# Patient Record
Sex: Female | Born: 1980 | Race: White | Hispanic: No | Marital: Married | Smoking: Former smoker
Health system: Southern US, Community
[De-identification: ages and names within clinical notes are randomized; demographics above are authoritative.]

## PROBLEM LIST (undated history)

## (undated) DIAGNOSIS — M1990 Unspecified osteoarthritis, unspecified site: Secondary | ICD-10-CM

## (undated) DIAGNOSIS — Z8759 Personal history of other complications of pregnancy, childbirth and the puerperium: Secondary | ICD-10-CM

## (undated) DIAGNOSIS — Z9889 Other specified postprocedural states: Secondary | ICD-10-CM

## (undated) DIAGNOSIS — Z789 Other specified health status: Secondary | ICD-10-CM

## (undated) DIAGNOSIS — M069 Rheumatoid arthritis, unspecified: Secondary | ICD-10-CM

## (undated) DIAGNOSIS — Z9079 Acquired absence of other genital organ(s): Secondary | ICD-10-CM

## (undated) DIAGNOSIS — K08409 Partial loss of teeth, unspecified cause, unspecified class: Secondary | ICD-10-CM

## (undated) HISTORY — PX: WISDOM TOOTH EXTRACTION: SHX21

## (undated) HISTORY — DX: Unspecified osteoarthritis, unspecified site: M19.90

---

## 2000-02-07 ENCOUNTER — Other Ambulatory Visit: Admission: RE | Admit: 2000-02-07 | Discharge: 2000-02-07 | Payer: Self-pay | Admitting: Obstetrics and Gynecology

## 2000-05-25 ENCOUNTER — Emergency Department (HOSPITAL_COMMUNITY): Admission: EM | Admit: 2000-05-25 | Discharge: 2000-05-25 | Payer: Self-pay | Admitting: Emergency Medicine

## 2001-01-08 ENCOUNTER — Other Ambulatory Visit: Admission: RE | Admit: 2001-01-08 | Discharge: 2001-01-08 | Payer: Self-pay | Admitting: Obstetrics & Gynecology

## 2001-08-06 ENCOUNTER — Encounter (INDEPENDENT_AMBULATORY_CARE_PROVIDER_SITE_OTHER): Payer: Self-pay

## 2001-08-06 ENCOUNTER — Inpatient Hospital Stay (HOSPITAL_COMMUNITY): Admission: AD | Admit: 2001-08-06 | Discharge: 2001-08-08 | Payer: Self-pay | Admitting: Obstetrics and Gynecology

## 2002-01-02 ENCOUNTER — Other Ambulatory Visit: Admission: RE | Admit: 2002-01-02 | Discharge: 2002-01-02 | Payer: Self-pay | Admitting: Obstetrics and Gynecology

## 2003-07-29 ENCOUNTER — Other Ambulatory Visit: Admission: RE | Admit: 2003-07-29 | Discharge: 2003-07-29 | Payer: Self-pay | Admitting: Obstetrics & Gynecology

## 2004-08-01 ENCOUNTER — Other Ambulatory Visit: Admission: RE | Admit: 2004-08-01 | Discharge: 2004-08-01 | Payer: Self-pay | Admitting: Obstetrics & Gynecology

## 2005-08-11 ENCOUNTER — Other Ambulatory Visit: Admission: RE | Admit: 2005-08-11 | Discharge: 2005-08-11 | Payer: Self-pay | Admitting: Obstetrics & Gynecology

## 2006-07-03 ENCOUNTER — Ambulatory Visit: Payer: Self-pay | Admitting: Internal Medicine

## 2009-07-22 ENCOUNTER — Inpatient Hospital Stay (HOSPITAL_COMMUNITY): Admission: RE | Admit: 2009-07-22 | Discharge: 2009-07-24 | Payer: Self-pay | Admitting: Obstetrics & Gynecology

## 2011-01-26 LAB — CBC
HCT: 29 % — ABNORMAL LOW (ref 36.0–46.0)
Hemoglobin: 10 g/dL — ABNORMAL LOW (ref 12.0–15.0)
MCHC: 34.4 g/dL (ref 30.0–36.0)
RBC: 3.1 MIL/uL — ABNORMAL LOW (ref 3.87–5.11)

## 2011-01-26 LAB — CCBB MATERNAL DONOR DRAW

## 2011-01-27 LAB — CBC
Hemoglobin: 12.1 g/dL (ref 12.0–15.0)
MCHC: 34 g/dL (ref 30.0–36.0)
MCV: 92.9 fL (ref 78.0–100.0)
RBC: 3.85 MIL/uL — ABNORMAL LOW (ref 3.87–5.11)

## 2011-03-10 NOTE — Assessment & Plan Note (Signed)
[facility]                          [place] OFFICE NOTE   NAME:Gorley, Berenice Primas                      MRN:          [id number]  DATE:07/03/2006                            DOB:          Feb 10, 1981    CHIEF COMPLAINT:  Fatigue.   HISTORY OF PRESENT ILLNESS:  Mrs. Phillippi is a 30 year old female who came  to the office because she has been feeling dizzy, fatigued, shaky, and she  feels that this is probably from low blood sugar.  From time to time, her  chest feels tight, but she denies any chest pain or shortness of breath.  Most of her symptoms were more noticeable when she is fasting, and they  somehow resolve after she eats.  She has also noticed some nausea for two  weeks but without vomiting, abdominal  pain, diarrhea.  She  is not taking  any NSAIDS over the counter.  Urine pregnancy test today was negative.  On  further questioning, she believes that she has been feeling this way as far  as she can remember, but only in the last two weeks when the symptoms were a  little worse, her husband asked her to come here to get evaluated.  She is  G1, P1, no other medical conditions.   PAST MEDICAL HISTORY:  1. The patient denies any history of colon cancer, breast cancer or heart      attacks.  2. Grandmother has diabetes.   SOCIAL HISTORY:  The patient does not smoke, drink.  She is married, has a 14-  year-old daughter.   REVIEW OF SYSTEMS:  Per the history of the present illness, in addition to  that she does not cough.  She does have headaches from time to time but  nothing intense.  Her stress level is unchanged from the baseline.  Her  periods are normal.   MEDICATIONS:  Birth control pills.   ALLERGIES:  NO KNOWN DRUG ALLERGIES.   PHYSICAL EXAMINATION:  GENERAL:  The patient is alert, oriented, in no  apparent distress.  She is 5 feet 4 inches and 1/4 tall.  VITAL SIGNS:  She weighs 132 pounds, blood pressure 108/70.  NECK:   No thyromegaly.  LUNGS:  Clear to auscultation.  CARDIOVASCULAR:  Regular rate and rhythm without a murmur.  ABDOMEN:  Not distended, soft, good bowel sounds, no organomegaly.  EXTREMITIES:  No edema.  NEUROLOGICAL:  Speech, gait and motor are intact.   LABORATORY AND X-RAYS:  Pregnancy test is negative, blood pressure 91.   ASSESSMENT/PLAN:  Mrs. Strother presents today with very vague symptoms of  fatigue, dizziness and also shakiness.  Her sugar today is normal.  Hypoglycemia is uncommon.  It certainly could explain some of her symptoms.  At this point; however, I recommend her to check a TSH, CBC and a  comprehensive panel.  If all of that is abnormal, we should wait a couple of  weeks and see how she feels.  If she continues to feeling poorly, she is to  let me know.  My next step could be to give her a  glucometer and try to  recommend hypoglycemia when she has the symptoms.  She seems to understand  my plan.                                   Willow Ora, MD   JP/MedQ  DD:  07/03/2006  DT:  07/04/2006  Job #:  [id number]

## 2011-03-10 NOTE — Discharge Summary (Signed)
[facility]  Patient:    Dawn [name], Dawn [name] Visit Number: [id number] MRN: [id number]          Service Type: OBS Location: 910A [id number] Attending Physician:  Melony Overly Admit Date:  08/06/2001 Discharge Date: 08/08/2001                             Discharge Summary  DISCHARGE DIAGNOSES:          1. Term pregnancy.                               2. Delivered 7 pound 14 ounce female infant                                  Apgars 9 and 9.                               3. Blood type O positive.                               4. Intrapartum fever.  PROCEDURE:                    1. Normal spontaneous vaginal delivery.                               2. Episiotomy with partial third degree                                  extension and repair.                               3. Intrapartum ampicillin.  HISTORY OF PRESENT ILLNESS:   This primigravida 30 year old female was admitted at term in early labor.  At the time of admission her cervix was 4 cm dilated with a vertex at -1 station.  HOSPITAL COURSE:              Amniotomy was carried out with production of clear fluid and the patient went into labor.  She did have an intrapartum temperature elevation to 100.5 and received ampicillin during labor.  She subsequently had a normal spontaneous vaginal delivery of a viable female infant weighing 7 pounds 14 ounces over a midline episiotomy that partially extended to a third degree.  Apgars were 9 and 9.  Episiotomy was repaired and subsequent postpartum course was totally benign.  On the morning of October 17, she was ambulating well, tolerating a regular diet well, was afebrile. Her Her discomfort was controlled with ibuprofen.  Her breastfeeding was going well and she was discharged to home. Discharge hemoglobin was 10.4 with a white count of 11,700, and platelet count 189,000.  She was given all appropriate instructions at the time of discharge and  understood all instructions well.  DISCHARGE MEDICATIONS:        1. Ferrous sulfate 300 mg daily.  2. Continue prenatal vitamins one a day as                                  long as she is breastfeeding.                               3. Ibuprofen for discomfort.  FOLLOW-UP:                    She will return to the office in four weeks for follow-up.  CONDITION ON DISCHARGE:       Improved. Attending Physician:  Melony Overly DD:  08/08/01 TD:  08/09/01 Job: [id number]

## 2013-05-02 ENCOUNTER — Inpatient Hospital Stay (HOSPITAL_COMMUNITY)
Admission: AD | Admit: 2013-05-02 | Discharge: 2013-05-02 | Disposition: A | Discharge status: Home / Self Care | Payer: BC Managed Care – PPO | Source: Ambulatory Visit | Attending: Obstetrics and Gynecology | Admitting: Obstetrics and Gynecology

## 2013-05-02 ENCOUNTER — Encounter (HOSPITAL_COMMUNITY): Payer: Self-pay | Admitting: Family

## 2013-05-02 ENCOUNTER — Inpatient Hospital Stay (HOSPITAL_COMMUNITY): Payer: BC Managed Care – PPO

## 2013-05-02 DIAGNOSIS — O4690 Antepartum hemorrhage, unspecified, unspecified trimester: Secondary | ICD-10-CM

## 2013-05-02 DIAGNOSIS — R109 Unspecified abdominal pain: Secondary | ICD-10-CM | POA: Insufficient documentation

## 2013-05-02 DIAGNOSIS — O209 Hemorrhage in early pregnancy, unspecified: Secondary | ICD-10-CM

## 2013-05-02 HISTORY — DX: Other specified health status: Z78.9

## 2013-05-02 LAB — CBC WITH DIFFERENTIAL/PLATELET
Eosinophils Absolute: 0.1 10*3/uL (ref 0.0–0.7)
Eosinophils Relative: 2 % (ref 0–5)
Hemoglobin: 12.7 g/dL (ref 12.0–15.0)
Lymphs Abs: 2.7 10*3/uL (ref 0.7–4.0)
MCH: 31.5 pg (ref 26.0–34.0)
MCV: 89.6 fL (ref 78.0–100.0)
Monocytes Absolute: 0.5 10*3/uL (ref 0.1–1.0)
Monocytes Relative: 7 % (ref 3–12)
RBC: 4.03 MIL/uL (ref 3.87–5.11)

## 2013-05-02 NOTE — MAU Note (Signed)
Patient presents to MAU to r/o ectopic per office visit today; reports hcg levels taken in office on Monday, Wednesday and again today. Reports brown spotting x 2 days.  Reports intermittent sharp pains on R lower abdomen.

## 2013-05-02 NOTE — MAU Provider Note (Signed)
History     CSN: [id number]  Arrival date and time: 05/02/13 2004   First Provider Initiated Contact with Patient 05/02/13 2044      Chief Complaint  Patient presents with  . Abdominal Pain   HPI [name] is a 32 y.o. G3P2002 at [redacted]w[redacted]d who was sent to MAU today for possible ectopic pregnancy. The patient was seen in the office on Monday and quant hCG was ~2300 per patient. The patient reports and [place] that day that showed nothing in the uterus. The patient then returned to the office Wednesday and had quant hCG of 1800 and today it was 2600. She has had brown spotting x 2 days and off and on lower abdominal cramping. She rates her pain at 4/10 at the worst and has no pain now. She denies N/V, fever, dizziness or UTI symptoms. She has had more frequent BMs over the last few days.   OB History   Grav Para Term Preterm Abortions TAB SAB Ect Mult Living   3 2 2       2       Past Medical History  Diagnosis Date  . Medical history non-contributory     Past Surgical History  Procedure Laterality Date  . Wisdom tooth extraction      History reviewed. No pertinent family history.  History  Substance Use Topics  . Smoking status: Never Smoker   . Smokeless tobacco: Never Used  . Alcohol Use: No    Allergies: No Known Allergies  No prescriptions prior to admission    Review of Systems  Constitutional: Negative for fever and malaise/fatigue.  Gastrointestinal: Positive for abdominal pain and diarrhea. Negative for nausea, vomiting and constipation.  Genitourinary: Negative for dysuria, urgency and frequency.       + vaginal bleeding  Neurological: Negative for dizziness, loss of consciousness and weakness.   Physical Exam   Blood pressure 104/52, pulse 77, temperature 98.4 F (36.9 C), temperature source Oral, resp. rate 16, height 5\' 5"  (1.651 m), weight 141 lb 9.6 oz (64.229 kg), last menstrual period 03/28/2013.  Physical Exam  Constitutional: She is oriented  to person, place, and time. She appears well-developed and well-nourished. No distress.  HENT:  Head: Normocephalic and atraumatic.  Cardiovascular: Normal rate, regular rhythm and normal heart sounds.   Respiratory: Effort normal and breath sounds normal. No respiratory distress.  GI: Soft. Bowel sounds are normal. She exhibits no distension and no mass. There is no tenderness. There is no rebound and no guarding.  Neurological: She is alert and oriented to person, place, and time.  Skin: Skin is warm and dry. No erythema.  Psychiatric: She has a normal mood and affect.   Results for orders placed during the hospital encounter of 05/02/13 (from the past 24 hour(s))  POCT PREGNANCY, URINE     Status: Abnormal   Collection Time    05/02/13  8:29 PM      Result Value Range   Preg Test, Ur POSITIVE (*) NEGATIVE  HCG, QUANTITATIVE, PREGNANCY     Status: Abnormal   Collection Time    05/02/13  9:54 PM      Result Value Range   hCG, Beta Chain, Quant, S 1979 (*) <5 mIU/mL  CBC WITH DIFFERENTIAL     Status: None   Collection Time    05/02/13  9:54 PM      Result Value Range   WBC 7.1  4.0 - 10.5 K/uL   RBC 4.03  3.87 - 5.11 MIL/uL   Hemoglobin 12.7  12.0 - 15.0 g/dL   HCT 16.1  09.6 - 04.5 %   MCV 89.6  78.0 - 100.0 fL   MCH 31.5  26.0 - 34.0 pg   MCHC 35.2  30.0 - 36.0 g/dL   RDW 40.9  81.1 - 91.4 %   Platelets 196  150 - 400 K/uL   Neutrophils Relative % 54  43 - 77 %   Neutro Abs 3.8  1.7 - 7.7 K/uL   Lymphocytes Relative 38  12 - 46 %   Lymphs Abs 2.7  0.7 - 4.0 K/uL   Monocytes Relative 7  3 - 12 %   Monocytes Absolute 0.5  0.1 - 1.0 K/uL   Eosinophils Relative 2  0 - 5 %   Eosinophils Absolute 0.1  0.0 - 0.7 K/uL   Basophils Relative 0  0 - 1 %   Basophils Absolute 0.0  0.0 - 0.1 K/uL  TYPE AND SCREEN     Status: None   Collection Time    05/02/13 10:25 PM      Result Value Range   ABO/RH(D) O POS     Antibody Screen NEG     Sample Expiration 05/05/2013      MAU  Course  Procedures None  MDM +UPT Discussed patient with Dr. Dareen Piano. Order all labs needed for possible MTX and get [place].  Discussed [place] results with Dr. Dareen Piano. Bleeding precautions and follow-up in the office on Wednesday Discussed [place] results at length with patient and spouse. They are understanding of the possible outcomes and will follow-up as recommended. Patient is informed of warning signs for miscarriage.  Assessment and Plan  A: Vaginal bleeding in pregnancy, first trimester  P: Discharge home Bleeding precautions discussed Patient encouraged to call Nestor Ramp on Monday to make an appointment for Wednesday for follow-up Patient may return to MAU as needed or if her condition were to change or worsen  Freddi Starr, PA-C  05/02/2013, 11:15 PM

## 2013-05-05 ENCOUNTER — Other Ambulatory Visit (HOSPITAL_COMMUNITY)
Admission: RE | Admit: 2013-05-05 | Discharge: 2013-05-05 | Disposition: A | Discharge status: Home / Self Care | Payer: BC Managed Care – PPO | Source: Ambulatory Visit | Attending: Obstetrics & Gynecology | Admitting: Obstetrics & Gynecology

## 2013-05-05 ENCOUNTER — Other Ambulatory Visit: Payer: Self-pay

## 2013-05-05 DIAGNOSIS — Z8759 Personal history of other complications of pregnancy, childbirth and the puerperium: Secondary | ICD-10-CM

## 2013-05-05 DIAGNOSIS — O00109 Unspecified tubal pregnancy without intrauterine pregnancy: Secondary | ICD-10-CM | POA: Insufficient documentation

## 2013-05-05 DIAGNOSIS — Z9079 Acquired absence of other genital organ(s): Secondary | ICD-10-CM

## 2013-05-05 DIAGNOSIS — Z9889 Other specified postprocedural states: Secondary | ICD-10-CM

## 2013-05-05 HISTORY — DX: Personal history of other complications of pregnancy, childbirth and the puerperium: Z87.59

## 2013-05-05 HISTORY — DX: Acquired absence of other genital organ(s): Z90.79

## 2013-05-05 HISTORY — DX: Other specified postprocedural states: Z98.890

## 2013-05-06 ENCOUNTER — Encounter (HOSPITAL_COMMUNITY): Payer: Self-pay | Admitting: *Deleted

## 2013-05-06 ENCOUNTER — Inpatient Hospital Stay (HOSPITAL_COMMUNITY)
Admission: AD | Admit: 2013-05-06 | Discharge: 2013-05-06 | Disposition: A | Discharge status: Home / Self Care | Payer: BC Managed Care – PPO | Source: Ambulatory Visit | Attending: Obstetrics & Gynecology | Admitting: Obstetrics & Gynecology

## 2013-05-06 ENCOUNTER — Inpatient Hospital Stay (HOSPITAL_COMMUNITY): Payer: BC Managed Care – PPO

## 2013-05-06 ENCOUNTER — Inpatient Hospital Stay (HOSPITAL_COMMUNITY): Payer: BC Managed Care – PPO | Admitting: Anesthesiology

## 2013-05-06 ENCOUNTER — Encounter (HOSPITAL_COMMUNITY): Payer: Self-pay | Admitting: Pharmacist

## 2013-05-06 ENCOUNTER — Encounter (HOSPITAL_COMMUNITY): Payer: Self-pay | Admitting: Anesthesiology

## 2013-05-06 ENCOUNTER — Encounter (HOSPITAL_COMMUNITY)
Admission: AD | Disposition: A | Discharge status: Home / Self Care | Payer: Self-pay | Source: Ambulatory Visit | Attending: Obstetrics & Gynecology

## 2013-05-06 DIAGNOSIS — K661 Hemoperitoneum: Secondary | ICD-10-CM | POA: Insufficient documentation

## 2013-05-06 DIAGNOSIS — O00109 Unspecified tubal pregnancy without intrauterine pregnancy: Secondary | ICD-10-CM | POA: Insufficient documentation

## 2013-05-06 HISTORY — PX: LAPAROSCOPY: SHX197

## 2013-05-06 LAB — CBC
HCT: 30.5 % — ABNORMAL LOW (ref 36.0–46.0)
MCV: 90.8 fL (ref 78.0–100.0)
Platelets: 163 10*3/uL (ref 150–400)
RBC: 3.36 MIL/uL — ABNORMAL LOW (ref 3.87–5.11)
WBC: 7.8 10*3/uL (ref 4.0–10.5)

## 2013-05-06 LAB — TYPE AND SCREEN

## 2013-05-06 SURGERY — LAPAROSCOPY OPERATIVE
Anesthesia: General | Site: Abdomen | Wound class: Clean Contaminated

## 2013-05-06 MED ORDER — ROCURONIUM BROMIDE 50 MG/5ML IV SOLN
INTRAVENOUS | Status: AC
  Filled 2013-05-06: qty 1

## 2013-05-06 MED ORDER — KETOROLAC TROMETHAMINE 30 MG/ML IJ SOLN
INTRAMUSCULAR | Status: AC
  Filled 2013-05-06: qty 1

## 2013-05-06 MED ORDER — PROMETHAZINE HCL 25 MG/ML IJ SOLN: 6.2500 mg | INTRAMUSCULAR | Status: DC | PRN

## 2013-05-06 MED ORDER — GLYCOPYRROLATE 0.2 MG/ML IJ SOLN
INTRAMUSCULAR | Status: DC | PRN
  Administered 2013-05-06: 0.6 mg via INTRAVENOUS

## 2013-05-06 MED ORDER — MIDAZOLAM HCL 5 MG/5ML IJ SOLN
INTRAMUSCULAR | Status: DC | PRN
  Administered 2013-05-06: 2 mg via INTRAVENOUS

## 2013-05-06 MED ORDER — PROPOFOL 10 MG/ML IV EMUL
INTRAVENOUS | Status: AC
  Filled 2013-05-06: qty 20

## 2013-05-06 MED ORDER — DEXTROSE IN LACTATED RINGERS 5 % IV SOLN: Freq: Once | INTRAVENOUS | Status: DC

## 2013-05-06 MED ORDER — NEOSTIGMINE METHYLSULFATE 1 MG/ML IJ SOLN
INTRAMUSCULAR | Status: AC
  Filled 2013-05-06: qty 1

## 2013-05-06 MED ORDER — KETOROLAC TROMETHAMINE 30 MG/ML IJ SOLN
INTRAMUSCULAR | Status: DC | PRN
  Administered 2013-05-06: 30 mg via INTRAVENOUS

## 2013-05-06 MED ORDER — LIDOCAINE HCL (CARDIAC) 20 MG/ML IV SOLN
INTRAVENOUS | Status: AC
  Filled 2013-05-06: qty 5

## 2013-05-06 MED ORDER — PROMETHAZINE HCL 25 MG/ML IJ SOLN
INTRAMUSCULAR | Status: AC
  Filled 2013-05-06: qty 1

## 2013-05-06 MED ORDER — LIDOCAINE HCL (CARDIAC) 20 MG/ML IV SOLN
INTRAVENOUS | Status: DC | PRN
  Administered 2013-05-06: 40 mg via INTRAVENOUS

## 2013-05-06 MED ORDER — LACTATED RINGERS IR SOLN
Status: DC | PRN
  Administered 2013-05-06: 3000 mL

## 2013-05-06 MED ORDER — MIDAZOLAM HCL 2 MG/2ML IJ SOLN
INTRAMUSCULAR | Status: AC
  Filled 2013-05-06: qty 2

## 2013-05-06 MED ORDER — NEOSTIGMINE METHYLSULFATE 1 MG/ML IJ SOLN
INTRAMUSCULAR | Status: DC | PRN
  Administered 2013-05-06: 3 mg via INTRAVENOUS

## 2013-05-06 MED ORDER — ONDANSETRON HCL 4 MG/2ML IJ SOLN
INTRAMUSCULAR | Status: AC
  Filled 2013-05-06: qty 2

## 2013-05-06 MED ORDER — GLYCOPYRROLATE 0.2 MG/ML IJ SOLN
INTRAMUSCULAR | Status: AC
  Filled 2013-05-06: qty 3

## 2013-05-06 MED ORDER — FENTANYL CITRATE 0.05 MG/ML IJ SOLN
INTRAMUSCULAR | Status: AC
  Filled 2013-05-06: qty 5

## 2013-05-06 MED ORDER — OXYCODONE-ACETAMINOPHEN 5-325 MG PO TABS: 1.0000 | ORAL_TABLET | ORAL | Status: DC | PRN

## 2013-05-06 MED ORDER — PHENYLEPHRINE HCL 10 MG/ML IJ SOLN
INTRAMUSCULAR | Status: DC | PRN
  Administered 2013-05-06: .04 mg via INTRAVENOUS
  Administered 2013-05-06 (×2): .08 mg via INTRAVENOUS

## 2013-05-06 MED ORDER — FENTANYL CITRATE 0.05 MG/ML IJ SOLN
25.0000 ug | INTRAMUSCULAR | Status: DC | PRN
  Administered 2013-05-06: 50 ug via INTRAVENOUS

## 2013-05-06 MED ORDER — PHENYLEPHRINE 40 MCG/ML (10ML) SYRINGE FOR IV PUSH (FOR BLOOD PRESSURE SUPPORT)
PREFILLED_SYRINGE | INTRAVENOUS | Status: AC
  Filled 2013-05-06: qty 5

## 2013-05-06 MED ORDER — LACTATED RINGERS IV SOLN
INTRAVENOUS | Status: DC | PRN
  Administered 2013-05-06 (×2): via INTRAVENOUS

## 2013-05-06 MED ORDER — ONDANSETRON HCL 4 MG/2ML IJ SOLN
INTRAMUSCULAR | Status: DC | PRN
  Administered 2013-05-06: 4 mg via INTRAVENOUS

## 2013-05-06 MED ORDER — PROPOFOL 10 MG/ML IV BOLUS
INTRAVENOUS | Status: DC | PRN
  Administered 2013-05-06: 160 mg via INTRAVENOUS

## 2013-05-06 MED ORDER — DEXAMETHASONE SODIUM PHOSPHATE 10 MG/ML IJ SOLN
INTRAMUSCULAR | Status: DC | PRN
  Administered 2013-05-06: 10 mg via INTRAVENOUS

## 2013-05-06 MED ORDER — CITRIC ACID-SODIUM CITRATE 334-500 MG/5ML PO SOLN
30.0000 mL | Freq: Once | ORAL | Status: AC
  Administered 2013-05-06: 30 mL via ORAL
  Filled 2013-05-06: qty 15

## 2013-05-06 MED ORDER — FENTANYL CITRATE 0.05 MG/ML IJ SOLN
INTRAMUSCULAR | Status: AC
  Administered 2013-05-06: 50 ug via INTRAVENOUS
  Filled 2013-05-06: qty 2

## 2013-05-06 MED ORDER — ROCURONIUM BROMIDE 100 MG/10ML IV SOLN
INTRAVENOUS | Status: DC | PRN
  Administered 2013-05-06: 40 mg via INTRAVENOUS
  Administered 2013-05-06: 10 mg via INTRAVENOUS

## 2013-05-06 MED ORDER — FENTANYL CITRATE 0.05 MG/ML IJ SOLN
INTRAMUSCULAR | Status: DC | PRN
  Administered 2013-05-06: 100 ug via INTRAVENOUS
  Administered 2013-05-06: 50 ug via INTRAVENOUS
  Administered 2013-05-06: 100 ug via INTRAVENOUS

## 2013-05-06 SURGICAL SUPPLY — 25 items
ADH SKN CLS APL DERMABOND .7 (GAUZE/BANDAGES/DRESSINGS) ×2
BAG SPEC RTRVL LRG 6X4 10 (ENDOMECHANICALS) ×1
CABLE HIGH FREQUENCY MONO STRZ (ELECTRODE) IMPLANT
CATH ROBINSON RED A/P 16FR (CATHETERS) ×2 IMPLANT
CLOTH BEACON ORANGE TIMEOUT ST (SAFETY) ×2 IMPLANT
DERMABOND ADVANCED (GAUZE/BANDAGES/DRESSINGS) ×2
DERMABOND ADVANCED .7 DNX12 (GAUZE/BANDAGES/DRESSINGS) IMPLANT
FORCEPS CUTTING 33CM 5MM (CUTTING FORCEPS) ×2 IMPLANT
FORCEPS CUTTING 45CM 5MM (CUTTING FORCEPS) IMPLANT
GLOVE ECLIPSE 6.0 STRL STRAW (GLOVE) ×2 IMPLANT
GLOVE ECLIPSE 6.5 STRL STRAW (GLOVE) ×2 IMPLANT
GOWN PREVENTION PLUS LG XLONG (DISPOSABLE) ×4 IMPLANT
NS IRRIG 1000ML POUR BTL (IV SOLUTION) ×1 IMPLANT
PACK LAPAROSCOPY BASIN (CUSTOM PROCEDURE TRAY) ×2 IMPLANT
POUCH SPECIMEN RETRIEVAL 10MM (ENDOMECHANICALS) ×1 IMPLANT
PROTECTOR NERVE ULNAR (MISCELLANEOUS) ×2 IMPLANT
RINGERS IRRIG 1000ML POUR BTL (IV SOLUTION) IMPLANT
SET IRRIG TUBING LAPAROSCOPIC (IRRIGATION / IRRIGATOR) ×1 IMPLANT
SOLUTION ELECTROLUBE (MISCELLANEOUS) IMPLANT
SUT VICRYL 0 ENDOLOOP (SUTURE) IMPLANT
SUT VICRYL 0 UR6 27IN ABS (SUTURE) ×2 IMPLANT
SUT VICRYL 4-0 PS2 18IN ABS (SUTURE) ×2 IMPLANT
TOWEL OR 17X24 6PK STRL BLUE (TOWEL DISPOSABLE) ×4 IMPLANT
TROCAR BALLN 12MMX100 BLUNT (TROCAR) ×1 IMPLANT
WATER STERILE IRR 1000ML POUR (IV SOLUTION) ×1 IMPLANT

## 2013-05-06 NOTE — Anesthesia Postprocedure Evaluation (Signed)
  Anesthesia Post Note  Patient: Dawn [name]  Procedure(s) Performed: Procedure(s) (LRB): LAPAROSCOPY OPERATIVE, Left Salpingectomy, Removal Ectopic Pregnancy (N/A)  Anesthesia type: GA  Patient location: PACU  Post pain: Pain level controlled  Post assessment: Post-op Vital signs reviewed  Last Vitals:  Filed Vitals:   05/06/13 0700  BP: 102/54  Pulse: 83  Temp: 37.1 C  Resp: 14    Post vital signs: Reviewed  Level of consciousness: sedated  Complications: No apparent anesthesia complications

## 2013-05-06 NOTE — Anesthesia Preprocedure Evaluation (Addendum)
Anesthesia Evaluation  Patient identified by MRN, date of birth, ID band Patient awake    Reviewed: Allergy & Precautions, H&P , Patient's Chart, lab work & pertinent test results, reviewed documented beta blocker date and time   Airway Mallampati: II TM Distance: >3 FB Neck ROM: full    Dental no notable dental hx.    Pulmonary  breath sounds clear to auscultation  Pulmonary exam normal       Cardiovascular Rhythm:regular Rate:Normal     Neuro/Psych    GI/Hepatic   Endo/Other    Renal/GU      Musculoskeletal   Abdominal   Peds  Hematology   Anesthesia Other Findings   Reproductive/Obstetrics                           Anesthesia Physical Anesthesia Plan  ASA: II and emergent  Anesthesia Plan: General   Post-op Pain Management:    Induction: Intravenous and Cricoid pressure planned  Airway Management Planned: Oral ETT  Additional Equipment:   Intra-op Plan:   Post-operative Plan:   Informed Consent: I have reviewed the patients History and Physical, chart, labs and discussed the procedure including the risks, benefits and alternatives for the proposed anesthesia with the patient or authorized representative who has indicated his/her understanding and acceptance.   Dental Advisory Given and Dental advisory given  Plan Discussed with: CRNA and Surgeon  Anesthesia Plan Comments: (  Discussed  general anesthesia, including possible nausea, instrumentation of airway, sore throat,pulmonary aspiration, etc. I asked if the were any outstanding questions, or  concerns before we proceeded. )       Anesthesia Quick Evaluation

## 2013-05-06 NOTE — H&P (Signed)
See Note from Joseph Berkshire as complete H&P.  She presents with pain and syncopal episode.  Ultrasound confirms hemoperitoneum and hemoglobin has dropped 2 grams since Friday.  Office D&E to rule out missed ab showed only small amount of tissue (path pending).  Impression:  Ruptured ectopic pregnancy  Plan:  Left salpingectomy.

## 2013-05-06 NOTE — MAU Provider Note (Signed)
History     CSN: [id number]  Arrival date and time: 05/06/13 8295   First Provider Initiated Contact with Patient 05/06/13 0226      No chief complaint on file.  HPI [name] is a 32 y.o. G3P2002 at [redacted]w[redacted]d who presents to MAU today with complaint of lower abdominal pain and vaginal bleeding. The patient has been followed in the office and MAU over the past week or so for inappropriate rise in quant hCGs and possible ectopic pregnancy. She had a D&C in the office today and states that she felt fine afterwards. She states that pain started around 9pm today. She had some spotting prior to arrival and felt more bleeding start when she was put in a room today. The patient has felt faint and weak and did faint in the lobby upon arrival. She rates her pain now at 5/10 and 10/10 with palpation of the LLQ.   OB History   Grav Para Term Preterm Abortions TAB SAB Ect Mult Living   3 2 2       2       Past Medical History  Diagnosis Date  . Medical history non-contributory     Past Surgical History  Procedure Laterality Date  . Wisdom tooth extraction      No family history on file.  History  Substance Use Topics  . Smoking status: Never Smoker   . Smokeless tobacco: Never Used  . Alcohol Use: No    Allergies: No Known Allergies  Prescriptions prior to admission  Medication Sig Dispense Refill  . Multiple Vitamin (MULTIVITAMIN WITH MINERALS) TABS Take 1 tablet by mouth daily.      . polyethylene glycol (MIRALAX / GLYCOLAX) packet Take 17 g by mouth daily as needed (constipation).        Review of Systems  Constitutional: Positive for malaise/fatigue. Negative for fever.  Gastrointestinal: Positive for abdominal pain.  Genitourinary:       + vaginal bleeding  Musculoskeletal: Positive for back pain.  Neurological: Positive for weakness.   Physical Exam   Blood pressure 107/50, pulse 73, resp. rate 18, last menstrual period 03/28/2013, SpO2 98.00%.  Physical Exam   Constitutional: She appears well-developed and well-nourished.  HENT:  Head: Normocephalic and atraumatic.  Cardiovascular: Normal rate, regular rhythm and normal heart sounds.   Respiratory: Effort normal and breath sounds normal. No respiratory distress.  GI: Soft. She exhibits no distension and no mass. There is tenderness (exsquisite tenderness to palpation of the LLQ ). There is no rebound and no guarding.  Neurological: She is alert.  Skin: Skin is warm and dry. No erythema. There is pallor.  Psychiatric: She has a normal mood and affect.   Results for orders placed during the hospital encounter of 05/06/13 (from the past 24 hour(s))  CBC     Status: Abnormal   Collection Time    05/06/13  2:40 AM      Result Value Range   WBC 7.8  4.0 - 10.5 K/uL   RBC 3.36 (*) 3.87 - 5.11 MIL/uL   Hemoglobin 10.7 (*) 12.0 - 15.0 g/dL   HCT 62.1 (*) 30.8 - 65.7 %   MCV 90.8  78.0 - 100.0 fL   MCH 31.8  26.0 - 34.0 pg   MCHC 35.1  30.0 - 36.0 g/dL   RDW 84.6  96.2 - 95.2 %   Platelets 163  150 - 400 K/uL    MAU Course  Procedures None  MDM Discussed  patient with Dr. Arlyce Dice. Order CBC and pelvic US and he will come to see patient. Concern is for ruptured ectopic pregnancy.  [place] report not yet available. Dr. Arlyce Dice present in MAU room during bedside US and able to view images Assessment and Plan  A: Ruptured ectopic pregnancy  P: Dr. Arlyce Dice to take patient to OR  Freddi Starr, PA-C  05/06/2013, 2:26 AM

## 2013-05-06 NOTE — Op Note (Signed)
Patient Name: Dawn [name] MRN: [id number]  Date of Surgery: 05/06/2013    PREOPERATIVE DIAGNOSIS: Ruptured left Ectopic  POSTOPERATIVE DIAGNOSIS: Ruptured left Ectopic   PROCEDURE: Laparoscopy left salpingectomy  SURGEON: Caralyn Guile. Arlyce Dice M.D.  ANESTHESIA: General endotracheal  ESTIMATED BLOOD LOSS: <50 ml (200-500 ml in peritoneal cavity)  FINDINGS: Hemoperitoneum.  Normal ovaries.  Normal right fallopian tube.  Left tube appeared to have ruptured near fimbria.  POC not clearly identified in tube or in pelvis.   COMPLICATIONS: None  INDICATIONS: Left ectopic pregnancy with hemoperitoneum.  PROCEDURE IN DETAIL:  The abdomen, vagina, and perineum were prepped and draped in sterile fashion.  The bladder was catheterized.  A Hulka tenaculum was placed into the endometrial canal and fixed to the anterior lip of the cervix.  The surgeon re- gowned and gloved.  An incision was made at the umbilicus and the subumbilical fascia was identified, opened, and a loose vicryl 1 purse string suture was placed in the fascia.  The peritoneum was opened and the Hasson canula was introduced.  The laparoscope was was placed and a pneumoperitoneum was created.  Two accessory 5 mm ports were placed in the right lower quadrant under direct visualization.  The left fallopian tube was grasped and elevated and the proximal tube was transected and the mesosalpinx cauterized and cut with the Gyrus.  An endobag placed through the umbilical port and using a 5 mm scope in the accessory port for visualization.   The pneumoperitoneum was released and the purse string suture was tied to close the fascia.  The umbilical incision was closed with subcuticular 4-0 vicryl.  The 5 mm incisions were closed with derma bond.  The Hulka tenaculum was removed.  All sponge and instrument counts were correct.  The patient tolerated the procedure well and left the operating room in good condition.  Aissata Wilmore D. Arlyce Dice, M.D.

## 2013-05-06 NOTE — Transfer of Care (Signed)
Immediate Anesthesia Transfer of Care Note  Patient: Dawn [name]  Procedure(s) Performed: Procedure(s): LAPAROSCOPY OPERATIVE, Left Salpingectomy, Removal Ectopic Pregnancy (N/A)  Patient Location: PACU  Anesthesia Type:General  Level of Consciousness: awake, alert , oriented and patient cooperative  Airway & Oxygen Therapy: Patient Spontanous Breathing  Post-op Assessment: Report [name] to PACU RN and Post -op Vital signs reviewed and stable  Post vital signs: Reviewed and stable  Complications: No apparent anesthesia complications

## 2013-05-07 ENCOUNTER — Encounter (HOSPITAL_COMMUNITY): Payer: Self-pay | Admitting: Obstetrics & Gynecology

## 2014-03-07 ENCOUNTER — Encounter (HOSPITAL_COMMUNITY): Payer: Self-pay | Admitting: *Deleted

## 2014-06-18 LAB — OB RESULTS CONSOLE GC/CHLAMYDIA
Chlamydia: NEGATIVE
Gonorrhea: NEGATIVE

## 2014-07-14 LAB — OB RESULTS CONSOLE RUBELLA ANTIBODY, IGM: Rubella: IMMUNE

## 2014-07-14 LAB — OB RESULTS CONSOLE HIV ANTIBODY (ROUTINE TESTING): HIV: NONREACTIVE

## 2014-07-14 LAB — OB RESULTS CONSOLE HEPATITIS B SURFACE ANTIGEN: Hepatitis B Surface Ag: NEGATIVE

## 2014-08-24 ENCOUNTER — Encounter (HOSPITAL_COMMUNITY): Payer: Self-pay | Admitting: *Deleted

## 2014-10-14 LAB — OB RESULTS CONSOLE RPR: RPR: NONREACTIVE

## 2014-10-23 NOTE — L&D Delivery Note (Signed)
Delivery Note At 3:21 PM a viable and healthy female was delivered via Vaginal, Spontaneous Delivery (Presentation: Middle Occiput Anterior).  APGAR: 10, 10; weight pending .   Placenta status: Intact, Spontaneous.  Cord: 3 vessels with the following complications: None.   Anesthesia: Epidural  Episiotomy: None Lacerations: Periurethral Suture Repair: 4-0 Est. Blood Loss (mL):  250 cc  Mom to postpartum.  Baby to Couplet care / Skin to Skin.  Rori Goar H. 01/13/2015, 3:49 PM

## 2014-12-23 LAB — OB RESULTS CONSOLE GBS: GBS: NEGATIVE

## 2015-01-13 ENCOUNTER — Inpatient Hospital Stay (HOSPITAL_COMMUNITY): Payer: BLUE CROSS/BLUE SHIELD | Admitting: Anesthesiology

## 2015-01-13 ENCOUNTER — Encounter (HOSPITAL_COMMUNITY): Payer: Self-pay | Admitting: Emergency Medicine

## 2015-01-13 ENCOUNTER — Inpatient Hospital Stay (HOSPITAL_COMMUNITY)
Admission: AD | Admit: 2015-01-13 | Discharge: 2015-01-14 | DRG: 775 | Disposition: A | Discharge status: Home / Self Care | Payer: BLUE CROSS/BLUE SHIELD | Source: Ambulatory Visit | Attending: Obstetrics and Gynecology | Admitting: Obstetrics and Gynecology

## 2015-01-13 DIAGNOSIS — Z3A39 39 weeks gestation of pregnancy: Secondary | ICD-10-CM | POA: Diagnosis present

## 2015-01-13 DIAGNOSIS — Z9889 Other specified postprocedural states: Secondary | ICD-10-CM | POA: Diagnosis present

## 2015-01-13 DIAGNOSIS — Z3483 Encounter for supervision of other normal pregnancy, third trimester: Secondary | ICD-10-CM | POA: Diagnosis present

## 2015-01-13 DIAGNOSIS — O09523 Supervision of elderly multigravida, third trimester: Secondary | ICD-10-CM | POA: Diagnosis not present

## 2015-01-13 DIAGNOSIS — Z3490 Encounter for supervision of normal pregnancy, unspecified, unspecified trimester: Secondary | ICD-10-CM

## 2015-01-13 HISTORY — DX: Other specified postprocedural states: Z98.890

## 2015-01-13 HISTORY — DX: Acquired absence of other genital organ(s): Z90.79

## 2015-01-13 HISTORY — DX: Personal history of other complications of pregnancy, childbirth and the puerperium: Z87.59

## 2015-01-13 HISTORY — DX: Partial loss of teeth, unspecified cause, unspecified class: K08.409

## 2015-01-13 LAB — CBC
HCT: 36.5 % (ref 36.0–46.0)
HEMOGLOBIN: 12.6 g/dL (ref 12.0–15.0)
MCH: 32.1 pg (ref 26.0–34.0)
MCHC: 34.5 g/dL (ref 30.0–36.0)
MCV: 92.9 fL (ref 78.0–100.0)
Platelets: 215 10*3/uL (ref 150–400)
RBC: 3.93 MIL/uL (ref 3.87–5.11)
RDW: 12.7 % (ref 11.5–15.5)
WBC: 12.1 10*3/uL — ABNORMAL HIGH (ref 4.0–10.5)

## 2015-01-13 LAB — TYPE AND SCREEN
ABO/RH(D): O POS
ANTIBODY SCREEN: NEGATIVE

## 2015-01-13 MED ORDER — OXYTOCIN 40 UNITS IN LACTATED RINGERS INFUSION - SIMPLE MED
62.5000 mL/h | INTRAVENOUS | Status: DC
  Administered 2015-01-13: 62.5 mL/h via INTRAVENOUS

## 2015-01-13 MED ORDER — LACTATED RINGERS IV SOLN: 500.0000 mL | INTRAVENOUS | Status: DC | PRN

## 2015-01-13 MED ORDER — LIDOCAINE HCL (PF) 1 % IJ SOLN
30.0000 mL | INTRAMUSCULAR | Status: DC | PRN
  Administered 2015-01-13: 30 mL via SUBCUTANEOUS
  Filled 2015-01-13: qty 30

## 2015-01-13 MED ORDER — CITRIC ACID-SODIUM CITRATE 334-500 MG/5ML PO SOLN: 30.0000 mL | ORAL | Status: DC | PRN

## 2015-01-13 MED ORDER — TETANUS-DIPHTH-ACELL PERTUSSIS 5-2.5-18.5 LF-MCG/0.5 IM SUSP
0.5000 mL | Freq: Once | INTRAMUSCULAR | Status: AC
  Administered 2015-01-14: 0.5 mL via INTRAMUSCULAR
  Filled 2015-01-13: qty 0.5

## 2015-01-13 MED ORDER — FENTANYL 2.5 MCG/ML BUPIVACAINE 1/10 % EPIDURAL INFUSION (WH - ANES)
14.0000 mL/h | INTRAMUSCULAR | Status: DC | PRN
  Administered 2015-01-13: 14 mL/h via EPIDURAL
  Filled 2015-01-13: qty 125

## 2015-01-13 MED ORDER — LACTATED RINGERS IV SOLN
500.0000 mL | Freq: Once | INTRAVENOUS | Status: AC
  Administered 2015-01-13: 500 mL via INTRAVENOUS

## 2015-01-13 MED ORDER — ONDANSETRON HCL 4 MG PO TABS: 4.0000 mg | ORAL_TABLET | ORAL | Status: DC | PRN

## 2015-01-13 MED ORDER — OXYCODONE-ACETAMINOPHEN 5-325 MG PO TABS
2.0000 | ORAL_TABLET | ORAL | Status: DC | PRN
Start: 2015-01-13 — End: 2015-01-13

## 2015-01-13 MED ORDER — OXYCODONE-ACETAMINOPHEN 5-325 MG PO TABS: 1.0000 | ORAL_TABLET | ORAL | Status: DC | PRN

## 2015-01-13 MED ORDER — ACETAMINOPHEN 325 MG PO TABS: 650.0000 mg | ORAL_TABLET | ORAL | Status: DC | PRN

## 2015-01-13 MED ORDER — SENNOSIDES-DOCUSATE SODIUM 8.6-50 MG PO TABS
2.0000 | ORAL_TABLET | ORAL | Status: DC
  Administered 2015-01-13: 2 via ORAL
  Filled 2015-01-13: qty 2

## 2015-01-13 MED ORDER — ZOLPIDEM TARTRATE 5 MG PO TABS: 5.0000 mg | ORAL_TABLET | Freq: Every evening | ORAL | Status: DC | PRN

## 2015-01-13 MED ORDER — DIBUCAINE 1 % RE OINT: 1.0000 "application " | TOPICAL_OINTMENT | RECTAL | Status: DC | PRN

## 2015-01-13 MED ORDER — DIPHENHYDRAMINE HCL 50 MG/ML IJ SOLN: 12.5000 mg | INTRAMUSCULAR | Status: DC | PRN

## 2015-01-13 MED ORDER — LACTATED RINGERS IV SOLN
INTRAVENOUS | Status: DC
  Administered 2015-01-13 (×2): 125 mL/h via INTRAVENOUS

## 2015-01-13 MED ORDER — DIPHENHYDRAMINE HCL 25 MG PO CAPS: 25.0000 mg | ORAL_CAPSULE | Freq: Four times a day (QID) | ORAL | Status: DC | PRN

## 2015-01-13 MED ORDER — PRENATAL MULTIVITAMIN CH
1.0000 | ORAL_TABLET | Freq: Every day | ORAL | Status: DC
  Filled 2015-01-13: qty 1

## 2015-01-13 MED ORDER — PHENYLEPHRINE 40 MCG/ML (10ML) SYRINGE FOR IV PUSH (FOR BLOOD PRESSURE SUPPORT)
80.0000 ug | PREFILLED_SYRINGE | INTRAVENOUS | Status: DC | PRN
  Filled 2015-01-13: qty 20

## 2015-01-13 MED ORDER — EPHEDRINE 5 MG/ML INJ: 10.0000 mg | INTRAVENOUS | Status: DC | PRN

## 2015-01-13 MED ORDER — FLEET ENEMA 7-19 GM/118ML RE ENEM: 1.0000 | ENEMA | RECTAL | Status: DC | PRN

## 2015-01-13 MED ORDER — TERBUTALINE SULFATE 1 MG/ML IJ SOLN: 0.2500 mg | Freq: Once | INTRAMUSCULAR | Status: DC | PRN

## 2015-01-13 MED ORDER — BENZOCAINE-MENTHOL 20-0.5 % EX AERO
1.0000 "application " | INHALATION_SPRAY | CUTANEOUS | Status: DC | PRN
  Administered 2015-01-13: 1 via TOPICAL
  Filled 2015-01-13: qty 56

## 2015-01-13 MED ORDER — METHYLERGONOVINE MALEATE 0.2 MG/ML IJ SOLN: 0.2000 mg | INTRAMUSCULAR | Status: DC | PRN

## 2015-01-13 MED ORDER — FENTANYL 2.5 MCG/ML BUPIVACAINE 1/10 % EPIDURAL INFUSION (WH - ANES)
INTRAMUSCULAR | Status: DC | PRN
  Administered 2015-01-13: 14 mL/h via EPIDURAL

## 2015-01-13 MED ORDER — ONDANSETRON HCL 4 MG/2ML IJ SOLN: 4.0000 mg | INTRAMUSCULAR | Status: DC | PRN

## 2015-01-13 MED ORDER — IBUPROFEN 600 MG PO TABS
600.0000 mg | ORAL_TABLET | Freq: Four times a day (QID) | ORAL | Status: DC
  Administered 2015-01-13 – 2015-01-14 (×4): 600 mg via ORAL
  Filled 2015-01-13 (×4): qty 1

## 2015-01-13 MED ORDER — SIMETHICONE 80 MG PO CHEW: 80.0000 mg | CHEWABLE_TABLET | ORAL | Status: DC | PRN

## 2015-01-13 MED ORDER — LANOLIN HYDROUS EX OINT: TOPICAL_OINTMENT | CUTANEOUS | Status: DC | PRN

## 2015-01-13 MED ORDER — OXYTOCIN BOLUS FROM INFUSION
500.0000 mL | INTRAVENOUS | Status: DC
  Administered 2015-01-13: 500 mL via INTRAVENOUS

## 2015-01-13 MED ORDER — BUTORPHANOL TARTRATE 1 MG/ML IJ SOLN
1.0000 mg | INTRAMUSCULAR | Status: DC | PRN
Start: 2015-01-13 — End: 2015-01-13

## 2015-01-13 MED ORDER — OXYCODONE-ACETAMINOPHEN 5-325 MG PO TABS: 2.0000 | ORAL_TABLET | ORAL | Status: DC | PRN

## 2015-01-13 MED ORDER — METHYLERGONOVINE MALEATE 0.2 MG PO TABS: 0.2000 mg | ORAL_TABLET | ORAL | Status: DC | PRN

## 2015-01-13 MED ORDER — ONDANSETRON HCL 4 MG/2ML IJ SOLN: 4.0000 mg | Freq: Four times a day (QID) | INTRAMUSCULAR | Status: DC | PRN

## 2015-01-13 MED ORDER — LIDOCAINE HCL (PF) 1 % IJ SOLN
INTRAMUSCULAR | Status: DC | PRN
  Administered 2015-01-13 (×2): 5 mL

## 2015-01-13 MED ORDER — LACTATED RINGERS IV SOLN
INTRAVENOUS | Status: DC
Start: 2015-01-13 — End: 2015-01-13

## 2015-01-13 MED ORDER — OXYTOCIN 40 UNITS IN LACTATED RINGERS INFUSION - SIMPLE MED
1.0000 m[IU]/min | INTRAVENOUS | Status: DC
  Administered 2015-01-13: 2 m[IU]/min via INTRAVENOUS
  Filled 2015-01-13: qty 1000

## 2015-01-13 MED ORDER — PHENYLEPHRINE 40 MCG/ML (10ML) SYRINGE FOR IV PUSH (FOR BLOOD PRESSURE SUPPORT): 80.0000 ug | PREFILLED_SYRINGE | INTRAVENOUS | Status: DC | PRN

## 2015-01-13 MED ORDER — WITCH HAZEL-GLYCERIN EX PADS: 1.0000 "application " | MEDICATED_PAD | CUTANEOUS | Status: DC | PRN

## 2015-01-13 NOTE — H&P (Signed)
Dawn [name] is a 34 y.o. female presenting for IOL  34 yo [id number] @ 39+1 presents for IOL for advanced cervical dilation. Pt recently moved about and hour away. In the office yesterday, she was 3-4 cm dilated. She has a h/o of progressing quickly in labor and was concerned about her distance from the hospital therefore she is being admitted for IOL. Her pregnancy has been uncomplicated to this point. History OB History    Gravida Para Term Preterm AB TAB SAB Ectopic Multiple Living   4 2 2       2      Past Medical History  Diagnosis Date  . Medical history non-contributory    Past Surgical History  Procedure Laterality Date  . Wisdom tooth extraction    . Laparoscopy N/A 05/06/2013    Procedure: LAPAROSCOPY OPERATIVE, Left Salpingectomy, Removal Ectopic Pregnancy;  Surgeon: 05/08/2013, MD;  Location: [facility];  Service: Gynecology;  Laterality: N/A;   Family History: family history is not on file. Social History:  reports that she has never smoked. She has never used smokeless tobacco. She reports that she does not drink alcohol or use illicit drugs.   Prenatal Transfer Tool  Maternal Diabetes: No, elevated 1 hr gtt, normal 3 hr Genetic Screening: Declined Maternal Ultrasounds/Referrals: Normal Fetal Ultrasounds or other Referrals:  None Maternal Substance Abuse:  No Significant Maternal Medications:  None Significant Maternal Lab Results:  None Other Comments:  None  ROS: as above    Blood pressure 106/62, pulse 92, temperature 98 F (36.7 C), temperature source Oral, height 5\' 6"  (1.676 m), weight 72.122 kg (159 lb), unknown if currently breastfeeding. Exam Physical Exam  Prenatal labs: ABO, Rh:  O+ Antibody:  Neg Rubella:  Immune RPR:   NR HBsAg:   Neg HIV:   NR GBS:   Neg  Assessment/Plan: 1) Admit 2) Pitocin augmentation, AROM when able 3) Epidural on request  Refoel Palladino H. 01/13/2015, 8:39 AM

## 2015-01-13 NOTE — Anesthesia Procedure Notes (Signed)
Epidural Patient location during procedure: OB Start time: 01/13/2015 11:05 AM  Staffing Anesthesiologist: Karie Schwalbe Performed by: anesthesiologist   Preanesthetic Checklist Completed: patient identified, site marked, surgical consent, pre-op evaluation, timeout performed, IV checked, risks and benefits discussed and monitors and equipment checked  Epidural Patient position: sitting Prep: site prepped and draped and DuraPrep Patient monitoring: continuous pulse ox and blood pressure Approach: midline Location: L3-L4 Injection technique: LOR saline  Needle:  Needle type: Tuohy  Needle gauge: 17 G Needle length: 9 cm and 9 Needle insertion depth: 6 cm Catheter type: closed end flexible Catheter size: 19 Gauge Catheter at skin depth: 10 cm Test dose: negative  Assessment Events: blood not aspirated, injection not painful, no injection resistance, negative IV test and no paresthesia  Additional Notes Patient identified. Risks/Benefits/Options discussed with patient including but not limited to bleeding, infection, nerve damage, paralysis, failed block, incomplete pain control, headache, blood pressure changes, nausea, vomiting, reactions to medication both or allergic, itching and postpartum back pain. Confirmed with bedside nurse the patient's most recent platelet count. Confirmed with patient that they are not currently taking any anticoagulation, have any bleeding history or any family history of bleeding disorders. Patient expressed understanding and wished to proceed. All questions were answered. Sterile technique was used throughout the entire procedure. Please see nursing notes for vital signs. Test dose was given through epidural catheter and negative prior to continuing to dose epidural or start infusion. Warning signs of high block given to the patient including shortness of breath, tingling/numbness in hands, complete motor block, or any concerning symptoms with instructions  to call for help. Patient was given instructions on fall risk and not to get out of bed. All questions and concerns addressed with instructions to call with any issues or inadequate analgesia.

## 2015-01-13 NOTE — Anesthesia Preprocedure Evaluation (Signed)
Anesthesia Evaluation  Patient identified by MRN, date of birth, ID band Patient awake    Reviewed: Allergy & Precautions, NPO status , Patient's Chart, lab work & pertinent test results  History of Anesthesia Complications Negative for: history of anesthetic complications  Airway Mallampati: II  TM Distance: >3 FB Neck ROM: Full    Dental no notable dental hx. (+) Dental Advisory Given   Pulmonary neg pulmonary ROS,  breath sounds clear to auscultation  Pulmonary exam normal       Cardiovascular negative cardio ROS  Rhythm:Regular Rate:Normal     Neuro/Psych negative neurological ROS  negative psych ROS   GI/Hepatic negative GI ROS, Neg liver ROS,   Endo/Other  negative endocrine ROS  Renal/GU negative Renal ROS  negative genitourinary   Musculoskeletal negative musculoskeletal ROS (+)   Abdominal   Peds negative pediatric ROS (+)  Hematology negative hematology ROS (+)   Anesthesia Other Findings   Reproductive/Obstetrics (+) Pregnancy                             Anesthesia Physical Anesthesia Plan  ASA: II  Anesthesia Plan: Epidural   Post-op Pain Management:    Induction:   Airway Management Planned:   Additional Equipment:   Intra-op Plan:   Post-operative Plan:   Informed Consent: I have reviewed the patients History and Physical, chart, labs and discussed the procedure including the risks, benefits and alternatives for the proposed anesthesia with the patient or authorized representative who has indicated his/her understanding and acceptance.   Dental advisory given  Plan Discussed with:   Anesthesia Plan Comments:         Anesthesia Quick Evaluation  

## 2015-01-14 LAB — CBC
HEMATOCRIT: 33.1 % — AB (ref 36.0–46.0)
Hemoglobin: 11.5 g/dL — ABNORMAL LOW (ref 12.0–15.0)
MCH: 32.4 pg (ref 26.0–34.0)
MCHC: 34.7 g/dL (ref 30.0–36.0)
MCV: 93.2 fL (ref 78.0–100.0)
PLATELETS: 191 10*3/uL (ref 150–400)
RBC: 3.55 MIL/uL — AB (ref 3.87–5.11)
RDW: 12.5 % (ref 11.5–15.5)
WBC: 12.6 10*3/uL — AB (ref 4.0–10.5)

## 2015-01-14 LAB — RPR: RPR Ser Ql: NONREACTIVE

## 2015-01-14 NOTE — Anesthesia Postprocedure Evaluation (Signed)
Anesthesia Post Note  Patient: Dawn [name]  Procedure(s) Performed: * No procedures listed *  Anesthesia type: Epidural  Patient location: Mother/Baby  Post pain: Pain level controlled  Post assessment: Post-op Vital signs reviewed  Last Vitals:  Filed Vitals:   01/13/15 2235  BP: 106/50  Pulse: 82  Temp: 36.8 C  Resp: 18    Post vital signs: Reviewed  Level of consciousness:alert  Complications: No apparent anesthesia complications

## 2015-01-14 NOTE — Progress Notes (Signed)
Patient is eating, ambulating, voiding.  Pain control is good.  Filed Vitals:   01/13/15 1645 01/13/15 1713 01/13/15 1830 01/13/15 2235  BP: 123/62 105/59 112/64 106/50  Pulse: 77 76 97 82  Temp:  98 F (36.7 C)  98.3 F (36.8 C)  TempSrc:  Oral  Oral  Resp: 16 18 18 18   Height:      Weight:      SpO2:        Fundus firm Perineum without swelling.  Lab Results  Component Value Date   WBC 12.6* 01/14/2015   HGB 11.5* 01/14/2015   HCT 33.1* 01/14/2015   MCV 93.2 01/14/2015   PLT 191 01/14/2015    --/--/O POS (03/23 0920)/RI  A/P Post partum day 1.  Routine care.  Expect d/c routine.    Jozlin Bently A

## 2015-01-14 NOTE — Discharge Summary (Signed)
Obstetric Discharge Summary Reason for Admission: onset of labor Prenatal Procedures: none Intrapartum Procedures: spontaneous vaginal delivery Postpartum Procedures: none Complications-Operative and Postpartum: none HEMOGLOBIN  Date Value Ref Range Status  01/14/2015 11.5* 12.0 - 15.0 g/dL Final   HCT  Date Value Ref Range Status  01/14/2015 33.1* 36.0 - 46.0 % Final    Discharge Diagnoses: Term Pregnancy-delivered  Discharge Information: Date: 01/14/2015 Activity: pelvic rest Diet: routine Medications: Ibuprofen Condition: stable Instructions: refer to practice specific booklet Discharge to: home Follow-up Information    Follow up with Almon Hercules., MD In 4 weeks.   Specialty:  Obstetrics and Gynecology   Contact information:   [address] [place] [address] [phone]       Newborn Data: Live born female  Birth Weight: 8 lb 3 oz (3715 g) APGAR: 10, 10  Home with mother.  Edwyna Dangerfield A 01/14/2015, 7:31 AM

## 2015-01-14 NOTE — Lactation Note (Signed)
This note was copied from the chart of Dawn [name]. Lactation Consultation Note  Patient Name: Dawn [name] [id number] Date: 01/14/2015 Reason for consult: Initial assessment  Mom reports this baby is nursing well, denies questions or concerns. Lactation brochure left for review, advised of OP services and support group. Encouraged to call if needs assist.  Maternal Data Has patient been taught Hand Expression?: No (Mom reports she knows how to hand express) Does the patient have breastfeeding experience prior to this delivery?: Yes  Feeding Feeding Type: Breast Fed Length of feed: 30 min  LATCH Score/Interventions                      Lactation Tools Discussed/Used     Consult Status Consult Status: Complete    Alfred Levins 01/14/2015, 3:26 PM

## 2015-09-23 ENCOUNTER — Other Ambulatory Visit (HOSPITAL_COMMUNITY): Payer: Self-pay | Admitting: Rheumatology

## 2015-09-23 ENCOUNTER — Ambulatory Visit (HOSPITAL_COMMUNITY)
Admission: RE | Admit: 2015-09-23 | Discharge: 2015-09-23 | Disposition: A | Discharge status: Home / Self Care | Payer: BLUE CROSS/BLUE SHIELD | Source: Ambulatory Visit | Attending: Rheumatology | Admitting: Rheumatology

## 2015-09-23 DIAGNOSIS — Z9225 Personal history of immunosupression therapy: Secondary | ICD-10-CM

## 2015-09-23 DIAGNOSIS — M069 Rheumatoid arthritis, unspecified: Secondary | ICD-10-CM | POA: Diagnosis present

## 2016-05-10 ENCOUNTER — Other Ambulatory Visit (HOSPITAL_COMMUNITY): Payer: Self-pay | Admitting: *Deleted

## 2016-05-11 ENCOUNTER — Encounter (HOSPITAL_COMMUNITY)
Admission: RE | Admit: 2016-05-11 | Discharge: 2016-05-11 | Disposition: A | Discharge status: Home / Self Care | Payer: BLUE CROSS/BLUE SHIELD | Source: Ambulatory Visit | Attending: Rheumatology | Admitting: Rheumatology

## 2016-05-11 DIAGNOSIS — M0579 Rheumatoid arthritis with rheumatoid factor of multiple sites without organ or systems involvement: Secondary | ICD-10-CM | POA: Insufficient documentation

## 2016-05-11 LAB — CBC WITH DIFFERENTIAL/PLATELET
BASOS ABS: 0 10*3/uL (ref 0.0–0.1)
BASOS PCT: 0 %
Eosinophils Absolute: 0 10*3/uL (ref 0.0–0.7)
Eosinophils Relative: 0 %
HCT: 34.9 % — ABNORMAL LOW (ref 36.0–46.0)
HEMOGLOBIN: 11.7 g/dL — AB (ref 12.0–15.0)
Lymphocytes Relative: 21 %
Lymphs Abs: 1.6 10*3/uL (ref 0.7–4.0)
MCH: 29 pg (ref 26.0–34.0)
MCHC: 33.5 g/dL (ref 30.0–36.0)
MCV: 86.4 fL (ref 78.0–100.0)
MONO ABS: 0.4 10*3/uL (ref 0.1–1.0)
Monocytes Relative: 5 %
NEUTROS PCT: 74 %
Neutro Abs: 5.6 10*3/uL (ref 1.7–7.7)
Platelets: 271 10*3/uL (ref 150–400)
RBC: 4.04 MIL/uL (ref 3.87–5.11)
RDW: 13.4 % (ref 11.5–15.5)
WBC: 7.6 10*3/uL (ref 4.0–10.5)

## 2016-05-11 LAB — COMPREHENSIVE METABOLIC PANEL
ALBUMIN: 3.6 g/dL (ref 3.5–5.0)
ALK PHOS: 78 U/L (ref 38–126)
ALT: 9 U/L — AB (ref 14–54)
AST: 14 U/L — AB (ref 15–41)
Anion gap: 7 (ref 5–15)
BILIRUBIN TOTAL: 0.4 mg/dL (ref 0.3–1.2)
BUN: 11 mg/dL (ref 6–20)
CALCIUM: 9 mg/dL (ref 8.9–10.3)
CO2: 25 mmol/L (ref 22–32)
Chloride: 107 mmol/L (ref 101–111)
Creatinine, Ser: 0.52 mg/dL (ref 0.44–1.00)
GFR calc Af Amer: >60 mL/min (ref >60)
GLUCOSE: 101 mg/dL — AB (ref 65–99)
Potassium: 3.8 mmol/L (ref 3.5–5.1)
Sodium: 139 mmol/L (ref 135–145)
TOTAL PROTEIN: 7.3 g/dL (ref 6.5–8.1)

## 2016-05-11 MED ORDER — DIPHENHYDRAMINE HCL 25 MG PO TABS
25.0000 mg | ORAL_TABLET | ORAL | Status: DC
  Administered 2016-05-11: 25 mg via ORAL
  Filled 2016-05-11: qty 1

## 2016-05-11 MED ORDER — SODIUM CHLORIDE 0.9 % IV SOLN
INTRAVENOUS | Status: DC
  Administered 2016-05-11: 11:00:00 via INTRAVENOUS

## 2016-05-11 MED ORDER — SODIUM CHLORIDE 0.9 % IV SOLN
500.0000 mg | INTRAVENOUS | Status: DC
  Administered 2016-05-11: 500 mg via INTRAVENOUS
  Filled 2016-05-11: qty 20

## 2016-05-11 MED ORDER — ACETAMINOPHEN 325 MG PO TABS
ORAL_TABLET | ORAL | Status: AC
  Administered 2016-05-11: 650 mg via ORAL
  Filled 2016-05-11: qty 2

## 2016-05-11 MED ORDER — ACETAMINOPHEN 325 MG PO TABS
650.0000 mg | ORAL_TABLET | ORAL | Status: DC
  Administered 2016-05-11: 650 mg via ORAL

## 2016-05-11 MED ORDER — DIPHENHYDRAMINE HCL 25 MG PO CAPS
ORAL_CAPSULE | ORAL | Status: AC
  Administered 2016-05-11: 25 mg via ORAL
  Filled 2016-05-11: qty 1

## 2016-05-11 NOTE — Discharge Instructions (Signed)
Abatacept solution for injection (subcutaneous or intravenous use)  What is this medicine?  ABATACEPT (a ba TA sept) is used to treat moderate to severe active rheumatoid arthritis in adults. This medicine is also used to treat juvenile idiopathic arthritis.  This medicine may be used for other purposes; ask your health care provider or pharmacist if you have questions.  What should I tell my health care provider before I take this medicine?  They need to know if you have any of these conditions:  -are taking other medicines to treat rheumatoid arthritis  -COPD  -diabetes  -infection or history of infections  -recently received or scheduled to receive a vaccine  -scheduled to have surgery  -tuberculosis, a positive skin test for tuberculosis or have recently been in close contact with someone who has tuberculosis  -viral hepatitis  -an unusual or allergic reaction to abatacept, other medicines, foods, dyes, or preservatives  -pregnant or trying to get pregnant  -breast-feeding  How should I use this medicine?  This medicine is for infusion into a vein or for injection under the skin. Infusions are given by a health care professional in a hospital or clinic setting. If you are to give your own medicine at home, you will be taught how to prepare and give this medicine under the skin. Use exactly as directed. Take your medicine at regular intervals. Do not take your medicine more often than directed.  It is important that you put your used needles and syringes in a special sharps container. Do not put them in a trash can. If you do not have a sharps container, call your pharmacist or healthcare provider to get one.  Talk to your pediatrician regarding the use of this medicine in children. While infusions in a clinic may be prescribed for children as young as 6 years for selected conditions, precautions do apply.  Overdosage: If you think you have taken too much of this medicine contact a poison control center or  emergency room at once.  NOTE: This medicine is only for you. Do not share this medicine with others.  What if I miss a dose?  This medicine is used once a week if given by injection under the skin. If you miss a dose, take it as soon as you can. If it is almost time for your next dose, take only that dose. Do not take double or extra doses.  If you are to be given an infusion, it is important not to miss your dose. Doses are usually every 4 weeks. Call your doctor or health care professional if you are unable to keep an appointment.  What may interact with this medicine?  Do not take this medicine with any of the following medications:  -adalimumab  -anakinra  -certolizumab  -etanercept  -golimumab  -infliximab  -live virus vaccines  -rituximab  -tocilizumab  This medicine may also interact with the following medications:  -vaccines  This list may not describe all possible interactions. Give your health care provider a list of all the medicines, herbs, non-prescription drugs, or dietary supplements you use. Also tell them if you smoke, drink alcohol, or use illegal drugs. Some items may interact with your medicine.  What should I watch for while using this medicine?  Visit your doctor for regular check ups while you are taking this medicine. Tell your doctor or healthcare professional if your symptoms do not start to get better or if they get worse.  Call your doctor or health   care professional if you get a cold or other infection while receiving this medicine. Do not treat yourself. This medicine may decrease your body's ability to fight infection. Try to avoid being around people who are sick.  What side effects may I notice from receiving this medicine?  Side effects that you should report to your doctor or health care professional as soon as possible:  -allergic reactions like skin rash, itching or hives, swelling of the face, lips, or tongue  -breathing problems  -chest pain  -signs of infection - fever or  chills, cough, unusual tiredness, pain or trouble passing urine, or warm, red or painful skin  Side effects that usually do not require medical attention (Report these to your doctor or health care professional if they continue or are bothersome.):  -dizziness  -headache  -nausea, vomiting  -sore throat  -stomach upset  This list may not describe all possible side effects. Call your doctor for medical advice about side effects. You may report side effects to FDA at 1-800-FDA-1088.  Where should I keep my medicine?  Infusions will be given in a hospital or clinic and will not be stored at home.  Storage for syringes given under the skin and stored at home:  Keep out of the reach of children. Store in a refrigerator between 2 and 8 degrees C (36 and 46 degrees F). Keep this medicine in the original container. Protect from light. Do not freeze. Throw away any unused medicine after the expiration date.  NOTE: This sheet is a summary. It may not cover all possible information. If you have questions about this medicine, talk to your doctor, pharmacist, or health care provider.     © 2016, [employer]. (2010-08-26 11:11:03)

## 2016-05-24 ENCOUNTER — Other Ambulatory Visit (HOSPITAL_COMMUNITY): Payer: Self-pay | Admitting: *Deleted

## 2016-05-25 ENCOUNTER — Encounter (HOSPITAL_COMMUNITY)
Admission: RE | Admit: 2016-05-25 | Discharge: 2016-05-25 | Disposition: A | Discharge status: Home / Self Care | Payer: BLUE CROSS/BLUE SHIELD | Source: Ambulatory Visit | Attending: Rheumatology | Admitting: Rheumatology

## 2016-05-25 DIAGNOSIS — M0579 Rheumatoid arthritis with rheumatoid factor of multiple sites without organ or systems involvement: Secondary | ICD-10-CM | POA: Insufficient documentation

## 2016-05-25 MED ORDER — SODIUM CHLORIDE 0.9 % IV SOLN
500.0000 mg | INTRAVENOUS | Status: AC
  Administered 2016-05-25: 500 mg via INTRAVENOUS
  Filled 2016-05-25 (×2): qty 20

## 2016-05-25 MED ORDER — SODIUM CHLORIDE 0.9 % IV SOLN
INTRAVENOUS | Status: AC
  Administered 2016-05-25: 10:00:00 via INTRAVENOUS

## 2016-05-25 MED ORDER — DIPHENHYDRAMINE HCL 25 MG PO CAPS: 25.0000 mg | ORAL_CAPSULE | ORAL | Status: DC

## 2016-05-25 MED ORDER — ACETAMINOPHEN 325 MG PO TABS: 650.0000 mg | ORAL_TABLET | ORAL | Status: DC

## 2016-06-08 ENCOUNTER — Encounter (HOSPITAL_COMMUNITY)
Admission: RE | Admit: 2016-06-08 | Discharge: 2016-06-08 | Disposition: A | Discharge status: Home / Self Care | Payer: BLUE CROSS/BLUE SHIELD | Source: Ambulatory Visit | Attending: Rheumatology | Admitting: Rheumatology

## 2016-06-08 DIAGNOSIS — M0579 Rheumatoid arthritis with rheumatoid factor of multiple sites without organ or systems involvement: Secondary | ICD-10-CM | POA: Diagnosis not present

## 2016-06-08 MED ORDER — ACETAMINOPHEN 325 MG PO TABS: 650.0000 mg | ORAL_TABLET | ORAL | Status: DC

## 2016-06-08 MED ORDER — SODIUM CHLORIDE 0.9 % IV SOLN
500.0000 mg | INTRAVENOUS | Status: AC
  Administered 2016-06-08: 500 mg via INTRAVENOUS
  Filled 2016-06-08: qty 20

## 2016-06-08 MED ORDER — DIPHENHYDRAMINE HCL 25 MG PO CAPS: 25.0000 mg | ORAL_CAPSULE | ORAL | Status: DC

## 2016-06-08 MED ORDER — SODIUM CHLORIDE 0.9 % IV SOLN
INTRAVENOUS | Status: AC
  Administered 2016-06-08: 10:00:00 via INTRAVENOUS

## 2016-06-26 DIAGNOSIS — M069 Rheumatoid arthritis, unspecified: Secondary | ICD-10-CM | POA: Insufficient documentation

## 2016-06-26 DIAGNOSIS — M0579 Rheumatoid arthritis with rheumatoid factor of multiple sites without organ or systems involvement: Secondary | ICD-10-CM

## 2016-07-05 NOTE — Addendum Note (Signed)
Encounter addended by: Fernande Bras, RN on: 07/05/2016  8:58 PM<BR>    Actions taken: Charge Capture section accepted

## 2016-07-06 ENCOUNTER — Encounter (HOSPITAL_COMMUNITY)
Admission: RE | Admit: 2016-07-06 | Discharge: 2016-07-06 | Disposition: A | Discharge status: Home / Self Care | Payer: BLUE CROSS/BLUE SHIELD | Source: Ambulatory Visit | Attending: Rheumatology | Admitting: Rheumatology

## 2016-07-06 DIAGNOSIS — M0579 Rheumatoid arthritis with rheumatoid factor of multiple sites without organ or systems involvement: Secondary | ICD-10-CM | POA: Diagnosis not present

## 2016-07-06 LAB — CBC WITH DIFFERENTIAL/PLATELET
Basophils Absolute: 0 10*3/uL (ref 0.0–0.1)
Basophils Relative: 1 %
EOS ABS: 0 10*3/uL (ref 0.0–0.7)
Eosinophils Relative: 1 %
HEMATOCRIT: 39.5 % (ref 36.0–46.0)
HEMOGLOBIN: 12.9 g/dL (ref 12.0–15.0)
LYMPHS ABS: 1.5 10*3/uL (ref 0.7–4.0)
LYMPHS PCT: 33 %
MCH: 29.5 pg (ref 26.0–34.0)
MCHC: 32.7 g/dL (ref 30.0–36.0)
MCV: 90.2 fL (ref 78.0–100.0)
MONOS PCT: 5 %
Monocytes Absolute: 0.2 10*3/uL (ref 0.1–1.0)
NEUTROS PCT: 60 %
Neutro Abs: 2.7 10*3/uL (ref 1.7–7.7)
Platelets: 247 10*3/uL (ref 150–400)
RBC: 4.38 MIL/uL (ref 3.87–5.11)
RDW: 15 % (ref 11.5–15.5)
WBC: 4.4 10*3/uL (ref 4.0–10.5)

## 2016-07-06 LAB — COMPREHENSIVE METABOLIC PANEL
ALT: 13 U/L — ABNORMAL LOW (ref 14–54)
ANION GAP: 4 — AB (ref 5–15)
AST: 20 U/L (ref 15–41)
Albumin: 3.9 g/dL (ref 3.5–5.0)
Alkaline Phosphatase: 76 U/L (ref 38–126)
BUN: 8 mg/dL (ref 6–20)
CHLORIDE: 109 mmol/L (ref 101–111)
CO2: 25 mmol/L (ref 22–32)
Calcium: 8.8 mg/dL — ABNORMAL LOW (ref 8.9–10.3)
Creatinine, Ser: 0.65 mg/dL (ref 0.44–1.00)
GFR calc Af Amer: >60 mL/min (ref >60)
GFR calc non Af Amer: >60 mL/min (ref >60)
GLUCOSE: 134 mg/dL — AB (ref 65–99)
POTASSIUM: 4.3 mmol/L (ref 3.5–5.1)
SODIUM: 138 mmol/L (ref 135–145)
TOTAL PROTEIN: 6.4 g/dL — AB (ref 6.5–8.1)
Total Bilirubin: 0.7 mg/dL (ref 0.3–1.2)

## 2016-07-06 MED ORDER — ACETAMINOPHEN 325 MG PO TABS: 650.0000 mg | ORAL_TABLET | ORAL | Status: DC

## 2016-07-06 MED ORDER — SODIUM CHLORIDE 0.9 % IV SOLN
INTRAVENOUS | Status: AC
  Administered 2016-07-06: 11:00:00 via INTRAVENOUS

## 2016-07-06 MED ORDER — DIPHENHYDRAMINE HCL 25 MG PO CAPS: 25.0000 mg | ORAL_CAPSULE | ORAL | Status: DC

## 2016-07-06 MED ORDER — SODIUM CHLORIDE 0.9 % IV SOLN
500.0000 mg | INTRAVENOUS | Status: AC
  Administered 2016-07-06: 500 mg via INTRAVENOUS
  Filled 2016-07-06: qty 20

## 2016-08-02 ENCOUNTER — Other Ambulatory Visit (HOSPITAL_COMMUNITY): Payer: Self-pay | Admitting: *Deleted

## 2016-08-03 ENCOUNTER — Encounter (HOSPITAL_COMMUNITY)
Admission: RE | Admit: 2016-08-03 | Discharge: 2016-08-03 | Disposition: A | Discharge status: Home / Self Care | Payer: BLUE CROSS/BLUE SHIELD | Source: Ambulatory Visit | Attending: Rheumatology | Admitting: Rheumatology

## 2016-08-03 DIAGNOSIS — M0579 Rheumatoid arthritis with rheumatoid factor of multiple sites without organ or systems involvement: Secondary | ICD-10-CM | POA: Diagnosis present

## 2016-08-03 MED ORDER — ABATACEPT 250 MG IV SOLR
500.0000 mg | INTRAVENOUS | Status: AC
  Administered 2016-08-03: 500 mg via INTRAVENOUS
  Filled 2016-08-03: qty 20

## 2016-08-03 MED ORDER — SODIUM CHLORIDE 0.9 % IV SOLN
INTRAVENOUS | Status: AC
  Administered 2016-08-03: 12:00:00 via INTRAVENOUS

## 2016-08-03 MED ORDER — ACETAMINOPHEN 325 MG PO TABS: 650.0000 mg | ORAL_TABLET | ORAL | Status: DC

## 2016-08-03 MED ORDER — DIPHENHYDRAMINE HCL 25 MG PO CAPS: 25.0000 mg | ORAL_CAPSULE | ORAL | Status: DC

## 2016-08-29 ENCOUNTER — Telehealth (INDEPENDENT_AMBULATORY_CARE_PROVIDER_SITE_OTHER): Payer: Self-pay | Admitting: Rheumatology

## 2016-08-29 NOTE — Telephone Encounter (Signed)
Patient calling to find out if it is time for her flu shot

## 2016-08-29 NOTE — Telephone Encounter (Signed)
Yes, now is the time for her Flu vaccine. I have called her to advise.

## 2016-08-31 ENCOUNTER — Encounter (HOSPITAL_COMMUNITY): Payer: BLUE CROSS/BLUE SHIELD

## 2016-09-05 ENCOUNTER — Other Ambulatory Visit (HOSPITAL_COMMUNITY): Payer: Self-pay | Admitting: *Deleted

## 2016-09-06 ENCOUNTER — Encounter (HOSPITAL_COMMUNITY)
Admission: RE | Admit: 2016-09-06 | Discharge: 2016-09-06 | Disposition: A | Discharge status: Home / Self Care | Payer: BLUE CROSS/BLUE SHIELD | Source: Ambulatory Visit | Attending: Rheumatology | Admitting: Rheumatology

## 2016-09-06 DIAGNOSIS — M0579 Rheumatoid arthritis with rheumatoid factor of multiple sites without organ or systems involvement: Secondary | ICD-10-CM | POA: Insufficient documentation

## 2016-09-06 LAB — CBC WITH DIFFERENTIAL/PLATELET
BASOS ABS: 0 10*3/uL (ref 0.0–0.1)
BASOS PCT: 0 %
Eosinophils Absolute: 0.1 10*3/uL (ref 0.0–0.7)
Eosinophils Relative: 1 %
HEMATOCRIT: 38.4 % (ref 36.0–46.0)
HEMOGLOBIN: 13 g/dL (ref 12.0–15.0)
Lymphocytes Relative: 32 %
Lymphs Abs: 1.5 10*3/uL (ref 0.7–4.0)
MCH: 30.4 pg (ref 26.0–34.0)
MCHC: 33.9 g/dL (ref 30.0–36.0)
MCV: 89.7 fL (ref 78.0–100.0)
MONO ABS: 0.3 10*3/uL (ref 0.1–1.0)
Monocytes Relative: 6 %
NEUTROS ABS: 2.8 10*3/uL (ref 1.7–7.7)
NEUTROS PCT: 61 %
Platelets: 239 10*3/uL (ref 150–400)
RBC: 4.28 MIL/uL (ref 3.87–5.11)
RDW: 13.3 % (ref 11.5–15.5)
WBC: 4.7 10*3/uL (ref 4.0–10.5)

## 2016-09-06 LAB — COMPREHENSIVE METABOLIC PANEL
ALBUMIN: 4.1 g/dL (ref 3.5–5.0)
ALT: 14 U/L (ref 14–54)
AST: 17 U/L (ref 15–41)
Alkaline Phosphatase: 64 U/L (ref 38–126)
Anion gap: 8 (ref 5–15)
BILIRUBIN TOTAL: 0.9 mg/dL (ref 0.3–1.2)
BUN: 13 mg/dL (ref 6–20)
CO2: 26 mmol/L (ref 22–32)
Calcium: 9.2 mg/dL (ref 8.9–10.3)
Chloride: 107 mmol/L (ref 101–111)
Creatinine, Ser: 0.71 mg/dL (ref 0.44–1.00)
GFR calc Af Amer: >60 mL/min (ref >60)
GFR calc non Af Amer: >60 mL/min (ref >60)
GLUCOSE: 96 mg/dL (ref 65–99)
POTASSIUM: 3.9 mmol/L (ref 3.5–5.1)
Sodium: 141 mmol/L (ref 135–145)
TOTAL PROTEIN: 6.7 g/dL (ref 6.5–8.1)

## 2016-09-06 MED ORDER — ACETAMINOPHEN 325 MG PO TABS: 650.0000 mg | ORAL_TABLET | ORAL | Status: DC

## 2016-09-06 MED ORDER — SODIUM CHLORIDE 0.9 % IV SOLN: INTRAVENOUS | Status: DC

## 2016-09-06 MED ORDER — DIPHENHYDRAMINE HCL 25 MG PO TABS
25.0000 mg | ORAL_TABLET | ORAL | Status: DC
  Filled 2016-09-06: qty 1

## 2016-09-06 MED ORDER — SODIUM CHLORIDE 0.9 % IV SOLN
500.0000 mg | INTRAVENOUS | Status: DC
  Administered 2016-09-06: 500 mg via INTRAVENOUS
  Filled 2016-09-06: qty 20

## 2016-09-06 NOTE — Progress Notes (Signed)
Labs normal.

## 2016-10-03 ENCOUNTER — Other Ambulatory Visit (HOSPITAL_COMMUNITY): Payer: Self-pay | Admitting: *Deleted

## 2016-10-04 ENCOUNTER — Encounter (HOSPITAL_COMMUNITY)
Admission: RE | Admit: 2016-10-04 | Discharge: 2016-10-04 | Disposition: A | Discharge status: Home / Self Care | Payer: BLUE CROSS/BLUE SHIELD | Source: Ambulatory Visit | Attending: Rheumatology | Admitting: Rheumatology

## 2016-10-04 DIAGNOSIS — M0579 Rheumatoid arthritis with rheumatoid factor of multiple sites without organ or systems involvement: Secondary | ICD-10-CM | POA: Insufficient documentation

## 2016-10-04 MED ORDER — DIPHENHYDRAMINE HCL 25 MG PO CAPS: 25.0000 mg | ORAL_CAPSULE | ORAL | Status: DC

## 2016-10-04 MED ORDER — SODIUM CHLORIDE 0.9 % IV SOLN: INTRAVENOUS | Status: DC

## 2016-10-04 MED ORDER — SODIUM CHLORIDE 0.9 % IV SOLN
500.0000 mg | INTRAVENOUS | Status: AC
  Administered 2016-10-04: 500 mg via INTRAVENOUS
  Filled 2016-10-04: qty 20

## 2016-10-04 MED ORDER — ACETAMINOPHEN 325 MG PO TABS: 650.0000 mg | ORAL_TABLET | ORAL | Status: DC

## 2016-10-06 LAB — QUANTIFERON IN TUBE
QFT TB AG MINUS NIL VALUE: 0.03 IU/mL
QUANTIFERON MITOGEN VALUE: 6.59 IU/mL
QUANTIFERON NIL VALUE: 0.02 [IU]/mL
QUANTIFERON TB AG VALUE: 0.05 IU/mL
QUANTIFERON TB GOLD: NEGATIVE

## 2016-10-06 LAB — QUANTIFERON TB GOLD ASSAY (BLOOD)

## 2016-10-09 NOTE — Progress Notes (Signed)
Negative result -

## 2016-10-20 IMAGING — CR DG CHEST 2V
2 series · 2 of 2 positions shown · non-contrast
Comparison: None.

CLINICAL DATA: On immunosuppressant therapy for rheumatoid
arthritis

EXAM:
CHEST  2 VIEW

[chest pa]
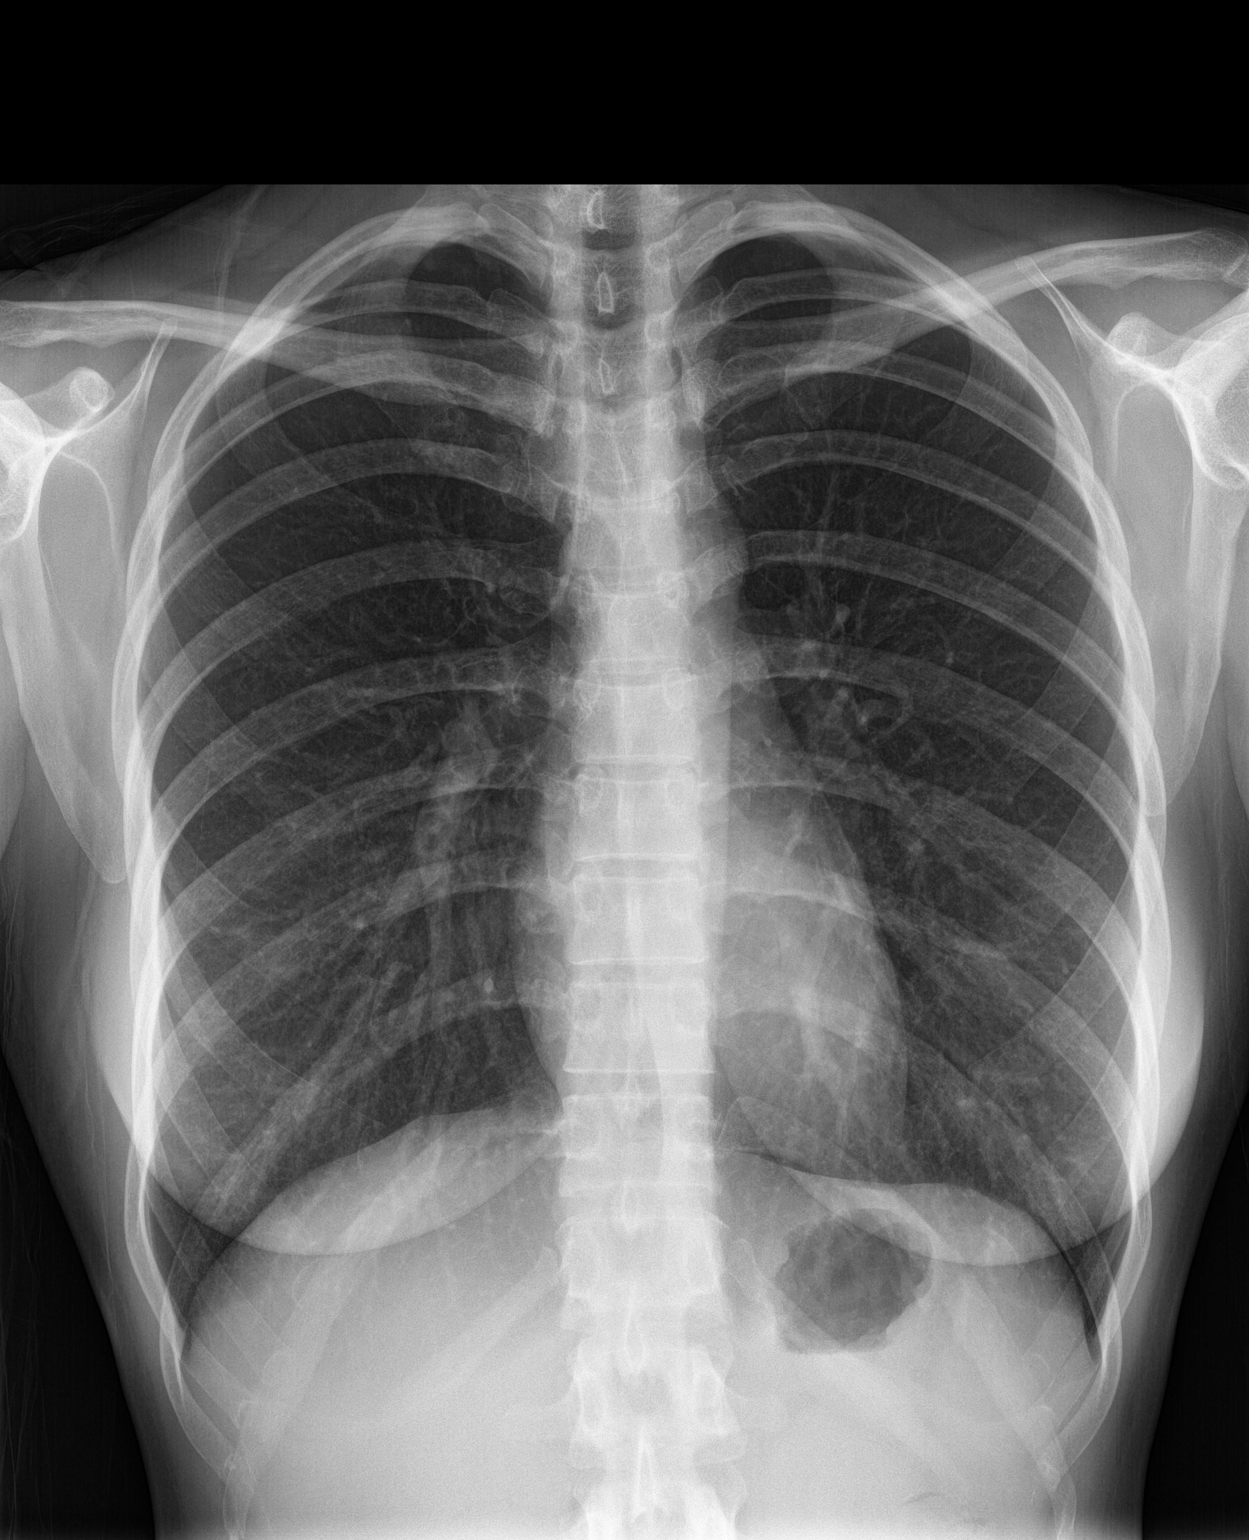

[chest lat]
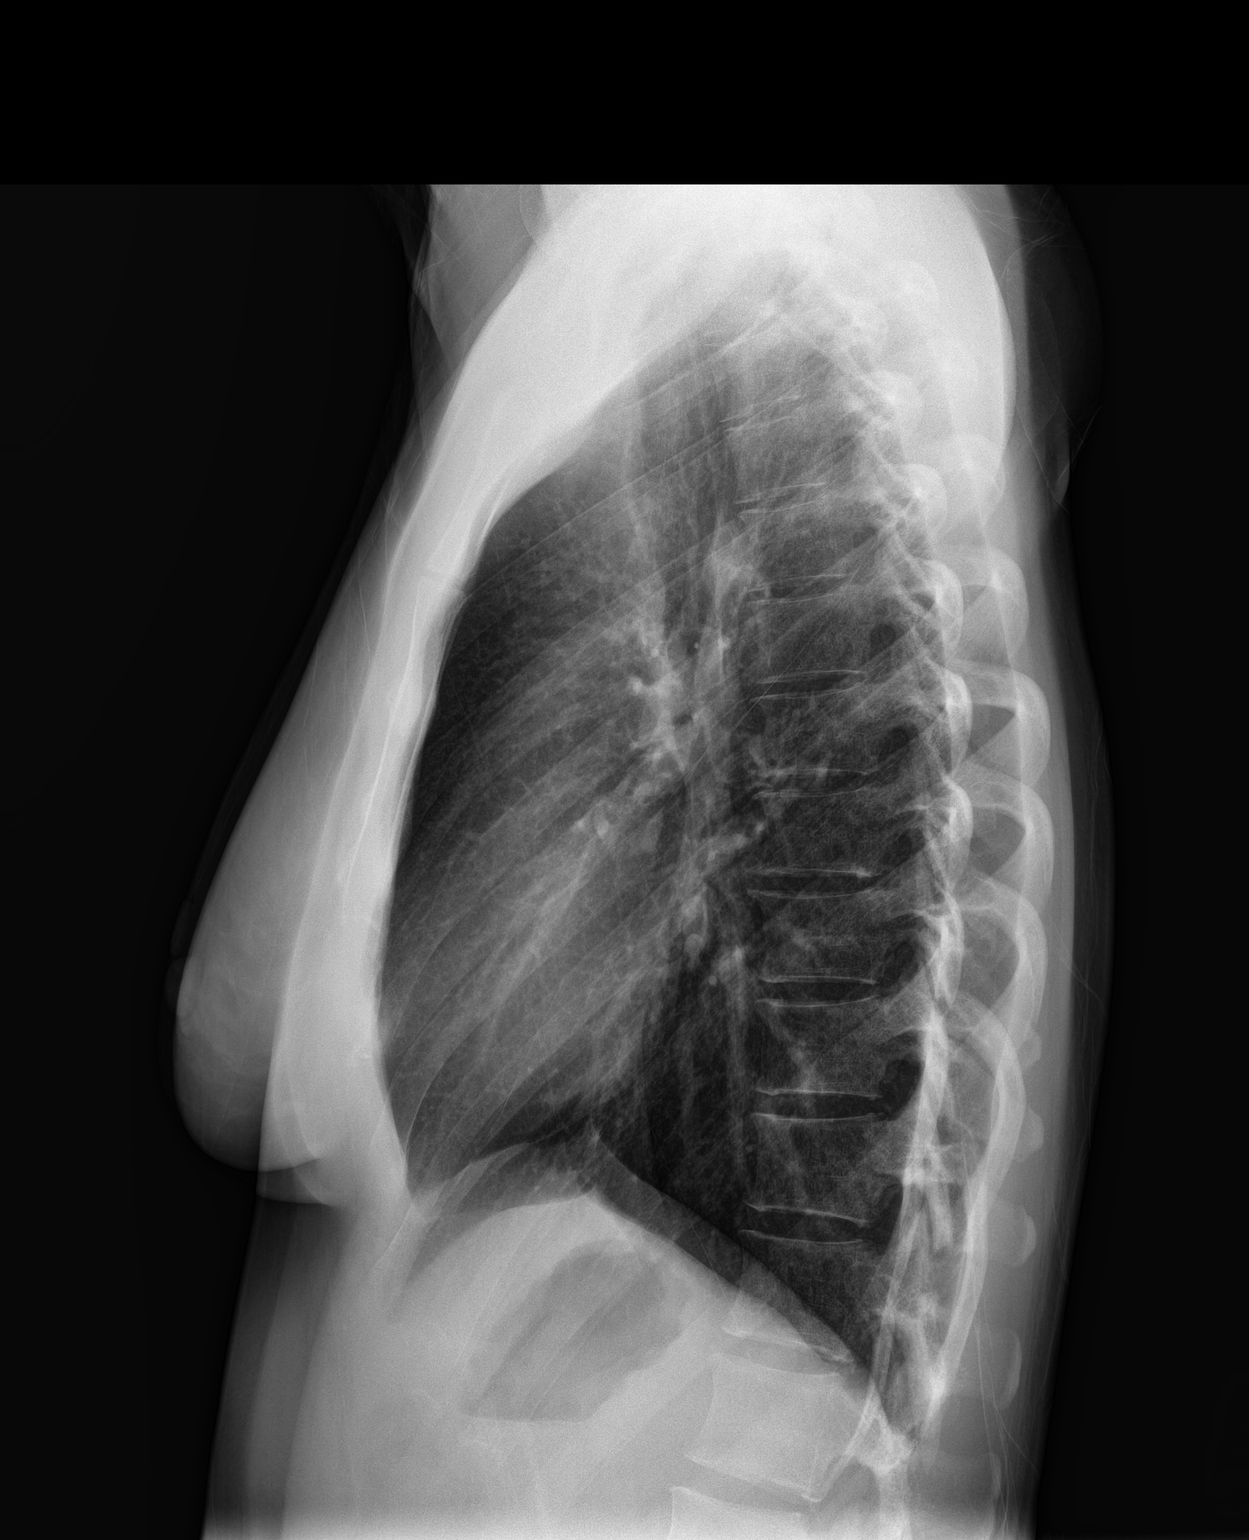

[2 of 2 positions shown; findings below may reference images not displayed]

FINDINGS: No active infiltrate or effusion is seen. Mediastinal and hilar
contours are unremarkable. The heart is within normal limits in
size. No bony abnormality is seen.
IMPRESSION: No active cardiopulmonary disease.

## 2016-10-31 ENCOUNTER — Other Ambulatory Visit: Payer: Self-pay | Admitting: *Deleted

## 2016-10-31 ENCOUNTER — Telehealth: Payer: Self-pay | Admitting: Rheumatology

## 2016-10-31 DIAGNOSIS — M0579 Rheumatoid arthritis with rheumatoid factor of multiple sites without organ or systems involvement: Secondary | ICD-10-CM

## 2016-10-31 NOTE — Telephone Encounter (Signed)
Orders have been placed in the computer for infusion. Patient is due for a follow up and message sent to the front for them to schedule patient.

## 2016-10-31 NOTE — Telephone Encounter (Signed)
Short stay called and needs orders on patient. She has scheduled appt tomorrow.  

## 2016-11-01 ENCOUNTER — Encounter (HOSPITAL_COMMUNITY)
Admission: RE | Admit: 2016-11-01 | Discharge: 2016-11-01 | Disposition: A | Discharge status: Home / Self Care | Payer: BLUE CROSS/BLUE SHIELD | Source: Ambulatory Visit | Attending: Rheumatology | Admitting: Rheumatology

## 2016-11-01 DIAGNOSIS — M0579 Rheumatoid arthritis with rheumatoid factor of multiple sites without organ or systems involvement: Secondary | ICD-10-CM | POA: Insufficient documentation

## 2016-11-01 LAB — COMPREHENSIVE METABOLIC PANEL
ALT: 13 U/L — ABNORMAL LOW (ref 14–54)
AST: 19 U/L (ref 15–41)
Albumin: 3.9 g/dL (ref 3.5–5.0)
Alkaline Phosphatase: 67 U/L (ref 38–126)
Anion gap: 6 (ref 5–15)
BILIRUBIN TOTAL: 0.5 mg/dL (ref 0.3–1.2)
BUN: 11 mg/dL (ref 6–20)
CALCIUM: 8.8 mg/dL — AB (ref 8.9–10.3)
CO2: 25 mmol/L (ref 22–32)
CREATININE: 0.64 mg/dL (ref 0.44–1.00)
Chloride: 108 mmol/L (ref 101–111)
GFR calc Af Amer: >60 mL/min (ref >60)
Glucose, Bld: 116 mg/dL — ABNORMAL HIGH (ref 65–99)
POTASSIUM: 4 mmol/L (ref 3.5–5.1)
Sodium: 139 mmol/L (ref 135–145)
TOTAL PROTEIN: 6.5 g/dL (ref 6.5–8.1)

## 2016-11-01 LAB — CBC
HEMATOCRIT: 37.6 % (ref 36.0–46.0)
Hemoglobin: 12.6 g/dL (ref 12.0–15.0)
MCH: 30.3 pg (ref 26.0–34.0)
MCHC: 33.5 g/dL (ref 30.0–36.0)
MCV: 90.4 fL (ref 78.0–100.0)
Platelets: 236 10*3/uL (ref 150–400)
RBC: 4.16 MIL/uL (ref 3.87–5.11)
RDW: 12.4 % (ref 11.5–15.5)
WBC: 5.2 10*3/uL (ref 4.0–10.5)

## 2016-11-01 MED ORDER — ACETAMINOPHEN 325 MG PO TABS: 650.0000 mg | ORAL_TABLET | ORAL | Status: DC

## 2016-11-01 MED ORDER — DIPHENHYDRAMINE HCL 25 MG PO CAPS: 25.0000 mg | ORAL_CAPSULE | ORAL | Status: DC

## 2016-11-01 MED ORDER — SODIUM CHLORIDE 0.9 % IV SOLN
500.0000 mg | INTRAVENOUS | Status: DC
  Administered 2016-11-01: 500 mg via INTRAVENOUS
  Filled 2016-11-01: qty 20

## 2016-11-01 NOTE — Progress Notes (Signed)
Labs normal.

## 2016-11-02 ENCOUNTER — Telehealth: Payer: Self-pay | Admitting: Rheumatology

## 2016-11-02 NOTE — Telephone Encounter (Signed)
-----   Message from Henriette Combs, LPN sent at 02/21/7615  1:47 PM EST ----- Regarding: Please schedule patient a follow up visit Please schedule patient a follow up visit. Patient was due November 2017. Thanks!

## 2016-11-02 NOTE — Telephone Encounter (Signed)
LMOM for patient to call back to schedule appointment.  

## 2016-11-07 ENCOUNTER — Telehealth: Payer: Self-pay

## 2016-11-07 NOTE — Telephone Encounter (Signed)
Spoke to patient.  I informed her that she has been enrolled in the [employer] [facility] IV Copay Assistance Program from 11/07/16 to 10/22/17.  I explained that this program will help pay toward the cost of the medication beyond what her insurance pays.  To get reimbursed, patient must submit the explanation of benefits from her infusions.  Advised patient to call [facility] Billing department to get the explanation of benefits.  I gave patient the phone number of the IV [place] program at [phone] to find out how to submit the explanation of benefits.  Patient voiced understanding and denies any further questions at this time.    Lilla Shook, Pharm.D., BCPS, CPP Clinical Pharmacist Pager: [phone] Phone: [phone] 11/07/2016 5:18 PM

## 2016-11-07 NOTE — Telephone Encounter (Addendum)
Received a fax of approval from [employer] [facility] IV Copay Assistance Program. Patient is covered from 11/07/16-10/22/17. She is responsible for the firs $5.00 of her co-pay per infusion, up to a maximum benefit of $15,000 per calendar year. This program covers the cost of the medication out-of-pocket expenses only.  Spoke with patient to update her. Forwarded the call to PharmD, Fleet Contras for clarification.  Will send documents to scan center.  Huber Mathers, [place], CPhT

## 2016-11-16 ENCOUNTER — Other Ambulatory Visit: Payer: Self-pay | Admitting: Radiology

## 2016-11-27 NOTE — Progress Notes (Signed)
Office Visit Note  Patient: Dawn [name]             Date of Birth: [birth year]           MRN: [id number]             PCP: [facility] Referring: No ref. provider found Visit Date: 11/28/2016 Occupation: '@GUAROCC' @    Subjective:  Follow-up Follow-up rheumatoid arthritis with a flare off and on lately and various joint pain  History of Present Illness: Dawn [name] is a 36 y.o. female  Last seen 07/06/2016. Patient had an appointment to return to clinic approximately November to have her left third PIP joint injected but she skipped that appointment.  Currently she's been having flares that she rates as 3-4 on a scale of 0-10 due to joint pain scale. Her first MCP joint bilaterally are usually painful, bilateral feet. Her right elbow joint was painful in the past but that has resolved after the cortisone injection. Her left knee was painful in the past but that has resolved after the cortisone injection in September 2017.  Patient was supposed to be on methotrexate according to the notes on September 2017 visit but patient states that she stopped her methotrexate due to side effects of nausea and GI symptoms back in June 2017. Pt states dr.d  Aware that she stopped the mtx. We discussed that possibly the monotherapy of Orencia IV is not enough to address the patient's pain and discomfort. And her RA is not fully controlled. We discussed the importance of doing dual therapy at this stage and patient is agreeable.   Activities of Daily Living:  Patient reports morning stiffness for 30 minutes.   Patient Reports nocturnal pain.  Difficulty dressing/grooming: Reports Difficulty climbing stairs: Reports Difficulty getting out of chair: Reports Difficulty using hands for taps, buttons, cutlery, and/or writing: Reports   Review of Systems  Constitutional: Negative for fatigue.  HENT: Negative for mouth sores and mouth dryness.   Eyes: Negative for dryness.    Respiratory: Negative for shortness of breath.   Gastrointestinal: Negative for constipation and diarrhea.  Musculoskeletal: Negative for myalgias and myalgias.  Skin: Negative for sensitivity to sunlight.  Psychiatric/Behavioral: Negative for decreased concentration and sleep disturbance.    PMFS History:  Patient Active Problem List   Diagnosis Date Noted  . Rheumatoid arthritis with rheumatoid factor of multiple sites without organ or systems involvement ([facility]) 06/26/2016  . Status post induction of labor 01/13/2015  . Term pregnancy 01/13/2015  . Spontaneous vaginal delivery 01/13/2015    Past Medical History:  Diagnosis Date  . H/O laparoscopy 05/05/13  . History of ectopic pregnancy 05/05/13  . Hx of unilateral salpingectomy 05/05/13   Left  . Hx of wisdom tooth extraction   . Medical history non-contributory     No family history on file. Past Surgical History:  Procedure Laterality Date  . LAPAROSCOPY N/A 05/06/2013   Procedure: LAPAROSCOPY OPERATIVE, Left Salpingectomy, Removal Ectopic Pregnancy;  Surgeon: Sharene Butters, MD;  Location: [facility];  Service: Gynecology;  Laterality: N/A;  . WISDOM TOOTH EXTRACTION     Social History   Social History Narrative  . No narrative on file     Objective: Vital Signs: BP (!) 102/58   Pulse 68   Resp 14   Ht '5\' 6"'  (1.676 m)   Wt 134 lb (60.8 kg)   LMP 10/31/2016   BMI 21.63 kg/m    Physical Exam  Constitutional: She  is oriented to person, place, and time. She appears well-developed and well-nourished.  HENT:  Head: Normocephalic and atraumatic.  Eyes: EOM are normal. Pupils are equal, round, and reactive to light.  Cardiovascular: Normal rate, regular rhythm and normal heart sounds.  Exam reveals no gallop and no friction rub.   No murmur heard. Pulmonary/Chest: Effort normal and breath sounds normal. She has no wheezes. She has no rales.  Abdominal: Soft. Bowel sounds are normal. She exhibits no distension. There  is no tenderness. There is no guarding. No hernia.  Musculoskeletal: Normal range of motion. She exhibits no edema, tenderness or deformity.  Lymphadenopathy:    She has no cervical adenopathy.  Neurological: She is alert and oriented to person, place, and time. Coordination normal.  Skin: Skin is warm and dry. Capillary refill takes less than 2 seconds. No rash noted.  Psychiatric: She has a normal mood and affect. Her behavior is normal.  Nursing note and vitals reviewed.    Musculoskeletal Exam:  Full range of motion of all joints Grip strength is equal and strong bilaterally Fibromyalgia tender points are all absent  CDAI Exam: CDAI Homunculus Exam:   Tenderness:  Right hand: 1st MCP Left hand: 1st MCP and 3rd PIP  Swelling:  Left hand: 3rd PIP  Joint Counts:  CDAI Tender Joint count: 3 CDAI Swollen Joint count: 1  +synovitis as noted above.   Investigation: No additional findings. Hospital Outpatient Visit on 11/01/2016  Component Date Value Ref Range Status  . WBC 11/01/2016 5.2  4.0 - 10.5 K/uL Final  . RBC 11/01/2016 4.16  3.87 - 5.11 MIL/uL Final  . Hemoglobin 11/01/2016 12.6  12.0 - 15.0 g/dL Final  . HCT 11/01/2016 37.6  36.0 - 46.0 % Final  . MCV 11/01/2016 90.4  78.0 - 100.0 fL Final  . MCH 11/01/2016 30.3  26.0 - 34.0 pg Final  . MCHC 11/01/2016 33.5  30.0 - 36.0 g/dL Final  . RDW 11/01/2016 12.4  11.5 - 15.5 % Final  . Platelets 11/01/2016 236  150 - 400 K/uL Final  . Sodium 11/01/2016 139  135 - 145 mmol/L Final  . Potassium 11/01/2016 4.0  3.5 - 5.1 mmol/L Final  . Chloride 11/01/2016 108  101 - 111 mmol/L Final  . CO2 11/01/2016 25  22 - 32 mmol/L Final  . Glucose, Bld 11/01/2016 116* 65 - 99 mg/dL Final  . BUN 11/01/2016 11  6 - 20 mg/dL Final  . Creatinine, Ser 11/01/2016 0.64  0.44 - 1.00 mg/dL Final  . Calcium 11/01/2016 8.8* 8.9 - 10.3 mg/dL Final  . Total Protein 11/01/2016 6.5  6.5 - 8.1 g/dL Final  . Albumin 11/01/2016 3.9  3.5 - 5.0  g/dL Final  . AST 11/01/2016 19  15 - 41 U/L Final  . ALT 11/01/2016 13* 14 - 54 U/L Final  . Alkaline Phosphatase 11/01/2016 67  38 - 126 U/L Final  . Total Bilirubin 11/01/2016 0.5  0.3 - 1.2 mg/dL Final  . GFR calc non Af Amer 11/01/2016 >60  >60 mL/min Final  . GFR calc Af Amer 11/01/2016 >60  >60 mL/min Final   Comment: (NOTE) The eGFR has been calculated using the CKD EPI equation. This calculation has not been validated in all clinical situations. eGFR's persistently <60 mL/min signify possible Chronic Kidney Disease.   Georgiann Hahn gap 11/01/2016 6  5 - 15 Final  Hospital Outpatient Visit on 10/04/2016  Component Date Value Ref Range Status  . QUANTIFERON INCUBATION  10/04/2016 Comment   Final   Comment: (NOTE) Specimen incubated at [place], [place], [place]. Performed At: [facility] [place], [place] [id number] Lindon Romp MD EX:[id number]   . QUANTIFERON TB GOLD 10/04/2016 Negative  Negative Final  . QUANTIFERON CRITERIA 10/04/2016 Comment   Final   Comment: (NOTE) To be considered positive a specimen should have a TB Ag minus Nil value greater than or equal to 0.35 IU/mL and in addition the TB Ag minus Nil value must be greater than or equal to 25% of the Nil value. There may be insufficient information in these values to differentiate between some negative and some indeterminate test values.   . QUANTIFERON TB AG VALUE 10/04/2016 0.05  IU/mL Final  . Quantiferon Nil Value 10/04/2016 0.02  IU/mL Final  . QUANTIFERON MITOGEN VALUE 10/04/2016 6.59  IU/mL Final  . QFT TB AG MINUS NIL VALUE 10/04/2016 0.03  IU/mL Final  . Interpretation: 10/04/2016 Comment   Final   Comment: (NOTE) The QuantiFERON TB Gold (in Tube) assay is intended for use as an aid in the diagnosis of TB infection. Negative results suggest that there is no TB infection. In patients with high suspicion of exposure, a negative test should be repeated. A positive test indicates  infection with Mycobacterium tuberculosis. Among individuals without tuberculosis infection, a positive test may be due to exposure to [place], M. szulgai or M. marinum. On the Internet, go to https://figueroa-lambert.info/ for further details. Performed At: [facility] [address] [place], [place] [id number] [place] WG:[id number]   Hospital Outpatient Visit on 09/06/2016  Component Date Value Ref Range Status  . WBC 09/06/2016 4.7  4.0 - 10.5 K/uL Final  . RBC 09/06/2016 4.28  3.87 - 5.11 MIL/uL Final  . Hemoglobin 09/06/2016 13.0  12.0 - 15.0 g/dL Final  . HCT 09/06/2016 38.4  36.0 - 46.0 % Final  . MCV 09/06/2016 89.7  78.0 - 100.0 fL Final  . MCH 09/06/2016 30.4  26.0 - 34.0 pg Final  . MCHC 09/06/2016 33.9  30.0 - 36.0 g/dL Final  . RDW 09/06/2016 13.3  11.5 - 15.5 % Final  . Platelets 09/06/2016 239  150 - 400 K/uL Final  . Neutrophils Relative % 09/06/2016 61  % Final  . Neutro Abs 09/06/2016 2.8  1.7 - 7.7 K/uL Final  . Lymphocytes Relative 09/06/2016 32  % Final  . Lymphs Abs 09/06/2016 1.5  0.7 - 4.0 K/uL Final  . Monocytes Relative 09/06/2016 6  % Final  . Monocytes Absolute 09/06/2016 0.3  0.1 - 1.0 K/uL Final  . Eosinophils Relative 09/06/2016 1  % Final  . Eosinophils Absolute 09/06/2016 0.1  0.0 - 0.7 K/uL Final  . Basophils Relative 09/06/2016 0  % Final  . Basophils Absolute 09/06/2016 0.0  0.0 - 0.1 K/uL Final  . Sodium 09/06/2016 141  135 - 145 mmol/L Final  . Potassium 09/06/2016 3.9  3.5 - 5.1 mmol/L Final  . Chloride 09/06/2016 107  101 - 111 mmol/L Final  . CO2 09/06/2016 26  22 - 32 mmol/L Final  . Glucose, Bld 09/06/2016 96  65 - 99 mg/dL Final  . BUN 09/06/2016 13  6 - 20 mg/dL Final  . Creatinine, Ser 09/06/2016 0.71  0.44 - 1.00 mg/dL Final  . Calcium 09/06/2016 9.2  8.9 - 10.3 mg/dL Final  . Total Protein 09/06/2016 6.7  6.5 - 8.1 g/dL Final  . Albumin 09/06/2016 4.1  3.5 - 5.0 g/dL  Final  . AST 09/06/2016 17  15 - 41 U/L Final  . ALT  09/06/2016 14  14 - 54 U/L Final  . Alkaline Phosphatase 09/06/2016 64  38 - 126 U/L Final  . Total Bilirubin 09/06/2016 0.9  0.3 - 1.2 mg/dL Final  . GFR calc non Af Amer 09/06/2016 >60  >60 mL/min Final  . GFR calc Af Amer 09/06/2016 >60  >60 mL/min Final   Comment: (NOTE) The eGFR has been calculated using the CKD EPI equation. This calculation has not been validated in all clinical situations. eGFR's persistently <60 mL/min signify possible Chronic Kidney Disease.   Georgiann Hahn gap 09/06/2016 8  5 - 15 Final  Hospital Outpatient Visit on 07/06/2016  Component Date Value Ref Range Status  . WBC 07/06/2016 4.4  4.0 - 10.5 K/uL Final  . RBC 07/06/2016 4.38  3.87 - 5.11 MIL/uL Final  . Hemoglobin 07/06/2016 12.9  12.0 - 15.0 g/dL Final  . HCT 07/06/2016 39.5  36.0 - 46.0 % Final  . MCV 07/06/2016 90.2  78.0 - 100.0 fL Final  . MCH 07/06/2016 29.5  26.0 - 34.0 pg Final  . MCHC 07/06/2016 32.7  30.0 - 36.0 g/dL Final  . RDW 07/06/2016 15.0  11.5 - 15.5 % Final  . Platelets 07/06/2016 247  150 - 400 K/uL Final  . Neutrophils Relative % 07/06/2016 60  % Final  . Neutro Abs 07/06/2016 2.7  1.7 - 7.7 K/uL Final  . Lymphocytes Relative 07/06/2016 33  % Final  . Lymphs Abs 07/06/2016 1.5  0.7 - 4.0 K/uL Final  . Monocytes Relative 07/06/2016 5  % Final  . Monocytes Absolute 07/06/2016 0.2  0.1 - 1.0 K/uL Final  . Eosinophils Relative 07/06/2016 1  % Final  . Eosinophils Absolute 07/06/2016 0.0  0.0 - 0.7 K/uL Final  . Basophils Relative 07/06/2016 1  % Final  . Basophils Absolute 07/06/2016 0.0  0.0 - 0.1 K/uL Final  . Sodium 07/06/2016 138  135 - 145 mmol/L Final  . Potassium 07/06/2016 4.3  3.5 - 5.1 mmol/L Final  . Chloride 07/06/2016 109  101 - 111 mmol/L Final  . CO2 07/06/2016 25  22 - 32 mmol/L Final  . Glucose, Bld 07/06/2016 134* 65 - 99 mg/dL Final  . BUN 07/06/2016 8  6 - 20 mg/dL Final  . Creatinine, Ser 07/06/2016 0.65  0.44 - 1.00 mg/dL Final  . Calcium 07/06/2016 8.8* 8.9 -  10.3 mg/dL Final  . Total Protein 07/06/2016 6.4* 6.5 - 8.1 g/dL Final  . Albumin 07/06/2016 3.9  3.5 - 5.0 g/dL Final  . AST 07/06/2016 20  15 - 41 U/L Final  . ALT 07/06/2016 13* 14 - 54 U/L Final  . Alkaline Phosphatase 07/06/2016 76  38 - 126 U/L Final  . Total Bilirubin 07/06/2016 0.7  0.3 - 1.2 mg/dL Final  . GFR calc non Af Amer 07/06/2016 >60  >60 mL/min Final  . GFR calc Af Amer 07/06/2016 >60  >60 mL/min Final   Comment: (NOTE) The eGFR has been calculated using the CKD EPI equation. This calculation has not been validated in all clinical situations. eGFR's persistently <60 mL/min signify possible Chronic Kidney Disease.   . Anion gap 07/06/2016 4* 5 - 15 Final     Imaging: No results found.  Speciality Comments: No specialty comments available.    Procedures:  No procedures performed Allergies: Patient has no known allergies.   Assessment / Plan:     Visit  Diagnoses: Rheumatoid arthritis with rheumatoid factor of multiple sites without organ or systems involvement [facility]) - 11/28/2016: +RF; +CCP; Erosive Dz;   High risk medications (not anticoagulants) long-term use - Nov 28, 2016==> Orencia q 4wks + tylenol + benadryl; MTX 8/WK;  Bilateral hand pain  Nausea and vomiting, intractability of vomiting not specified, unspecified vomiting type - 11/28/2016: ==>assoc w/ mtx use (at high dose); will try lower dose and will adjust if ongoing nausea; also will use zofran   Plan: #1: Rheumatoid arthritis. Positive rheumatoid factor and positive CCP. History of erosive disease. Currently having tenderness to bilateral first MCP joint without swelling redness or warmth. Also having ongoing pain to the left third PIP joint. (Had plans of getting cortisone injection by Dr. Estanislado Pandy but patient failed to keep that appointment  #2: On Orencia IV only every 4 weeks. Was supposed to be on methotrexate but because of GI symptoms and fatigue, patient discontinued this medication on  June 2017. Due to the current joint pain and discomfort, we discussed the importance of doing dual therapy and patient is agreeable. We discussed the risks and benefits of the medication as well as the advantages of this manages of pills versus injectable. After consideration, patient chose to do the pill. We will start off on the lower dose at 0.6 mL's per week of methotrexate to mitigate the fatigue and nausea. We also discussed using Zofran 4 mg prior to methotrexate and then every 8 hours post methotrexate  #3: Note that patient had right elbow joint contracture in the past but that has now resolved. Dr. Keturah Barre injected the right elbow joint with 20 mg of Kenalog.  #4: Patient's left knee was bothering her at the last visit in September and now 2 was injected with 60 mg of Kenalog. Patient is doing well with that still.  #5: High risk prescription. On Orencia IV. She was pain-free for about 5 months but she started having flares recently. Methotrexate. Patient stopped this medication June 2017. I suspect that the absence of methotrexate is causing the patient to have some of the flare that she is having now. Please see above for full details since we restarted the patient back on methotrexate at 0.6 mL's per week  #6: Return to clinic in 3 months and at that time if the left third PIP joint is having swelling and pain, we can do a cortisone injection through Dr. Estanislado Pandy.  #7: Patient get labs done about every 2 months when she gets her Orencia infusion every 4 weeks.  #8: Patient had a flare of pain so bad that she was having trouble walking last week. She is requesting a handicap placard for those occasions. I've asked the patient to minimize the use of the handicap placard when she actually does not need it and under those circumstances of the happy to give her 6 month temporary parking placard. Patient is agreeable.  Orders: No orders of the defined types were placed in this  encounter.  Meds ordered this encounter  Medications  . methotrexate 50 MG/2ML injection    Sig: Inject 0.6 mLs (15 mg total) into the vein once a week.    Dispense:  8 mL    Refill:  0    Dispense 2 ML vials with preservatives. Dispense enough for 90 day supply with no refill    Order Specific Question:   Supervising Provider    Answer:   Bo Merino [[id number]]  . folic acid (FOLVITE) 1 MG tablet  Sig: Take 2 tablets (2 mg total) by mouth daily.    Dispense:  180 tablet    Refill:  4    Order Specific Question:   Supervising Provider    Answer:   Bo Merino [[id number]]  . Tuberculin-Allergy Syringes 27G X 1/2" 1 ML KIT    Sig: Inject 1 Syringe into the skin once a week. If syringe size greater than 55m, please call me at my office.    Dispense:  12 each    Refill:  4    Order Specific Question:   Supervising Provider    Answer:   DLyda Perone . ondansetron (ZOFRAN) 4 MG tablet    Sig: Take 1 tablet (4 mg total) by mouth every 8 (eight) hours as needed for nausea or vomiting.    Dispense:  20 tablet    Refill:  2    Order Specific Question:   Supervising Provider    Answer:   DBo Merino[[phone]   Face-to-face time spent with patient was 50 minutes. 50% of time was spent in counseling and coordination of care.  Follow-Up Instructions: Return in about 3 months (around 02/25/2017) for RA severe;Orencia q 4wks;stopped mtx & restarted today; hand pain; rtc 3 mos for left 3rd pip inj; .   NEliezer Lofts PA-C  Note - This record has been created using [employer]  Chart creation errors have been sought, but may not always  have been located. Such creation errors do not reflect on  the standard of medical care.

## 2016-11-28 ENCOUNTER — Encounter: Payer: Self-pay | Admitting: Rheumatology

## 2016-11-28 ENCOUNTER — Other Ambulatory Visit (HOSPITAL_COMMUNITY): Payer: Self-pay | Admitting: *Deleted

## 2016-11-28 ENCOUNTER — Ambulatory Visit (INDEPENDENT_AMBULATORY_CARE_PROVIDER_SITE_OTHER): Payer: BLUE CROSS/BLUE SHIELD | Admitting: Rheumatology

## 2016-11-28 VITALS — BP 102/58 | HR 68 | Resp 14 | Ht 66.0 in | Wt 134.0 lb

## 2016-11-28 DIAGNOSIS — R112 Nausea with vomiting, unspecified: Secondary | ICD-10-CM | POA: Diagnosis not present

## 2016-11-28 DIAGNOSIS — M79641 Pain in right hand: Secondary | ICD-10-CM

## 2016-11-28 DIAGNOSIS — M79642 Pain in left hand: Secondary | ICD-10-CM | POA: Diagnosis not present

## 2016-11-28 DIAGNOSIS — M0579 Rheumatoid arthritis with rheumatoid factor of multiple sites without organ or systems involvement: Secondary | ICD-10-CM

## 2016-11-28 DIAGNOSIS — Z79899 Other long term (current) drug therapy: Secondary | ICD-10-CM | POA: Diagnosis not present

## 2016-11-28 MED ORDER — FOLIC ACID 1 MG PO TABS: 2.0000 mg | ORAL_TABLET | Freq: Every day | ORAL | 4 refills | Status: DC

## 2016-11-28 MED ORDER — METHOTREXATE SODIUM CHEMO INJECTION 50 MG/2ML: 15.0000 mg | INTRAMUSCULAR | 0 refills | Status: DC

## 2016-11-28 MED ORDER — "TUBERCULIN-ALLERGY SYRINGES 27G X 1/2"" 1 ML KIT": 1.0000 | PACK | 4 refills | Status: AC

## 2016-11-28 MED ORDER — ONDANSETRON HCL 4 MG PO TABS: 4.0000 mg | ORAL_TABLET | Freq: Three times a day (TID) | ORAL | 2 refills | Status: DC | PRN

## 2016-11-28 NOTE — Progress Notes (Signed)
Pharmacy Note Subjective: Patient presents today to the [facility] to see Mr. Leane Call.  Patient is currently prescribed IV Orencia.  She is no longer taking oral methotrexate due to GI side effects.  I counseled patient on injectable methotrexate previously on 07/06/16.  Patient reports she did not initiate injectable methotrexate at that time.  Patient seen by the pharmacist for counseling on injectable methotrexate today at the request of Mr. Leane Call.    Objective: CBC    Component Value Date/Time   WBC 5.2 11/01/2016 0956   RBC 4.16 11/01/2016 0956   HGB 12.6 11/01/2016 0956   HCT 37.6 11/01/2016 0956   PLT 236 11/01/2016 0956   MCV 90.4 11/01/2016 0956   MCH 30.3 11/01/2016 0956   MCHC 33.5 11/01/2016 0956   RDW 12.4 11/01/2016 0956   LYMPHSABS 1.5 09/06/2016 0959   MONOABS 0.3 09/06/2016 0959   EOSABS 0.1 09/06/2016 0959   BASOSABS 0.0 09/06/2016 0959   CMP     Component Value Date/Time   NA 139 11/01/2016 0956   K 4.0 11/01/2016 0956   CL 108 11/01/2016 0956   CO2 25 11/01/2016 0956   GLUCOSE 116 (H) 11/01/2016 0956   BUN 11 11/01/2016 0956   CREATININE 0.64 11/01/2016 0956   CALCIUM 8.8 (L) 11/01/2016 0956   PROT 6.5 11/01/2016 0956   ALBUMIN 3.9 11/01/2016 0956   AST 19 11/01/2016 0956   ALT 13 (L) 11/01/2016 0956   ALKPHOS 67 11/01/2016 0956   BILITOT 0.5 11/01/2016 0956   GFRNONAA >60 11/01/2016 0956   GFRAA >60 11/01/2016 0956   TB Gold: negative (10/04/17) Hepatitis panel: negative (09/23/15) HIV: negative (09/23/15)  Chest-xray:  09/23/15: "IMPRESSION: No active cardiopulmonary disease."  Assessment/Plan:  Patient was counseled on the purpose, proper use, and adverse effects of methotrexate including nausea, infection, and signs and symptoms of pneumonitis.  Reviewed instructions with patient to take methotrexate weekly along with folic acid daily.  Discussed the importance of frequent monitoring of kidney and liver function and blood counts.   Counseled patient to avoid NSAIDs and alcohol while on methotrexate.  Provided patient with educational materials on methotrexate and answered all questions.  Advised patient to get annual influenza vaccine and to get a pneumococcal vaccine if patient has not already had one.  Patient voiced understanding.  Patient consented to methotrexate use on 09/22/15.  Educated patient on how to use a vial and syringe and reviewed injection technique with patient.  Patient was able to demonstrate proper technique for injections using vial and syringe.  Provided patient on educational material regarding injection technique and storage of methotrexate.     Lilla Shook, Pharm.D., BCPS Clinical Pharmacist Pager: [phone] Phone: [phone] 11/28/2016 12:00 PM

## 2016-11-29 ENCOUNTER — Ambulatory Visit (HOSPITAL_COMMUNITY)
Admission: RE | Admit: 2016-11-29 | Discharge: 2016-11-29 | Disposition: A | Discharge status: Home / Self Care | Payer: BLUE CROSS/BLUE SHIELD | Source: Ambulatory Visit | Attending: Rheumatology | Admitting: Rheumatology

## 2016-11-29 DIAGNOSIS — M0579 Rheumatoid arthritis with rheumatoid factor of multiple sites without organ or systems involvement: Secondary | ICD-10-CM

## 2016-11-29 MED ORDER — ACETAMINOPHEN 325 MG PO TABS: 650.0000 mg | ORAL_TABLET | ORAL | Status: DC

## 2016-11-29 MED ORDER — DIPHENHYDRAMINE HCL 25 MG PO CAPS: 25.0000 mg | ORAL_CAPSULE | ORAL | Status: DC

## 2016-11-29 MED ORDER — SODIUM CHLORIDE 0.9 % IV SOLN
500.0000 mg | INTRAVENOUS | Status: DC
  Administered 2016-11-29: 500 mg via INTRAVENOUS
  Filled 2016-11-29: qty 20

## 2016-12-06 ENCOUNTER — Telehealth: Payer: Self-pay | Admitting: Rheumatology

## 2016-12-06 NOTE — Telephone Encounter (Signed)
Based on the symptoms described, on autoimmune workup for Sjogren's can be done after making an appointment with Dr. Corliss Skains and discussing symptoms and history. If appropriate we can order autoimmune workup.Patient's initial visit with our office was 09/22/2015. Her symptoms of dry eyes or dry mouth, vaginal dryness was not an issue at that time and she was worked up appropriately for arthritis.Patient will need an office visit to discuss this further and for lab work to be done if appropriate.

## 2016-12-06 NOTE — Telephone Encounter (Signed)
Patient called and wanted to talk to Dr. Eustace Quail about some symptoms she is having.  Cb#[phone].  Thank you

## 2016-12-06 NOTE — Telephone Encounter (Signed)
Patient states she has been experiencing dry eyes and dry mouth and vaginal dryness as well as a dry cough. Patient states she has done some researched and feels that she may have secondary Sjogren's Syndrome. Patient is on Orencia. Patient has seen to the eye doctor and was given drops for her eyes. Patient states she would like recommendations on what she should do.

## 2016-12-07 NOTE — Telephone Encounter (Signed)
Patient advised of recommendation and transferred to the front to schedule patient.

## 2016-12-07 NOTE — Telephone Encounter (Signed)
Attempted to contact the patient and left message for patient to call the office.  

## 2016-12-19 ENCOUNTER — Other Ambulatory Visit: Payer: Self-pay | Admitting: Radiology

## 2016-12-19 DIAGNOSIS — M0579 Rheumatoid arthritis with rheumatoid factor of multiple sites without organ or systems involvement: Secondary | ICD-10-CM

## 2016-12-19 NOTE — Progress Notes (Signed)
My previous orders for CBC and CMP with infusions are not signed/ held so they have been placed again.

## 2016-12-22 ENCOUNTER — Telehealth: Payer: Self-pay | Admitting: Rheumatology

## 2016-12-22 NOTE — Telephone Encounter (Signed)
Patient has a question about taking her medicine tonight.  She is going to give herself the MTX injection tonight and is having some questions in regards to her medicine.

## 2016-12-22 NOTE — Telephone Encounter (Signed)
Attempted to contact the patient and left message for patient to call the office.  

## 2016-12-27 ENCOUNTER — Ambulatory Visit (HOSPITAL_COMMUNITY): Admission: RE | Admit: 2016-12-27 | Payer: BLUE CROSS/BLUE SHIELD | Source: Ambulatory Visit

## 2016-12-27 ENCOUNTER — Telehealth: Payer: Self-pay | Admitting: Rheumatology

## 2016-12-27 NOTE — Telephone Encounter (Signed)
Opened in error

## 2016-12-27 NOTE — Telephone Encounter (Signed)
Patient on ATB and held MTX. Infusion will be held for 2 weeks per Dr. Corliss Skains.

## 2016-12-27 NOTE — Progress Notes (Signed)
PT came in today for her scheduled Orencia infusion.  Pt told me that she has been on antibiotics since Friday.  I called Dr Corliss Skains and I talked to Sue Lush and she said to hold the medicine for 2 weeks

## 2017-01-03 DIAGNOSIS — Z79899 Other long term (current) drug therapy: Secondary | ICD-10-CM | POA: Insufficient documentation

## 2017-01-03 NOTE — Progress Notes (Signed)
Office Visit Note  Patient: Dawn [name]             Date of Birth: 1981-07-29           MRN: [id number]             PCP: [facility] Referring: P[facility], [facility]* Visit Date: 01/10/2017 Occupation: _0 @    Subjective:  Hand pain.   History of Present Illness: Dawn [name] is a 36 y.o. female with history of sero positive rheumatoid arthritis. She states she tried methotrexate for about 3 weeks but had to discontinue it due to vaginal infection. She was treated with the antibiotics and antifungal medications. She also held Orencia infusion for 2 weeks. She's not having very many joint symptoms currently and is hesitant to go back on methotrexate as she had problems with vaginal infections in the past. She will be resuming her Orencia infusion next week. She's also complaining of increased problems with dry mouth and dry eyes. She was seen by ophthalmologist and was given some eyedrops. She continues to have some pain and discomfort in her left third PIP joint.  Activities of Daily Living:  Patient reports morning stiffness for 0 minute.   Patient Denies nocturnal pain.  Difficulty dressing/grooming: Denies Difficulty climbing stairs: Denies Difficulty getting out of chair: Denies Difficulty using hands for taps, buttons, cutlery, and/or writing: Denies   Review of Systems  Constitutional: Positive for fatigue. Negative for night sweats, weight gain, weight loss and weakness.  HENT: Positive for mouth dryness. Negative for mouth sores, trouble swallowing, trouble swallowing and nose dryness.   Eyes: Positive for dryness. Negative for pain, redness and visual disturbance.  Respiratory: Negative for cough, shortness of breath and difficulty breathing.   Cardiovascular: Negative for chest pain, palpitations, hypertension, irregular heartbeat and swelling in legs/feet.  Gastrointestinal: Negative for blood in stool, constipation and diarrhea.    Endocrine: Negative for increased urination.  Genitourinary: Negative for vaginal dryness.  Musculoskeletal: Positive for arthralgias and joint pain. Negative for joint swelling, myalgias, muscle weakness, morning stiffness, muscle tenderness and myalgias.  Skin: Negative for color change, rash, hair loss, skin tightness, ulcers and sensitivity to sunlight.  Allergic/Immunologic: Negative for susceptible to infections.  Neurological: Negative for dizziness, memory loss and night sweats.  Hematological: Negative for swollen glands.  Psychiatric/Behavioral: Negative for depressed mood and sleep disturbance. The patient is not nervous/anxious.     PMFS History:  Patient Active Problem List   Diagnosis Date Noted  . High risk medication use 01/03/2017  . Rheumatoid arthritis with rheumatoid factor of multiple sites without organ or systems involvement ([place]) 06/26/2016  . Status post induction of labor 01/13/2015  . Term pregnancy 01/13/2015  . Spontaneous vaginal delivery 01/13/2015    Past Medical History:  Diagnosis Date  . Arthritis   . H/O laparoscopy 05/05/13  . History of ectopic pregnancy 05/05/13  . Hx of unilateral salpingectomy 05/05/13   Left  . Hx of wisdom tooth extraction   . Medical history non-contributory     History reviewed. No pertinent family history. Past Surgical History:  Procedure Laterality Date  . LAPAROSCOPY N/A 05/06/2013   Procedure: LAPAROSCOPY OPERATIVE, Left Salpingectomy, Removal Ectopic Pregnancy;  Surgeon: Sharene Butters, MD;  Location: [facility];  Service: Gynecology;  Laterality: N/A;  . WISDOM TOOTH EXTRACTION     Social History   Social History Narrative  . No narrative on file     Objective: Vital Signs: BP 102/60  Pulse 78   Wt 134 lb (60.8 kg)   LMP 12/27/2016   BMI (P) 21.63 kg/m    Physical Exam  Constitutional: She is oriented to person, place, and time. She appears well-developed and well-nourished.  HENT:  Head:  Normocephalic and atraumatic.  Eyes: Conjunctivae and EOM are normal.  Neck: Normal range of motion.  Cardiovascular: Normal rate, regular rhythm, normal heart sounds and intact distal pulses.   Pulmonary/Chest: Effort normal and breath sounds normal.  Abdominal: Soft. Bowel sounds are normal.  Lymphadenopathy:    She has no cervical adenopathy.  Neurological: She is alert and oriented to person, place, and time.  Skin: Skin is warm and dry. Capillary refill takes less than 2 seconds.  Psychiatric: She has a normal mood and affect. Her behavior is normal.  Nursing note and vitals reviewed.    Musculoskeletal Exam:   CDAI Exam: CDAI Homunculus Exam:   Tenderness:  Left hand: 3rd PIP  Swelling:  Left hand: 3rd PIP  Joint Counts:  CDAI Tender Joint count: 1 CDAI Swollen Joint count: 1  Global Assessments:  Patient Global Assessment: 1 Provider Global Assessment: 1  CDAI Calculated Score: 4    Investigation: Findings:  10/04/2016 negative TB gold    Hospital Outpatient Visit on 11/01/2016  Component Date Value Ref Range Status  . WBC 11/01/2016 5.2  4.0 - 10.5 K/uL Final  . RBC 11/01/2016 4.16  3.87 - 5.11 MIL/uL Final  . Hemoglobin 11/01/2016 12.6  12.0 - 15.0 g/dL Final  . HCT 11/01/2016 37.6  36.0 - 46.0 % Final  . MCV 11/01/2016 90.4  78.0 - 100.0 fL Final  . MCH 11/01/2016 30.3  26.0 - 34.0 pg Final  . MCHC 11/01/2016 33.5  30.0 - 36.0 g/dL Final  . RDW 11/01/2016 12.4  11.5 - 15.5 % Final  . Platelets 11/01/2016 236  150 - 400 K/uL Final  . Sodium 11/01/2016 139  135 - 145 mmol/L Final  . Potassium 11/01/2016 4.0  3.5 - 5.1 mmol/L Final  . Chloride 11/01/2016 108  101 - 111 mmol/L Final  . CO2 11/01/2016 25  22 - 32 mmol/L Final  . Glucose, Bld 11/01/2016 116* 65 - 99 mg/dL Final  . BUN 11/01/2016 11  6 - 20 mg/dL Final  . Creatinine, Ser 11/01/2016 0.64  0.44 - 1.00 mg/dL Final  . Calcium 11/01/2016 8.8* 8.9 - 10.3 mg/dL Final  . Total Protein  11/01/2016 6.5  6.5 - 8.1 g/dL Final  . Albumin 11/01/2016 3.9  3.5 - 5.0 g/dL Final  . AST 11/01/2016 19  15 - 41 U/L Final  . ALT 11/01/2016 13* 14 - 54 U/L Final  . Alkaline Phosphatase 11/01/2016 67  38 - 126 U/L Final  . Total Bilirubin 11/01/2016 0.5  0.3 - 1.2 mg/dL Final  . GFR calc non Af Amer 11/01/2016 >60  >60 mL/min Final  . GFR calc Af Amer 11/01/2016 >60  >60 mL/min Final   Comment: (NOTE) The eGFR has been calculated using the CKD EPI equation. This calculation has not been validated in all clinical situations. eGFR's persistently <60 mL/min signify possible Chronic Kidney Disease.   . Anion gap 11/01/2016 6  5 - 15 Final   Imaging: No results found.  Speciality Comments: No specialty comments available.    Procedures:  Small Joint Inj Date/Time: 01/10/2017 12:45 PM Performed by: Bo Merino Authorized by: Bo Merino   Consent Given by:  Parent and patient Site marked: the procedure site  was marked   Timeout: prior to procedure the correct patient, procedure, and site was verified   Indications:  Joint swelling and pain Location:  Long finger Site:  L long PIP Prep: patient was prepped and draped in usual sterile fashion   Needle Size:  27 G Approach:  Dorsal Ultrasound Guided: Yes   Fluoroscopic Guidance: No   Medications:  0.3 mL lidocaine 1 %; 10 mg triamcinolone acetonide 40 MG/ML Aspiration Attempted: No   Patient tolerance:  Patient tolerated the procedure well with no immediate complications   Allergies: Patient has no known allergies.   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 Different treatment options were discussed per  request we decided to proceed with cortisone injection under ultrasound guidance which is described above. She tolerated the procedure well. A splint was applied. She developed increased infections on methotrexate and does not want to continue that. She believes she does quite well on IV Orencia. She will discontinue methotrexate for right now.  High risk medication use - Orencia IV - Plan: CBC with Differential/Platelet, COMPLETE METABOLIC PANEL WITH GFR  Sicca syndrome, unspecified ([place]) -she's been having increased sicca symptoms. We'll 2 following labs to evaluate that further. Plan: [id number] ENA PANEL, ANA    Orders: Orders Placed This Encounter  Procedures  . Small Joint Injection/Arthrocentesis  . CBC with Differential/Platelet  . COMPLETE METABOLIC PANEL WITH GFR  . [id number] ENA PANEL  . ANA  . Cyclic citrul peptide antibody, IgG   No orders of the defined types were placed in this encounter.   Face-to-face time spent with patient was 30 minutes. 50% of time was spent in counseling and coordination of care.  Follow-Up Instructions: Return in about 4 months (around 05/12/2017) for Rheumatoid arthritis.   Bo Merino, MD  Note - This record has been created using Editor, commissioning.  Chart creation errors have been sought, but may not always  have been located. Such creation errors do not reflect on  the standard of medical care.

## 2017-01-10 ENCOUNTER — Ambulatory Visit (INDEPENDENT_AMBULATORY_CARE_PROVIDER_SITE_OTHER): Payer: BLUE CROSS/BLUE SHIELD | Admitting: Rheumatology

## 2017-01-10 ENCOUNTER — Ambulatory Visit (HOSPITAL_COMMUNITY): Admission: RE | Admit: 2017-01-10 | Payer: BLUE CROSS/BLUE SHIELD | Source: Ambulatory Visit

## 2017-01-10 ENCOUNTER — Encounter: Payer: Self-pay | Admitting: Rheumatology

## 2017-01-10 VITALS — BP 102/60 | HR 78 | Wt 134.0 lb

## 2017-01-10 DIAGNOSIS — M79642 Pain in left hand: Secondary | ICD-10-CM

## 2017-01-10 DIAGNOSIS — M0579 Rheumatoid arthritis with rheumatoid factor of multiple sites without organ or systems involvement: Secondary | ICD-10-CM | POA: Diagnosis not present

## 2017-01-10 DIAGNOSIS — M3500 Sicca syndrome, unspecified: Secondary | ICD-10-CM | POA: Diagnosis not present

## 2017-01-10 DIAGNOSIS — Z79899 Other long term (current) drug therapy: Secondary | ICD-10-CM

## 2017-01-10 LAB — CBC WITH DIFFERENTIAL/PLATELET
Basophils Absolute: 56 cells/uL (ref 0–200)
Basophils Relative: 1 %
EOS PCT: 1 %
Eosinophils Absolute: 56 cells/uL (ref 15–500)
HCT: 39.2 % (ref 35.0–45.0)
Hemoglobin: 13.2 g/dL (ref 11.7–15.5)
LYMPHS PCT: 36 %
Lymphs Abs: 2016 cells/uL (ref 850–3900)
MCH: 31.1 pg (ref 27.0–33.0)
MCHC: 33.7 g/dL (ref 32.0–36.0)
MCV: 92.5 fL (ref 80.0–100.0)
MPV: 9.3 fL (ref 7.5–12.5)
Monocytes Absolute: 392 cells/uL (ref 200–950)
Monocytes Relative: 7 %
NEUTROS PCT: 55 %
Neutro Abs: 3080 cells/uL (ref 1500–7800)
Platelets: 254 10*3/uL (ref 140–400)
RBC: 4.24 MIL/uL (ref 3.80–5.10)
RDW: 14.3 % (ref 11.0–15.0)
WBC: 5.6 10*3/uL (ref 3.8–10.8)

## 2017-01-10 LAB — COMPLETE METABOLIC PANEL WITH GFR
ALT: 8 U/L (ref 6–29)
AST: 14 U/L (ref 10–30)
Albumin: 4.4 g/dL (ref 3.6–5.1)
Alkaline Phosphatase: 73 U/L (ref 33–115)
BUN: 13 mg/dL (ref 7–25)
CALCIUM: 9.3 mg/dL (ref 8.6–10.2)
CHLORIDE: 106 mmol/L (ref 98–110)
CO2: 24 mmol/L (ref 20–31)
CREATININE: 0.66 mg/dL (ref 0.50–1.10)
GFR, Est African American: >89 mL/min (ref >=60)
Glucose, Bld: 86 mg/dL (ref 65–99)
POTASSIUM: 4.5 mmol/L (ref 3.5–5.3)
SODIUM: 140 mmol/L (ref 135–146)
Total Bilirubin: 0.4 mg/dL (ref 0.2–1.2)
Total Protein: 6.9 g/dL (ref 6.1–8.1)

## 2017-01-10 MED ORDER — LIDOCAINE HCL 1 % IJ SOLN
0.3000 mL | INTRAMUSCULAR | Status: AC | PRN
  Administered 2017-01-10: .3 mL

## 2017-01-10 MED ORDER — TRIAMCINOLONE ACETONIDE 40 MG/ML IJ SUSP
10.0000 mg | INTRAMUSCULAR | Status: AC | PRN
  Administered 2017-01-10: 10 mg via INTRA_ARTICULAR

## 2017-01-11 ENCOUNTER — Encounter: Payer: Self-pay | Admitting: Rheumatology

## 2017-01-11 LAB — CP5000020 ENA PANEL
ENA SM AB SER-ACNC: NEGATIVE
RIBONUCLEIC PROTEIN(ENA) ANTIBODY, IGG: NEGATIVE
SCLERODERMA (SCL-70) (ENA) ANTIBODY, IGG: NEGATIVE
SSA (RO) (ENA) ANTIBODY, IGG: NEGATIVE
SSB (LA) (ENA) ANTIBODY, IGG: NEGATIVE
ds DNA Ab: <1 IU/mL

## 2017-01-11 LAB — ANA: Anti Nuclear Antibody(ANA): POSITIVE — AB

## 2017-01-11 LAB — CYCLIC CITRUL PEPTIDE ANTIBODY, IGG: Cyclic Citrullin Peptide Ab: >250 Units — ABNORMAL HIGH

## 2017-01-11 LAB — ANTI-NUCLEAR AB-TITER (ANA TITER): ANA Titer 1: 1:40 {titer} — ABNORMAL HIGH

## 2017-01-11 NOTE — Progress Notes (Signed)
Labs c/w RA

## 2017-01-15 ENCOUNTER — Ambulatory Visit (HOSPITAL_COMMUNITY)
Admission: RE | Admit: 2017-01-15 | Discharge: 2017-01-15 | Disposition: A | Discharge status: Home / Self Care | Payer: BLUE CROSS/BLUE SHIELD | Source: Ambulatory Visit | Attending: Rheumatology | Admitting: Rheumatology

## 2017-01-15 ENCOUNTER — Telehealth: Payer: Self-pay | Admitting: Rheumatology

## 2017-01-15 DIAGNOSIS — M0579 Rheumatoid arthritis with rheumatoid factor of multiple sites without organ or systems involvement: Secondary | ICD-10-CM | POA: Insufficient documentation

## 2017-01-15 MED ORDER — ACETAMINOPHEN 325 MG PO TABS: 650.0000 mg | ORAL_TABLET | ORAL | Status: DC

## 2017-01-15 MED ORDER — SODIUM CHLORIDE 0.9 % IV SOLN
500.0000 mg | INTRAVENOUS | Status: AC
  Administered 2017-01-15: 500 mg via INTRAVENOUS
  Filled 2017-01-15: qty 20

## 2017-01-15 MED ORDER — DIPHENHYDRAMINE HCL 25 MG PO CAPS: 25.0000 mg | ORAL_CAPSULE | ORAL | Status: DC

## 2017-01-15 NOTE — Telephone Encounter (Signed)
Patient advised of lab results. Patient specifically interested in knowing if her labs for Sjogren's were negative. Patient advised they were.

## 2017-01-15 NOTE — Telephone Encounter (Signed)
Patient called about lab results from last week. Please call patient back.

## 2017-02-02 ENCOUNTER — Telehealth (INDEPENDENT_AMBULATORY_CARE_PROVIDER_SITE_OTHER): Payer: Self-pay | Admitting: *Deleted

## 2017-02-02 NOTE — Telephone Encounter (Signed)
Patient called in this morning in regards to an issue she is having with getting some of her prescriptions due to her insurance. She would like to know if there would be any alternatives for her at all? Thank you Her CB # [phone].

## 2017-02-02 NOTE — Telephone Encounter (Signed)
Can we apply for this and advise patient of what the cost would be for her please.

## 2017-02-02 NOTE — Telephone Encounter (Signed)
Yes, we can switch her to sq Orencia. We will have to apply for it.

## 2017-02-02 NOTE — Telephone Encounter (Signed)
Patient states she is not going to be able to keep doing the Orencia infusions because her cost for the infusion after the insurance and the orencia assistance is $ 800 per infusion. Patient states she is unable to afford that. Patient states this is not cost for the medication this is cost for the hospital use. Patient would like to know if the Orenica injection would be an option for her being as the Orencia work really well for her.

## 2017-02-05 NOTE — Telephone Encounter (Signed)
When reviewing patient's insurance, PREFERRED biologics include: Enbrel (etanercept), Humira (adalimumab), Remicade (infliximab), and golimumab (Simponi or Simponi Aria).  Reviewed patient's chart.  She had inadequate response to methotrexate tablet monotherapy.  She had previous trial of Humira, but the medication was discontinued "She states she took Humira 2 injections and discontinued, that she does not like the injections, they caused some localized reaction and also they were very painful, she has needle phobia and she did not want to take that."  I called patient to discuss SQ Orencia, but there was no answer.    I submitted a PA for Orencia SQ through Cover My Meds.  If approved, we can provide patient an Orencia coupon card that should assist with cost of Orencia (as low as $5 out-of-pocket drug cost per one-month supply).

## 2017-02-06 NOTE — Telephone Encounter (Signed)
Consider Simponi SQ

## 2017-02-06 NOTE — Telephone Encounter (Signed)
Orencia SQ was denied by patient's insurance.  She must try and fail TWO preferred biologics which include: Enbrel (etanercept), Humira (adalimumab), Remicade (infliximab), and golimumab (Simponi or Simponi Aria).  Patient had previous trial of Humira, but the medication was discontinued "She states she took Humira 2 injections and discontinued, that she does not like the injections, they caused some localized reaction and also they were very painful, she has needle phobia and she did not want to take that."  Dr. Corliss Skains, do you want to try another TNF?  Simponi SQ may be the best option of the preferred medications due to less frequent injections.

## 2017-02-06 NOTE — Telephone Encounter (Signed)
I spoke to patient.  Advised Orencia SQ was denied.  Discussed that two Preferred medications must be used (patient has tried one preferred agent, Humira).  Counseled patient briefly on purpose, proper use, and adverse effects of Simponi.  Patient agrees to trial of Simponi.  Will apply for Simponi.  If approved, will schedule nurse visit for Simponi.  Will need to provider further medication education and consent patient before initiation of medication.  Patient's next Orencia infusion is scheduled for 02/12/17, but she does not plan to infuse.  Advised patient to cancel infusion.  Advised patient that Simponi cannot be administrered before 02/12/17.  Patient voiced understanding and denies any questions.    Lilla Shook, Pharm.D., BCPS, CPP Clinical Pharmacist Pager: [phone] Phone: [phone] 02/06/2017 5:03 PM

## 2017-02-07 ENCOUNTER — Telehealth: Payer: Self-pay | Admitting: Pharmacist

## 2017-02-07 NOTE — Telephone Encounter (Addendum)
Simponi SQ was approved by patient's insurance (Case ID [id number], Effective date 02/06/17 to 02/06/18).  I have advised patient.  Assisted her in signing up for Simponi coupon card (ID [id number], BIN [id number], Group [id number]).  Will have patient's initial Simponi injection delivered to the clinic for administration.  Patient was unable to schedule nurse visit right now.  She requested we call back tomorrow to schedule nurse visit.  Nurse visit must be on or after 02/12/17 (4 weeks after most recent Orencia infusion).     Lilla Shook, Pharm.D., BCPS, CPP Clinical Pharmacist Pager: [phone] Phone: [phone] 02/07/2017 2:22 PM

## 2017-02-08 NOTE — Telephone Encounter (Signed)
ok 

## 2017-02-08 NOTE — Telephone Encounter (Signed)
Called patient to discuss scheduling nurse visit.  Left a message asking her to call me back.

## 2017-02-08 NOTE — Telephone Encounter (Signed)
Simponi SQ approved by patient's insurance.  Okay to proceed with Simponi?  Patient has cancelled Orencia infusion scheduled for 02/12/17 and will schedule nurse visit to start Simponi at least 4 weeks after most recent infusion.

## 2017-02-12 ENCOUNTER — Encounter (HOSPITAL_COMMUNITY): Payer: BLUE CROSS/BLUE SHIELD

## 2017-02-15 MED ORDER — GOLIMUMAB 50 MG/0.5ML ~~LOC~~ SOAJ: 50.0000 mg | SUBCUTANEOUS | 0 refills | Status: DC

## 2017-02-15 NOTE — Telephone Encounter (Signed)
Spoke to [name] regarding nurse visit for [employer].  Advised her copay card was approved and copay should be $5.  Will send prescription and request delivery to clinic for office administration.  Patient will call back later today to schedule nursing visit for consent and injection.  Patient denies any further questions at this time.   Lilla Shook, Pharm.D., BCPS, CPP Clinical Pharmacist Pager: [phone] Phone: [phone] 02/15/2017 9:23 AM

## 2017-02-21 DIAGNOSIS — M3500 Sicca syndrome, unspecified: Secondary | ICD-10-CM | POA: Insufficient documentation

## 2017-02-21 NOTE — Progress Notes (Deleted)
Office Visit Note  Patient: Dawn [name]             Date of Birth: [birth year]           MRN: [id number]             PCP: [facility] Referring: [facility], [facility]* Visit Date: 02/26/2017 Occupation: _0 @    Subjective:  No chief complaint on file.   History of Present Illness: Dawn [name] is a 36 y.o. female ***   Activities of Daily Living:  Patient reports morning stiffness for *** {minute/hour:19697}.   Patient {ACTIONS;DENIES/REPORTS:21021675::"Denies"} nocturnal pain.  Difficulty dressing/grooming: {ACTIONS;DENIES/REPORTS:21021675::"Denies"} Difficulty climbing stairs: {ACTIONS;DENIES/REPORTS:21021675::"Denies"} Difficulty getting out of chair: {ACTIONS;DENIES/REPORTS:21021675::"Denies"} Difficulty using hands for taps, buttons, cutlery, and/or writing: {ACTIONS;DENIES/REPORTS:21021675::"Denies"}   No Rheumatology ROS completed.   PMFS History:  Patient Active Problem List   Diagnosis Date Noted  . Sicca syndrome, unspecified ([place]) 02/21/2017  . High risk medication use 01/03/2017  . Rheumatoid arthritis with rheumatoid factor of multiple sites without organ or systems involvement ([place]) 06/26/2016  . Status post induction of labor 01/13/2015  . Term pregnancy 01/13/2015  . Spontaneous vaginal delivery 01/13/2015    Past Medical History:  Diagnosis Date  . Arthritis   . H/O laparoscopy 05/05/13  . History of ectopic pregnancy 05/05/13  . Hx of unilateral salpingectomy 05/05/13   Left  . Hx of wisdom tooth extraction   . Medical history non-contributory     No family history on file. Past Surgical History:  Procedure Laterality Date  . LAPAROSCOPY N/A 05/06/2013   Procedure: LAPAROSCOPY OPERATIVE, Left Salpingectomy, Removal Ectopic Pregnancy;  Surgeon: Sharene Butters, MD;  Location: [facility];  Service: Gynecology;  Laterality: N/A;  . WISDOM TOOTH EXTRACTION     Social History   Social History Narrative  . No narrative on  file     Objective: Vital Signs: There were no vitals taken for this visit.   Physical Exam   Musculoskeletal Exam: ***  CDAI Exam: No CDAI exam completed.    Investigation: Findings:  01/10/17 ANA + low titer 1:40; ENA negative, CCP greater than 250  Office Visit on 01/10/2017  Component Date Value Ref Range Status  . WBC 01/10/2017 5.6  3.8 - 10.8 K/uL Final  . RBC 01/10/2017 4.24  3.80 - 5.10 MIL/uL Final  . Hemoglobin 01/10/2017 13.2  11.7 - 15.5 g/dL Final  . HCT 01/10/2017 39.2  35.0 - 45.0 % Final  . MCV 01/10/2017 92.5  80.0 - 100.0 fL Final  . MCH 01/10/2017 31.1  27.0 - 33.0 pg Final  . MCHC 01/10/2017 33.7  32.0 - 36.0 g/dL Final  . RDW 01/10/2017 14.3  11.0 - 15.0 % Final  . Platelets 01/10/2017 254  140 - 400 K/uL Final  . MPV 01/10/2017 9.3  7.5 - 12.5 fL Final  . Neutro Abs 01/10/2017 3080  1,500 - 7,800 cells/uL Final  . Lymphs Abs 01/10/2017 2016  850 - 3,900 cells/uL Final  . Monocytes Absolute 01/10/2017 392  200 - 950 cells/uL Final  . Eosinophils Absolute 01/10/2017 56  15 - 500 cells/uL Final  . Basophils Absolute 01/10/2017 56  0 - 200 cells/uL Final  . Neutrophils Relative % 01/10/2017 55  % Final  . Lymphocytes Relative 01/10/2017 36  % Final  . Monocytes Relative 01/10/2017 7  % Final  . Eosinophils Relative 01/10/2017 1  % Final  . Basophils Relative 01/10/2017 1  % Final  . Smear  Review 01/10/2017 Criteria for review not met   Final  . Sodium 01/10/2017 140  135 - 146 mmol/L Final  . Potassium 01/10/2017 4.5  3.5 - 5.3 mmol/L Final  . Chloride 01/10/2017 106  98 - 110 mmol/L Final  . CO2 01/10/2017 24  20 - 31 mmol/L Final  . Glucose, Bld 01/10/2017 86  65 - 99 mg/dL Final  . BUN 01/10/2017 13  7 - 25 mg/dL Final  . Creat 01/10/2017 0.66  0.50 - 1.10 mg/dL Final  . Total Bilirubin 01/10/2017 0.4  0.2 - 1.2 mg/dL Final  . Alkaline Phosphatase 01/10/2017 73  33 - 115 U/L Final  . AST 01/10/2017 14  10 - 30 U/L Final  . ALT 01/10/2017 8  6 -  29 U/L Final  . Total Protein 01/10/2017 6.9  6.1 - 8.1 g/dL Final  . Albumin 01/10/2017 4.4  3.6 - 5.1 g/dL Final  . Calcium 01/10/2017 9.3  8.6 - 10.2 mg/dL Final  . GFR, Est African American 01/10/2017 >89  >=60 mL/min Final  . GFR, Est Non African American 01/10/2017 >89  >=60 mL/min Final  . ds DNA Ab 01/10/2017 <1  IU/mL Final   Comment:                                 IU/mL       Interpretation                               < or = 4    Negative                               5-9         Indeterminate                               > or = 10   Positive     . Ribonucleic Protein(ENA) Antibody,* 01/10/2017 <1.0 NEG  <1.0 NEG AI Final  . Scleroderma (Scl-70) (ENA) Antibod* 01/10/2017 <1.0 NEG  <1.0 NEG AI Final  . SSA (Ro) (ENA) Antibody, IgG 01/10/2017 <1.0 NEG  <1.0 NEG AI Final  . SSB (La) (ENA) Antibody, IgG 01/10/2017 <1.0 NEG  <1.0 NEG AI Final  . ENA SM Ab Ser-aCnc 01/10/2017 <1.0 NEG  <1.0 NEG AI Final  . Anit Nuclear Antibody(ANA) 01/10/2017 POS* NEGATIVE Final  . Cyclic Citrullin Peptide Ab 01/10/2017 >250* Units Final   Comment:   Reference Range Negative               < 20 Weak Positive            20 - 39 Moderate Positive        40 - 59 Strong Positive        > 59   . ANA Pattern 1 01/10/2017 HOMOGENEOUS*  Final  . ANA Titer 1 01/10/2017 1:40* titer Final   Comment:           Reference Range           < 1:40      Negative             1:40-1:80 Low Antibody level           > 1:80  Elevated Antibody level      Imaging: No results found.  Speciality Comments: No specialty comments available.    Procedures:  No procedures performed Allergies: Patient has no known allergies.   Assessment / Plan:     Visit Diagnoses: Rheumatoid arthritis with rheumatoid factor of multiple sites without organ or systems involvement ([place]) - + RF +CCP greater than 250   High risk medication use  Sicca syndrome, unspecified ([place])    Orders: No orders of the defined  types were placed in this encounter.  No orders of the defined types were placed in this encounter.   Face-to-face time spent with patient was *** minutes. 50% of time was spent in counseling and coordination of care.  Follow-Up Instructions: No Follow-up on file.   Bo Merino, MD  Note - This record has been created using Editor, commissioning.  Chart creation errors have been sought, but may not always  have been located. Such creation errors do not reflect on  the standard of medical care.

## 2017-02-22 ENCOUNTER — Telehealth: Payer: Self-pay | Admitting: Rheumatology

## 2017-02-22 NOTE — Telephone Encounter (Signed)
Patient scheduled nurse visit for Simponi on 02-28-17.

## 2017-02-26 ENCOUNTER — Ambulatory Visit: Payer: BLUE CROSS/BLUE SHIELD | Admitting: Rheumatology

## 2017-02-27 ENCOUNTER — Telehealth: Payer: Self-pay

## 2017-02-27 NOTE — Telephone Encounter (Signed)
Patient is scheduled for a nursing visit on tomorrow. We still have not received the medication from the pharmacy. The pharmacy has not been able to get in touch with the patient to collect payment. Reached out to the patient to give her the number to the pharmacy. Patient plans to contact the pharmacy when she gets home.   Trynity Skousen, [place], CPhT 9:15 AM

## 2017-02-28 ENCOUNTER — Ambulatory Visit (INDEPENDENT_AMBULATORY_CARE_PROVIDER_SITE_OTHER): Payer: BLUE CROSS/BLUE SHIELD | Admitting: *Deleted

## 2017-02-28 VITALS — BP 105/68 | HR 81

## 2017-02-28 DIAGNOSIS — M0579 Rheumatoid arthritis with rheumatoid factor of multiple sites without organ or systems involvement: Secondary | ICD-10-CM | POA: Diagnosis not present

## 2017-02-28 MED ORDER — GOLIMUMAB 50 MG/0.5ML ~~LOC~~ SOAJ
50.0000 mg | Freq: Once | SUBCUTANEOUS | Status: AC
  Administered 2017-02-28: 50 mg via SUBCUTANEOUS

## 2017-02-28 MED ORDER — GOLIMUMAB 50 MG/0.5ML ~~LOC~~ SOAJ: 50.0000 mg | SUBCUTANEOUS | 1 refills | Status: DC

## 2017-02-28 NOTE — Progress Notes (Signed)
Pharmacy Note  Subjective: Patient presents today to the [facility] for initial Simponi injection.  Patient is being switched from Orencia infusions to Simponi subcutaneous injections.  Patient confirms her most recent Orencia infusion was greater than 4 weeks ago.  Patient seen by the pharmacist for counseling on Simponi.    Objective: TB Test: negative (10/04/16) Hepatitis B panel and hepatitis C antibody: negative (05/04/2015) HIV: negative (09/23/15)  CBC    Component Value Date/Time   WBC 5.6 01/10/2017 1210   RBC 4.24 01/10/2017 1210   HGB 13.2 01/10/2017 1210   HCT 39.2 01/10/2017 1210   PLT 254 01/10/2017 1210   MCV 92.5 01/10/2017 1210   MCH 31.1 01/10/2017 1210   MCHC 33.7 01/10/2017 1210   RDW 14.3 01/10/2017 1210   LYMPHSABS 2,016 01/10/2017 1210   MONOABS 392 01/10/2017 1210   EOSABS 56 01/10/2017 1210   BASOSABS 56 01/10/2017 1210    Assessment/Plan:  Counseled patient that Simponi is a TNF blocking agent.  Reviewed Simponi dose of 50 mg once a month.  Counseled patient on purpose, proper use, and adverse effects of Simponi.  Reviewed the most common adverse effects including infections and injection site reactions.  Discussed that there is the possibility of an increased risk of malignancy but it is not well understood if this increased risk is due to the medication or the disease state.  Advised patient to get yearly dermatology exams due to risk of skin cancer.  Reviewed the importance of regular labs while on Simponi therapy.  Counseled patient that Simponi should be held prior to scheduled surgery.  Counseled patient to avoid live vaccines while on Simponi.  Advised patient to get annual influenza vaccine and the pneumococcal vaccine as needed.  Provided patient with medication education material and answered all questions.  Patient voiced understanding.  Patient consented to Simponi.  Will upload consent into the media tab.  Reviewed storage instructions  for Simponi.  Advised patient to contact the office to schedule the first Simponi injection once the medication is approved by insurance.  Patient voiced understanding.    Patient was given initial injection in clinic by Dulcy Fanny, LPN.  Patient tolerated the injection well and was monitored for 30 minutes post injection.  Advised patient she will be due for labs a in 1 month then every 2 months.  Also advised patient to schedule follow up visit with Dr. Corliss Skains in 2 months.    Lilla Shook, Pharm.D., BCPS Clinical Pharmacist Pager: [phone] Phone: [phone] 02/28/2017 10:26 AM

## 2017-02-28 NOTE — Patient Instructions (Addendum)
Standing Labs We placed an order today for your standing lab work.    Please come back and get your standing labs in 1 month then every 2 months  We have open lab Monday through Friday from 8:30-11:30 AM and 1:30-4 PM at the office of Dr. Arbutus Ped, PA.   The office is located at [address], [place], [place] [address] No appointment is necessary.   Labs are drawn by [employer].  You may receive a bill from [facility] for your lab work.     Golimumab injection (subcutaneous or intravenous use) What is this medicine? GOLIMUMAB (goe LIM ue mab) is used to treat rheumatoid arthritis, psoriatic arthritis, and ankylosing spondylitis. It is also used to treat ulcerative colitis. This medicine may be used for other purposes; ask your health care provider or pharmacist if you have questions. COMMON BRAND NAME(S): Simponi, SIMPONI ARIA What should I tell my health care provider before I take this medicine? They need to know if you have any of these conditions: -cancer -diabetes -Guillain-Barre syndrome -heart failure -hepatitis B or history of hepatitis B infection -immune system problems -infection or history of infections -low blood counts like low white cell, platelet, or red cell counts -multiple sclerosis -recently received or scheduled to receive a vaccine -tuberculosis, a positive skin test for tuberculosis or have recently been in close contact with someone who has tuberculosis -an unusual reaction to golimumab, other medicines, latex, rubber, foods, dyes, or preservatives -pregnant or trying to get pregnant -breast-feeding How should I use this medicine? This medicine is for infusion into a vein or for injection under the skin. Infusions are given by a health care professional in a hospital or clinic setting. If you are to give your own medicine at home, you will be taught how to prepare and give this medicine under the skin. Use exactly as directed. Take  your medicine at regular intervals. Do not take your medicine more often than directed. It is important that you put your used needles and syringes in a special sharps container. Do not put them in a trash can. If you do not have a sharps container, call your pharmacist or healthcare provider to get one. A special MedGuide will be given to you by the pharmacist with each prescription and refill. Be sure to read this information carefully each time. Talk to your pediatrician regarding the use of this medicine in children. Special care may be needed. Overdosage: If you think you have taken too much of this medicine contact a poison control center or emergency room at once. NOTE: This medicine is only for you. Do not share this medicine with others. What if I miss a dose? If you give your medicine by injection under the skin: If you miss a dose, take it as soon as you can. If it is almost time for your next dose, take only that dose. Do not take double or extra doses. Call your doctor or health care professional if you are not sure how to handle a missed dose. If you are to be given an infusion: It is important not to miss your dose. Call your doctor or health care professional if you are unable to keep an appointment. What may interact with this medicine? Do not take this medicine with any of the following medications: -abatacept -adalimumab -anakinra -certolizumab -etanercept -infliximab -live virus vaccines -rituximab -rilonacept -tocilizumab This medicine may also interact with the following medications: -cyclosporine -theophylline -tofacitinib -vaccines -warfarin This list  may not describe all possible interactions. Give your health care provider a list of all the medicines, herbs, non-prescription drugs, or dietary supplements you use. Also tell them if you smoke, drink alcohol, or use illegal drugs. Some items may interact with your medicine. What should I watch for while using this  medicine? Visit your doctor or health care professional for regular checks on your progress. Tell your doctor or healthcare professional if your symptoms do not start to get better or if they get worse. You will be tested for tuberculosis (TB) before you start this medicine. If your doctor prescribes any medicine for TB, you should start taking the TB medicine before starting this medicine. Make sure to finish the full course of TB medicine. Call your doctor or health care professional if you get a cold or other infection while receiving this medicine. Do not treat yourself. This medicine may decrease your body's ability to fight infection. Talk to your doctor about your risk of cancer. You may be more at risk for certain types of cancers if you take this medicine. What side effects may I notice from receiving this medicine? Side effects that you should report to your doctor or health care professional as soon as possible: -allergic reactions like skin rash, itching or hives, swelling of the face, lips, or tongue -breathing problems -changes in vision -chest pain -joint or muscle pain -mouth sores -numbness or tingling in any part of your body -red, scaly patches or raised bumps on the skin -signs and symptoms of infection like fever or chills; cough; sore throat; pain or trouble passing urine -signs and symptoms of liver injury like dark yellow or brown urine; general ill feeling or flu-like symptoms; light-colored stools; loss of appetite; nausea; right upper belly pain; unusually weak or tired; yellowing of the eyes or skin -swelling of the legs or ankles -swollen lymph nodes in the neck, underarm, or groin areas -unexplained weight loss -unusual bleeding or bruising -unusually weak or tired Side effects that usually do not require medical attention (report to your doctor or health care professional if they continue or are bothersome): -dizziness -increased blood  pressure -nausea -redness, itching, swelling, or bruising at site where injected -runny nose This list may not describe all possible side effects. Call your doctor for medical advice about side effects. You may report side effects to FDA at 1-800-FDA-1088. Where should I keep my medicine? Infusions will be given in a hospital or clinic and will not be stored at home. Storage for syringes given under the skin and stored at home: Keep out of the reach of children. Store in the original container in a refrigerator between 2 and 8 degrees C (36 and 46 degrees F). Keep this medicine in the original container. Protect from light. Do not freeze. Throw away any unused medicine after the expiration date. NOTE: This sheet is a summary. It may not cover all possible information. If you have questions about this medicine, talk to your doctor, pharmacist, or health care provider.  2018 Elsevier/Gold Standard (2016-08-30 08:42:06)

## 2017-02-28 NOTE — Progress Notes (Signed)
Patient in office today for initial start to Simponi. Patient tolerated injection well.  I gave injection to patient. Patient monitored in office for 30 minutes after administration for adverse reactions. No adverse reactions noted.   Administrations This Visit    Golimumab SOAJ 50 mg    Admin Date 02/28/2017 Action Given Dose 50 mg Route Subcutaneous Administered By Henriette Combs, LPN

## 2017-04-03 ENCOUNTER — Telehealth: Payer: Self-pay | Admitting: Rheumatology

## 2017-04-03 NOTE — Telephone Encounter (Signed)
Patient advised her prescription was sent to the [facility]. Patient provided with number.

## 2017-04-03 NOTE — Telephone Encounter (Signed)
Patient called wanting to know where her Simponi was called into.  She wasn't sure since she has not done this before.   CB#[phone].  Thank you.

## 2017-05-21 ENCOUNTER — Telehealth: Payer: Self-pay | Admitting: Pharmacist

## 2017-05-21 DIAGNOSIS — Z79899 Other long term (current) drug therapy: Secondary | ICD-10-CM

## 2017-05-21 NOTE — Telephone Encounter (Signed)
I attempted called patient to follow up on Simponi.  Patient started on this medication in May 2018.  Noted she has not had labs since starting the medication.  Patient is also due for follow up visit.  I left patient a message asking her to call me back.   Lilla Shook, Pharm.D., BCPS, CPP Clinical Pharmacist Pager: [phone] Phone: [phone] 05/21/2017 4:25 PM

## 2017-05-24 ENCOUNTER — Telehealth: Payer: Self-pay | Admitting: Pharmacist

## 2017-05-24 NOTE — Telephone Encounter (Signed)
Patient can have Simponi refills after she has labs drawn in the office.

## 2017-05-24 NOTE — Telephone Encounter (Signed)
I received a return call from patient.  She scheduled a visit for 07/31/17.  I advised patient that she needs labs as she has not had labs since starting Simponi in May 2018.  Patient reports she will try to come in for labs tomorrow.  Patient confirms she took Simponi in May and June, but missed July dose.  Patient is requesting a refill of Simponi.    Last visit: 01/10/17 Next visit: 07/31/17 Labs: 01/10/17 CBC normal, CMP normal  Okay to refill Simponi?

## 2017-05-24 NOTE — Telephone Encounter (Signed)
I made multiple attempts to reach patient to schedule visit.  I sent her a my chart message asking her to come get labs and schedule follow up visit.    Lilla Shook, Pharm.D., BCPS, CPP Clinical Pharmacist Pager: [phone] Phone: [phone] 05/24/2017 9:40 AM

## 2017-05-25 ENCOUNTER — Other Ambulatory Visit: Payer: Self-pay

## 2017-05-25 DIAGNOSIS — Z79899 Other long term (current) drug therapy: Secondary | ICD-10-CM

## 2017-05-25 LAB — CBC WITH DIFFERENTIAL/PLATELET
Basophils Absolute: 0 cells/uL (ref 0–200)
Basophils Relative: 0 %
EOS PCT: 2 %
Eosinophils Absolute: 98 cells/uL (ref 15–500)
HCT: 38.6 % (ref 35.0–45.0)
Hemoglobin: 12.8 g/dL (ref 11.7–15.5)
LYMPHS PCT: 40 %
Lymphs Abs: 1960 cells/uL (ref 850–3900)
MCH: 30 pg (ref 27.0–33.0)
MCHC: 33.2 g/dL (ref 32.0–36.0)
MCV: 90.6 fL (ref 80.0–100.0)
MONOS PCT: 8 %
MPV: 9.4 fL (ref 7.5–12.5)
Monocytes Absolute: 392 cells/uL (ref 200–950)
NEUTROS PCT: 50 %
Neutro Abs: 2450 cells/uL (ref 1500–7800)
PLATELETS: 248 10*3/uL (ref 140–400)
RBC: 4.26 MIL/uL (ref 3.80–5.10)
RDW: 13.1 % (ref 11.0–15.0)
WBC: 4.9 10*3/uL (ref 3.8–10.8)

## 2017-05-25 MED ORDER — GOLIMUMAB 50 MG/0.5ML ~~LOC~~ SOAJ: 50.0000 mg | SUBCUTANEOUS | 1 refills | Status: DC

## 2017-05-25 NOTE — Telephone Encounter (Signed)
Patient came for labs on 05/25/17.  Prescription sent to the pharmacy.

## 2017-05-26 LAB — COMPLETE METABOLIC PANEL WITH GFR
ALT: 7 U/L (ref 6–29)
AST: 13 U/L (ref 10–30)
Albumin: 4.2 g/dL (ref 3.6–5.1)
Alkaline Phosphatase: 74 U/L (ref 33–115)
BUN: 7 mg/dL (ref 7–25)
CHLORIDE: 107 mmol/L (ref 98–110)
CO2: 24 mmol/L (ref 20–31)
Calcium: 9 mg/dL (ref 8.6–10.2)
Creat: 0.64 mg/dL (ref 0.50–1.10)
GFR, Est African American: >89 mL/min (ref >=60)
GLUCOSE: 90 mg/dL (ref 65–99)
POTASSIUM: 4.3 mmol/L (ref 3.5–5.3)
SODIUM: 139 mmol/L (ref 135–146)
Total Bilirubin: 0.6 mg/dL (ref 0.2–1.2)
Total Protein: 6.5 g/dL (ref 6.1–8.1)

## 2017-05-27 NOTE — Progress Notes (Signed)
WNLs

## 2017-07-24 NOTE — Progress Notes (Deleted)
Office Visit Note  Patient: Dawn [name]             Date of Birth: 1980/12/12           MRN: [id number]             PCP: [facility] Referring: Pc, [facility]* Visit Date: 07/31/2017 Occupation: @GUAROCC @    Subjective:  No chief complaint on file.   History of Present Illness: Dawn [name] is a 37 y.o. female ***   Activities of Daily Living:  Patient reports morning stiffness for *** {minute/hour:19697}.   Patient {ACTIONS;DENIES/REPORTS:21021675::"Denies"} nocturnal pain.  Difficulty dressing/grooming: {ACTIONS;DENIES/REPORTS:21021675::"Denies"} Difficulty climbing stairs: {ACTIONS;DENIES/REPORTS:21021675::"Denies"} Difficulty getting out of chair: {ACTIONS;DENIES/REPORTS:21021675::"Denies"} Difficulty using hands for taps, buttons, cutlery, and/or writing: {ACTIONS;DENIES/REPORTS:21021675::"Denies"}   No Rheumatology ROS completed.   PMFS History:  Patient Active Problem List   Diagnosis Date Noted  . Sicca syndrome, unspecified (HCC) 02/21/2017  . High risk medication use 01/03/2017  . Rheumatoid arthritis with rheumatoid factor of multiple sites without organ or systems involvement (HCC) 06/26/2016  . Status post induction of labor 01/13/2015  . Term pregnancy 01/13/2015  . Spontaneous vaginal delivery 01/13/2015    Past Medical History:  Diagnosis Date  . Arthritis   . H/O laparoscopy 05/05/13  . History of ectopic pregnancy 05/05/13  . Hx of unilateral salpingectomy 05/05/13   Left  . Hx of wisdom tooth extraction   . Medical history non-contributory     No family history on file. Past Surgical History:  Procedure Laterality Date  . LAPAROSCOPY N/A 05/06/2013   Procedure: LAPAROSCOPY OPERATIVE, Left Salpingectomy, Removal Ectopic Pregnancy;  Surgeon: 05/08/2013, MD;  Location: [facility];  Service: Gynecology;  Laterality: N/A;  . WISDOM TOOTH EXTRACTION     Social History   Social History Narrative  . No narrative  on file     Objective: Vital Signs: There were no vitals taken for this visit.   Physical Exam   Musculoskeletal Exam: ***  CDAI Exam: No CDAI exam completed.    Investigation: Findings:  10/04/2016 negative TB gold   CBC Latest Ref Rng & Units 05/25/2017 01/10/2017 11/01/2016  WBC 3.8 - 10.8 K/uL 4.9 5.6 5.2  Hemoglobin 11.7 - 15.5 g/dL 12/30/2016 31.5 40.0  Hematocrit 35.0 - 45.0 % 38.6 39.2 37.6  Platelets 140 - 400 K/uL 248 254 236   CMP Latest Ref Rng & Units 05/25/2017 01/10/2017 11/01/2016  Glucose 65 - 99 mg/dL 90 86 12/30/2016)  BUN 7 - 25 mg/dL 7 13 11   Creatinine 0.50 - 1.10 mg/dL 619(J 0.93  Sodium 135 - 146 mmol/L 139 140 139  Potassium 3.5 - 5.3 mmol/L 4.3 4.5 4.0  Chloride 98 - 110 mmol/L 107 106 108  CO2 20 - 31 mmol/L 24 24 25   Calcium 8.6 - 10.2 mg/dL 9.0 9.3 2.67)  Total Protein 6.1 - 8.1 g/dL 6.5 6.9 6.5  Total Bilirubin 0.2 - 1.2 mg/dL 0.6 0.4 0.5  Alkaline Phos 33 - 115 U/L 74 73 67  AST 10 - 30 U/L 13 14 19   ALT 6 - 29 U/L 7 8 13(L)   Imaging: No results found.  Speciality Comments: No specialty comments available.    Procedures:  No procedures performed Allergies: Patient has no known allergies.   Assessment / Plan:     Visit Diagnoses: Rheumatoid arthritis with rheumatoid factor of multiple sites without organ or systems involvement (HCC)  High risk medication use - Simponi  Sub  Q / Methotrexate prescribed but patient not taking  Sicca syndrome, unspecified (HCC)  Non compliance w medication regimen(medications and labs)    Orders: No orders of the defined types were placed in this encounter.  No orders of the defined types were placed in this encounter.   Face-to-face time spent with patient was *** minutes. 50% of time was spent in counseling and coordination of care.  Follow-Up Instructions: No Follow-up on file.   Amy Littrell, RT  Note - This record has been created using [employer].  Chart creation errors have been  sought, but may not always  have been located. Such creation errors do not reflect on  the standard of medical care.

## 2017-07-31 ENCOUNTER — Ambulatory Visit: Payer: BLUE CROSS/BLUE SHIELD | Admitting: Rheumatology

## 2017-08-03 ENCOUNTER — Other Ambulatory Visit: Payer: Self-pay | Admitting: Rheumatology

## 2017-08-03 NOTE — Telephone Encounter (Signed)
Last Visit: 01/10/17 Next Visit due July 2018. Message sent to the front to schedule patient Labs: 05/25/17 WNL TB Gold: 10/04/16 Neg   Okay to refill per Dr. Corliss Skains

## 2017-08-23 ENCOUNTER — Telehealth: Payer: Self-pay | Admitting: Rheumatology

## 2017-08-23 NOTE — Telephone Encounter (Signed)
I LMOVM for patient to call, and schedule rov. °

## 2017-08-23 NOTE — Telephone Encounter (Signed)
-----   Message from Henriette Combs, LPN sent at 57/32/2025  4:17 PM EDT ----- Regarding: Please schedule patient a follow up visit.  Please schedule patient a follow up visit. Patient was due July 2018. Thanks!

## 2017-08-27 NOTE — Progress Notes (Signed)
Office Visit Note  Patient: Dawn [name]             Date of Birth: [birth year]           MRN: [id number]             PCP: [facility] Referring: [facility], [facility]* Visit Date: 08/28/2017 Occupation: @GUAROCC @    Subjective:  Arthritis (has been flaring often )   History of Present Illness: Dawn [name] is a 36 y.o. female with history of some sero positive rheumatoid arthritis. She started Simponi subcutaneous on 02/28/2017. She states it initially worked for her and now it's not been working for the last few months. She's been having increased pain and discomfort.she denies any joint swelling. She has failed Humira in the past. She was doing really well on IV Orencia but had to discontinue it due to insurance coverage. She has tried methotrexate 3 times in the past but discontinued due to headaches.she's been having pain in the sternoclavicular joints, elbow joints, wrist joints,and hip joints.  Activities of Daily Living:  Patient reports morning stiffness for all day hours.   Patient Reports nocturnal pain.  Difficulty dressing/grooming: Denies Difficulty climbing stairs: Reports Difficulty getting out of chair: Reports Difficulty using hands for taps, buttons, cutlery, and/or writing: Denies   Review of Systems  Constitutional: Negative for fatigue, night sweats, weight gain, weight loss and weakness.  HENT: Negative for mouth sores, trouble swallowing, trouble swallowing, mouth dryness and nose dryness.   Eyes: Negative for pain, redness, visual disturbance and dryness.  Respiratory: Negative for cough, shortness of breath and difficulty breathing.   Cardiovascular: Negative for chest pain, palpitations, hypertension, irregular heartbeat and swelling in legs/feet.  Gastrointestinal: Positive for constipation. Negative for blood in stool and diarrhea.  Endocrine: Negative for increased urination.  Genitourinary: Negative for vaginal dryness.    Musculoskeletal: Positive for arthralgias, joint pain and morning stiffness. Negative for joint swelling, myalgias, muscle weakness, muscle tenderness and myalgias.  Skin: Negative for color change, rash, hair loss, skin tightness, ulcers and sensitivity to sunlight.  Allergic/Immunologic: Negative for susceptible to infections.  Neurological: Negative for dizziness, memory loss and night sweats.  Hematological: Negative for swollen glands.  Psychiatric/Behavioral: Negative for depressed mood and sleep disturbance. The patient is not nervous/anxious.     PMFS History:  Patient Active Problem List   Diagnosis Date Noted  . Sicca syndrome, unspecified (HCC) 02/21/2017  . High risk medication use 01/03/2017  . Rheumatoid arthritis with rheumatoid factor of multiple sites without organ or systems involvement (HCC) 06/26/2016  . Status post induction of labor 01/13/2015  . Term pregnancy 01/13/2015  . Spontaneous vaginal delivery 01/13/2015    Past Medical History:  Diagnosis Date  . Arthritis   . H/O laparoscopy 05/05/13  . History of ectopic pregnancy 05/05/13  . Hx of unilateral salpingectomy 05/05/13   Left  . Hx of wisdom tooth extraction   . Medical history non-contributory     Family History  Problem Relation Age of Onset  . Psoriasis Sister    Past Surgical History:  Procedure Laterality Date  . WISDOM TOOTH EXTRACTION     Social History   Social History Narrative  . Not on file     Objective: Vital Signs: BP 99/65 (BP Location: Left Arm, Patient Position: Sitting, Cuff Size: Normal)   Pulse 77   Resp 16   Ht 5\' 6"  (1.676 m)   Wt 138 lb (62.6 kg)  LMP 08/21/2017   BMI 22.27 kg/m    Physical Exam  Constitutional: She is oriented to person, place, and time. She appears well-developed and well-nourished.  HENT:  Head: Normocephalic and atraumatic.  Eyes: Conjunctivae and EOM are normal.  Neck: Normal range of motion.  Cardiovascular: Normal rate, regular  rhythm, normal heart sounds and intact distal pulses.  Pulmonary/Chest: Effort normal and breath sounds normal.  Abdominal: Soft. Bowel sounds are normal.  Lymphadenopathy:    She has no cervical adenopathy.  Neurological: She is alert and oriented to person, place, and time.  Skin: Skin is warm and dry. Capillary refill takes less than 2 seconds.  Psychiatric: She has a normal mood and affect. Her behavior is normal.  Nursing note and vitals reviewed.    Musculoskeletal Exam: C-spine and thoracic lumbar spine good range of motion. She had synovitis over right sternoclavicular joint. She has tenderness over left sternoclavicular joint. She had tenderness over bilateral elbow joints without any synovitis. She has discomfort range of motion of her shoulder joints. There was no synovitis over MCPs PIPs DIPs her wrist joints. She had tenderness over bilateral trochanteric bursa area. Knee joints ankle joints MTPs had no synovitis.  CDAI Exam: CDAI Homunculus Exam:   Tenderness:  Right sternoclavicular RUE: ulnohumeral and radiohumeral and wrist LUE: ulnohumeral and radiohumeral and wrist  Swelling:  Right sternoclavicular  Joint Counts:  CDAI Tender Joint count: 4 CDAI Swollen Joint count: 0  Global Assessments:  Patient Global Assessment: 4 Provider Global Assessment: 4  CDAI Calculated Score: 12   Investigation: No additional findings. TB Gold: 10/04/2016 Negative  CBC Latest Ref Rng & Units 05/25/2017 01/10/2017 11/01/2016  WBC 3.8 - 10.8 K/uL 4.9 5.6 5.2  Hemoglobin 11.7 - 15.5 g/dL 21.3 08.6 57.8  Hematocrit 35.0 - 45.0 % 38.6 39.2 37.6  Platelets 140 - 400 K/uL 248 254 236   CMP Latest Ref Rng & Units 05/25/2017 01/10/2017 11/01/2016  Glucose 65 - 99 mg/dL 90 86 469(G)  BUN 7 - 25 mg/dL 7 13 11   Creatinine 0.50 - 1.10 mg/dL 2.95 2.84 1.32  Sodium 135 - 146 mmol/L 139 140 139  Potassium 3.5 - 5.3 mmol/L 4.3 4.5 4.0  Chloride 98 - 110 mmol/L 107 106 108  CO2 20 - 31 mmol/L  24 24 25   Calcium 8.6 - 10.2 mg/dL 9.0 9.3 4.4(W)  Total Protein 6.1 - 8.1 g/dL 6.5 6.9 6.5  Total Bilirubin 0.2 - 1.2 mg/dL 0.6 0.4 0.5  Alkaline Phos 33 - 115 U/L 74 73 67  AST 10 - 30 U/L 13 14 19   ALT 6 - 29 U/L 7 8 13(L)    Imaging: No results found.  Speciality Comments: TB Gold: 10/04/2016 Negative     Procedures:  No procedures performed Allergies: Patient has no known allergies.   Assessment / Plan:     Visit Diagnoses: Rheumatoid arthritis with rheumatoid factor of multiple sites without organ or systems involvement (HCC) - Positive RF, positive anti-CCP, erosive disease. Patient continues to have active synovitis. She's been having pain and discomfort in multiple joints. She has synovitis in her right sternoclavicular joint and tenderness on left sternoclavicular joint. She has discomfort in her bilateral shoulders bilateral elbows. She is generally well on IV Orencia in the past. She would like to try subcutaneous Orencia. She has failed Simponi and Humira. I will apply for subcutaneous  Orencia. Indications side effects contraindications were discussed. Handout was given. And consent was taken. We will apply for  Orencia subcutaneous. Once approved she can come in the office for her first injection.  Medication counseling:   Does patient have a diagnosis of COPD? No  Counseled patient that Dub Amis is a selective T-cell costimulation blocker indicated for rheumatoid arthritis.  Counseled patient on purpose, proper use, and adverse effects of Orencia. The most common adverse effects are increased risk of infections, headache, and injection site reactions.  There is the possibility of an increased risk of malignancy but it is not well understood if this increased risk is due to the medication or the disease state.  Reviewed the importance of regular labs while on Orencia therapy.  Counseled patient that Dub Amis should be held prior to scheduled surgery.  Counseled patient to avoid  live vaccines while on Orencia.  Advised patient to get annual influenza vaccine and the pneumococcal vaccine as indicated.  Provided patient with medication education material and answered all questions.  Patient consented to [place].  Will upload consent into patient's chart.  Will apply for Orencia through patient's insurance.  Reviewed storage information for Orencia.  Advised initial injection must be administered in office.    High risk medication use - Simponi SQ q month (02/28/2017) (methotrexate-headaches, inadequate response to Humira and Orencia) - Plan: CBC with Differential/Platelet, COMPLETE METABOLIC PANEL WITH GFR, Quantiferon tb gold assay (blood)today and then every 3 months.  Sicca syndrome, unspecified (HCC): The symptoms are better  Chronic pain of both shoulders  Pain of right sternoclavicular joint: She has synovitis on palpation  Trochanteric bursitis of both hips: She is tenderness on palpation over bilateral trochanteric bursa. A handout on IT band exercises was given.  Pain of both elbows : She is tenderness without any active synovitis.   Orders: Orders Placed This Encounter  Procedures  . CBC with Differential/Platelet  . COMPLETE METABOLIC PANEL WITH GFR  . Quantiferon tb gold assay (blood)   No orders of the defined types were placed in this encounter.   Face-to-face time spent with patient was 30 minutes.greater than 50% of time was spent in counseling and coordination of care.  Follow-Up Instructions: Return in about 3 months (around 11/28/2017) for Rheumatoid arthritis.   Pollyann Savoy, MD  Note - This record has been created using [employer].  Chart creation errors have been sought, but may not always  have been located. Such creation errors do not reflect on  the standard of medical care.

## 2017-08-28 ENCOUNTER — Ambulatory Visit: Payer: BLUE CROSS/BLUE SHIELD | Admitting: Rheumatology

## 2017-08-28 ENCOUNTER — Encounter: Payer: Self-pay | Admitting: Rheumatology

## 2017-08-28 VITALS — BP 99/65 | HR 77 | Resp 16 | Ht 66.0 in | Wt 138.0 lb

## 2017-08-28 DIAGNOSIS — Z79899 Other long term (current) drug therapy: Secondary | ICD-10-CM

## 2017-08-28 DIAGNOSIS — M3500 Sicca syndrome, unspecified: Secondary | ICD-10-CM | POA: Diagnosis not present

## 2017-08-28 DIAGNOSIS — M7061 Trochanteric bursitis, right hip: Secondary | ICD-10-CM | POA: Diagnosis not present

## 2017-08-28 DIAGNOSIS — M0579 Rheumatoid arthritis with rheumatoid factor of multiple sites without organ or systems involvement: Secondary | ICD-10-CM

## 2017-08-28 DIAGNOSIS — M7062 Trochanteric bursitis, left hip: Secondary | ICD-10-CM

## 2017-08-28 DIAGNOSIS — M25512 Pain in left shoulder: Secondary | ICD-10-CM

## 2017-08-28 DIAGNOSIS — G8929 Other chronic pain: Secondary | ICD-10-CM | POA: Diagnosis not present

## 2017-08-28 DIAGNOSIS — M25521 Pain in right elbow: Secondary | ICD-10-CM

## 2017-08-28 DIAGNOSIS — M25522 Pain in left elbow: Secondary | ICD-10-CM

## 2017-08-28 DIAGNOSIS — M25511 Pain in right shoulder: Secondary | ICD-10-CM | POA: Diagnosis not present

## 2017-08-28 NOTE — Patient Instructions (Addendum)
Abatacept solution for injection (subcutaneous or intravenous use) What is this medicine? ABATACEPT (a ba TA sept) is used to treat moderate to severe active rheumatoid arthritis or psoriatic arthritis in adults. This medicine is also used to treat juvenile idiopathic arthritis. This medicine may be used for other purposes; ask your health care provider or pharmacist if you have questions. COMMON BRAND NAME(S): Orencia What should I tell my health care provider before I take this medicine? They need to know if you have any of these conditions: -are taking other medicines to treat rheumatoid arthritis -COPD -diabetes -infection or history of infections -recently received or scheduled to receive a vaccine -scheduled to have surgery -tuberculosis, a positive skin test for tuberculosis or have recently been in close contact with someone who has tuberculosis -viral hepatitis -an unusual or allergic reaction to abatacept, other medicines, foods, dyes, or preservatives -pregnant or trying to get pregnant -breast-feeding How should I use this medicine? This medicine is for infusion into a vein or for injection under the skin. Infusions are given by a health care professional in a hospital or clinic setting. If you are to give your own medicine at home, you will be taught how to prepare and give this medicine under the skin. Use exactly as directed. Take your medicine at regular intervals. Do not take your medicine more often than directed. It is important that you put your used needles and syringes in a special sharps container. Do not put them in a trash can. If you do not have a sharps container, call your pharmacist or healthcare provider to get one. Talk to your pediatrician regarding the use of this medicine in children. While infusions in a clinic may be prescribed for children as young as 2 years for selected conditions, precautions do apply. Overdosage: If you think you have taken too much of  this medicine contact a poison control center or emergency room at once. NOTE: This medicine is only for you. Do not share this medicine with others. What if I miss a dose? This medicine is used once a week if given by injection under the skin. If you miss a dose, take it as soon as you can. If it is almost time for your next dose, take only that dose. Do not take double or extra doses. If you are to be given an infusion, it is important not to miss your dose. Doses are usually every 4 weeks. Call your doctor or health care professional if you are unable to keep an appointment. What may interact with this medicine? Do not take this medicine with any of the following medications: -adalimumab -anakinra -certolizumab -etanercept -golimumab -infliximab -live virus vaccines -rituximab -tocilizumab This medicine may also interact with the following medications: -vaccines This list may not describe all possible interactions. Give your health care provider a list of all the medicines, herbs, non-prescription drugs, or dietary supplements you use. Also tell them if you smoke, drink alcohol, or use illegal drugs. Some items may interact with your medicine. What should I watch for while using this medicine? Visit your doctor for regular check ups while you are taking this medicine. Tell your doctor or healthcare professional if your symptoms do not start to get better or if they get worse. Call your doctor or health care professional if you get a cold or other infection while receiving this medicine. Do not treat yourself. This medicine may decrease your body's ability to fight infection. Try to avoid being around people who   are sick. What side effects may I notice from receiving this medicine? Side effects that you should report to your doctor or health care professional as soon as possible: -allergic reactions like skin rash, itching or hives, swelling of the face, lips, or tongue -breathing  problems -chest pain -signs of infection - fever or chills, cough, unusual tiredness, pain or trouble passing urine, or warm, red or painful skin Side effects that usually do not require medical attention (report to your doctor or health care professional if they continue or are bothersome): -dizziness -headache -nausea, vomiting -sore throat -stomach upset This list may not describe all possible side effects. Call your doctor for medical advice about side effects. You may report side effects to FDA at 1-800-FDA-1088. Where should I keep my medicine? Infusions will be given in a hospital or clinic and will not be stored at home. Storage for syringes given under the skin and stored at home: Keep out of the reach of children. Store in a refrigerator between 2 and 8 degrees C (36 and 46 degrees F). Keep this medicine in the original container. Protect from light. Do not freeze. Throw away any unused medicine after the expiration date. NOTE: This sheet is a summary. It may not cover all possible information. If you have questions about this medicine, talk to your doctor, pharmacist, or health care provider.  2018 Elsevier/Gold Standard (2016-04-27 10:07:35) Standing Labs We placed an order today for your standing lab work.    Please come back and get your standing labs in February and every 3 months Iliotibial Band Syndrome Rehab Ask your health care provider which exercises are safe for you. Do exercises exactly as told by your health care provider and adjust them as directed. It is normal to feel mild stretching, pulling, tightness, or discomfort as you do these exercises, but you should stop right away if you feel sudden pain or your pain gets worse.Do not begin these exercises until told by your health care provider. Stretching and range of motion exercises These exercises warm up your muscles and joints and improve the movement and flexibility of your hip and pelvis. Exercise A:  Quadriceps, prone  1. Lie on your abdomen on a firm surface, such as a bed or padded floor. 2. Bend your left / right knee and hold your ankle. If you cannot reach your ankle or pant leg, loop a belt around your foot and grab the belt instead. 3. Gently pull your heel toward your buttocks. Your knee should not slide out to the side. You should feel a stretch in the front of your thigh and knee. 4. Hold this position for __________ seconds. Repeat __________ times. Complete this stretch __________ times a day. Exercise B: Iliotibial band  1. Lie on your side with your left / right leg in the top position. 2. Bend both of your knees and grab your left / right ankle. Stretch out your bottom arm to help you balance. 3. Slowly bring your top knee back so your thigh goes behind your trunk. 4. Slowly lower your top leg toward the floor until you feel a gentle stretch on the outside of your left / right hip and thigh. If you do not feel a stretch and your knee will not fall farther, place the heel of your other foot on top of your knee and pull your knee down toward the floor with your foot. 5. Hold this position for __________ seconds. Repeat __________ times. Complete this stretch __________  times a day. Strengthening exercises These exercises build strength and endurance in your hip and pelvis. Endurance is the ability to use your muscles for a long time, even after they get tired. Exercise C: Straight leg raises ( hip abductors) 1. Lie on your side with your left / right leg in the top position. Lie so your head, shoulder, knee, and hip line up. You may bend your bottom knee to help you balance. 2. Roll your hips slightly forward so your hips are stacked directly over each other and your left / right knee is facing forward. 3. Tense the muscles in your outer thigh and lift your top leg 4-6 inches (10-15 cm). 4. Hold this position for __________ seconds. 5. Slowly return to the starting position.  Let your muscles relax completely before doing another repetition. Repeat __________ times. Complete this exercise __________ times a day. Exercise D: Straight leg raises ( hip extensors) 1. Lie on your abdomen on your bed or a firm surface. You can put a pillow under your hips if that is more comfortable. 2. Bend your left / right knee so your foot is straight up in the air. 3. Squeeze your buttock muscles and lift your left / right thigh off the bed. Do not let your back arch. 4. Tense this muscle as hard as you can without increasing any knee pain. 5. Hold this position for __________ seconds. 6. Slowly lower your leg to the starting position and allow it to relax completely. Repeat __________ times. Complete this exercise __________ times a day. Exercise E: Hip hike 1. Stand sideways on a bottom step. Stand on your left / right leg with your other foot unsupported next to the step. You can hold onto the railing or wall if needed for balance. 2. Keep your knees straight and your torso square. Then, lift your left / right hip up toward the ceiling. 3. Slowly let your left / right hip lower toward the floor, past the starting position. Your foot should get closer to the floor. Do not lean or bend your knees. Repeat __________ times. Complete this exercise __________ times a day. This information is not intended to replace advice given to you by your health care provider. Make sure you discuss any questions you have with your health care provider. Document Released: 10/09/2005 Document Revised: 06/13/2016 Document Reviewed: 09/10/2015 Elsevier Interactive Patient Education  [employer].   We have open lab Monday through Friday from 8:30-11:30 AM and 1:30-4 PM at the office of Dr. Pollyann Savoy.   The office is located at [address], [place], [place] [address] No appointment is necessary.   Labs are drawn by [facility].  You may receive a bill from [facility] for your lab  work. If you have any questions regarding directions or hours of operation,  please call [phone].

## 2017-08-28 NOTE — Progress Notes (Signed)
Counseled patient that Dawn [name] is a selective T-cell costimulation blocker.  Counseled patient on purpose, proper use, and adverse effects of Orencia. The most common adverse effects are increased risk of infections, headache, and injection site reactions or infusion reactions.  There is the possibility of an increased risk of malignancy but it is not well understood if this increased risk is due to the medication or the disease state.  Reviewed the importance of regular labs while on Orencia.  Counseled patient that Dawn [name] should be held prior to scheduled surgery.  Counseled patient to avoid live vaccines while on Orencia.  Advised patient to get annual influenza vaccine and the pneumococcal vaccine as indicated.  Provided patient with medication education material and answered all questions.  Patient consented to [facility].  Will upload consent into patient's chart.  Will apply for Orencia through patient's insurance.  Reviewed storage information for Orencia.  Advised initial injection must be administered in office.

## 2017-08-29 NOTE — Progress Notes (Signed)
Within normal limits

## 2017-08-30 LAB — QUANTIFERON TB GOLD ASSAY (BLOOD)
Mitogen-Nil: >10 [IU]/mL
QUANTIFERON(R)-TB GOLD: NEGATIVE
Quantiferon Nil Value: 0.05 [IU]/mL
Quantiferon Tb Ag Minus Nil Value: 0.01 [IU]/mL

## 2017-08-30 LAB — CBC WITH DIFFERENTIAL/PLATELET
BASOS ABS: 33 {cells}/uL (ref 0–200)
Basophils Relative: 0.5 %
EOS PCT: 1.1 %
Eosinophils Absolute: 73 cells/uL (ref 15–500)
HEMATOCRIT: 37 % (ref 35.0–45.0)
Hemoglobin: 12.5 g/dL (ref 11.7–15.5)
LYMPHS ABS: 2455 {cells}/uL (ref 850–3900)
MCH: 29.7 pg (ref 27.0–33.0)
MCHC: 33.8 g/dL (ref 32.0–36.0)
MCV: 87.9 fL (ref 80.0–100.0)
MONOS PCT: 5.3 %
MPV: 9.7 fL (ref 7.5–12.5)
NEUTROS PCT: 55.9 %
Neutro Abs: 3689 cells/uL (ref 1500–7800)
Platelets: 279 10*3/uL (ref 140–400)
RBC: 4.21 10*6/uL (ref 3.80–5.10)
RDW: 11.9 % (ref 11.0–15.0)
Total Lymphocyte: 37.2 %
WBC mixed population: 350 cells/uL (ref 200–950)
WBC: 6.6 10*3/uL (ref 3.8–10.8)

## 2017-08-30 LAB — COMPLETE METABOLIC PANEL WITHOUT GFR
AG Ratio: 1.6 (calc) (ref 1.0–2.5)
ALT: 9 U/L (ref 6–29)
AST: 14 U/L (ref 10–30)
Albumin: 4.5 g/dL (ref 3.6–5.1)
Alkaline phosphatase (APISO): 86 U/L (ref 33–115)
BUN: 10 mg/dL (ref 7–25)
CO2: 28 mmol/L (ref 20–32)
Calcium: 9.3 mg/dL (ref 8.6–10.2)
Chloride: 105 mmol/L (ref 98–110)
Creat: 0.61 mg/dL (ref 0.50–1.10)
GFR, Est African American: 134 mL/min/1.73m2
GFR, Est Non African American: 116 mL/min/1.73m2
Globulin: 2.9 g/dL (ref 1.9–3.7)
Glucose, Bld: 84 mg/dL (ref 65–99)
Potassium: 3.8 mmol/L (ref 3.5–5.3)
Sodium: 140 mmol/L (ref 135–146)
Total Bilirubin: 0.5 mg/dL (ref 0.2–1.2)
Total Protein: 7.4 g/dL (ref 6.1–8.1)

## 2017-09-03 ENCOUNTER — Telehealth: Payer: Self-pay

## 2017-09-03 MED ORDER — ABATACEPT 125 MG/ML ~~LOC~~ SOAJ: 125.0000 mg | SUBCUTANEOUS | 0 refills | Status: DC

## 2017-09-03 NOTE — Telephone Encounter (Signed)
Prescription has been sent to the pharmacy. Will schedule patient for a nurse visit once medication is received in the office.

## 2017-09-03 NOTE — Telephone Encounter (Signed)
Submitted a prior authorization for Orencia Clickjet to patients insurance over the phone. Spoke with Herbert Seta who processed the claim. Orencia Clickjet has been approved from 09/03/17 through 09/03/2018. An approval letter will be faxed to the clinic within 24 hours.   Authorization number: [id number] Phone number: [phone]  Will send document to scan center once received.  Called the patient to update. Gave her the phone number to activate the copay card [phone]). Once we receive the medication from the pharmacy, we will contact pt to schedule a nursing visit. Patient voices understanding and denies any questions at this time.   Please send Rx for Orencia ClickJet to [facility]. Patient was previously on Simponi. Will need to schedule nursing visit for first administration. Thanks!  Dionis Autry, [place], CPhT 10:37 AM

## 2017-09-04 ENCOUNTER — Telehealth (INDEPENDENT_AMBULATORY_CARE_PROVIDER_SITE_OTHER): Payer: Self-pay

## 2017-09-04 NOTE — Telephone Encounter (Signed)
Patient called stating that she was having pain her left forearm radiating into her elbow.  Would like a call back.  CB# [phone].  Please advise.  Thank you.

## 2017-09-05 NOTE — Telephone Encounter (Signed)
Patient is experiencing pain in her left elbow radiating down her arm. The pain started yesterday and Patient states she is now experiencing pain in her collar bone as well. The collar bone pain has been off and on for a few days. Patient states she is experiencing hip pain and was told at her last office visit that it was bursitis. Patient would like to know recommendations for the elbow/arm and collar bone pain as well as the bursitis? Patient is also asking if you'd like her to see another doctor in regards to the bursitis in her hips?

## 2017-09-05 NOTE — Telephone Encounter (Signed)
I called patient with there was no response. I left message on her answering machine to call us back. She had synovitis in her right sternoclavicular joint. If she prefers taking give her a prednisone taper starting at 20 mg and tapering by 5 mg every 4 days. We have applied for Orencia. Once approved she'll be switching to Orencia. For the bilateral trochanteric bursitis we can refer her to physical therapy.

## 2017-09-17 ENCOUNTER — Telehealth (INDEPENDENT_AMBULATORY_CARE_PROVIDER_SITE_OTHER): Payer: Self-pay

## 2017-09-17 ENCOUNTER — Telehealth: Payer: Self-pay

## 2017-09-17 NOTE — Telephone Encounter (Signed)
Coordinated delivery of Orencia Clickjet from [facility]. Medication has been stored in refrigerator. Please make nursing visit. Thanks!  Cesar Rogerson, [place], CPhT 4:02 PM

## 2017-09-17 NOTE — Telephone Encounter (Signed)
Patient has an appointment scheduled for 09/20/17 at 10 am.

## 2017-09-17 NOTE — Telephone Encounter (Signed)
Patient called for nurse visit appt.for injection

## 2017-09-20 ENCOUNTER — Ambulatory Visit (INDEPENDENT_AMBULATORY_CARE_PROVIDER_SITE_OTHER): Payer: BLUE CROSS/BLUE SHIELD | Admitting: *Deleted

## 2017-09-20 VITALS — BP 89/66 | HR 77

## 2017-09-20 DIAGNOSIS — M0579 Rheumatoid arthritis with rheumatoid factor of multiple sites without organ or systems involvement: Secondary | ICD-10-CM

## 2017-09-20 MED ORDER — ABATACEPT 125 MG/ML ~~LOC~~ SOAJ
125.0000 mg | Freq: Once | SUBCUTANEOUS | Status: AC
  Administered 2017-09-20: 125 mg via SUBCUTANEOUS

## 2017-09-20 NOTE — Progress Notes (Signed)
Patient in office today for a a new start to Orencia. Patient provided medication. Patient given injection in left lower abdomen and tolerated injection well. Patient was monitored in office for 30 minutes after injection was administered for adverse reactions. No adverse reactions noted.   Administrations This Visit    Abatacept SOAJ 125 mg    Admin Date 09/20/2017 Action Given Dose 125 mg Route Subcutaneous Administered By Henriette Combs, LPN

## 2017-09-20 NOTE — Progress Notes (Signed)
Medication counseling:  TB Gold: 08/28/17 Hepatitis panel: 09/23/15 HIV: 09/23/15 SPEP: 09/23/15 Immunoglobulins: 09/23/15  Does patient have a diagnosis of COPD? No  Counseled patient that Dub Amis is a selective T-cell costimulation blocker indicated for Rheumatoid Arthritis.  Counseled patient on purpose, proper use, and adverse effects of Orencia. The most common adverse effects are increased risk of infections, headache, and injection site reactions.  There is the possibility of an increased risk of malignancy but it is not well understood if this increased risk is due to the medication or the disease state.  Reviewed the importance of regular labs while on Orencia therapy.  Counseled patient that Dub Amis should be held prior to scheduled surgery.  Counseled patient to avoid live vaccines while on Orencia.  Advised patient to get annual influenza vaccine (patient verified receiving flu vaccine 09/05/17) and the pneumococcal vaccine as indicated (patient verified pneumococcal vaccine in 2017).  Provided patient with medication education material and answered all questions.     Advised patient on how to administer injections using Click Jet pen. Advised patient that she will be due for labs in 1 month, and then every 3 months.   Essie Hart, PharmD

## 2017-10-25 ENCOUNTER — Other Ambulatory Visit: Payer: Self-pay

## 2017-10-25 DIAGNOSIS — Z79899 Other long term (current) drug therapy: Secondary | ICD-10-CM

## 2017-10-26 LAB — COMPLETE METABOLIC PANEL WITH GFR
AG Ratio: 1.6 (calc) (ref 1.0–2.5)
ALBUMIN MSPROF: 4.2 g/dL (ref 3.6–5.1)
ALKALINE PHOSPHATASE (APISO): 96 U/L (ref 33–115)
ALT: 7 U/L (ref 6–29)
AST: 12 U/L (ref 10–30)
BILIRUBIN TOTAL: 0.5 mg/dL (ref 0.2–1.2)
BUN: 9 mg/dL (ref 7–25)
CHLORIDE: 104 mmol/L (ref 98–110)
CO2: 28 mmol/L (ref 20–32)
Calcium: 8.9 mg/dL (ref 8.6–10.2)
Creat: 0.59 mg/dL (ref 0.50–1.10)
GFR, Est African American: 136 mL/min/{1.73_m2} (ref >=60)
GFR, Est Non African American: 117 mL/min/{1.73_m2} (ref >=60)
GLUCOSE: 80 mg/dL (ref 65–99)
Globulin: 2.6 g/dL (calc) (ref 1.9–3.7)
POTASSIUM: 3.9 mmol/L (ref 3.5–5.3)
Sodium: 139 mmol/L (ref 135–146)
Total Protein: 6.8 g/dL (ref 6.1–8.1)

## 2017-10-26 LAB — CBC WITH DIFFERENTIAL/PLATELET
BASOS PCT: 0.6 %
Basophils Absolute: 52 cells/uL (ref 0–200)
Eosinophils Absolute: 26 cells/uL (ref 15–500)
Eosinophils Relative: 0.3 %
HEMATOCRIT: 36 % (ref 35.0–45.0)
Hemoglobin: 12.4 g/dL (ref 11.7–15.5)
LYMPHS ABS: 2314 {cells}/uL (ref 850–3900)
MCH: 29.5 pg (ref 27.0–33.0)
MCHC: 34.4 g/dL (ref 32.0–36.0)
MCV: 85.5 fL (ref 80.0–100.0)
MPV: 9.8 fL (ref 7.5–12.5)
Monocytes Relative: 5.7 %
Neutro Abs: 5812 cells/uL (ref 1500–7800)
Neutrophils Relative %: 66.8 %
PLATELETS: 286 10*3/uL (ref 140–400)
RBC: 4.21 10*6/uL (ref 3.80–5.10)
RDW: 12 % (ref 11.0–15.0)
Total Lymphocyte: 26.6 %
WBC mixed population: 496 cells/uL (ref 200–950)
WBC: 8.7 10*3/uL (ref 3.8–10.8)

## 2017-10-29 NOTE — Progress Notes (Signed)
WNLs

## 2017-10-30 ENCOUNTER — Telehealth: Payer: Self-pay | Admitting: Rheumatology

## 2017-10-30 NOTE — Telephone Encounter (Signed)
Patient states she is having swelling in her right wrist and bilateral feet. Patient states she is also having swelling in her knuckles. Patient states she is also having "hot spots". Patient states she feels no different after she takes her injection. Patient states Dr. Corliss Skains discussed wanting to put her on Arava with the Orencia. Patient has been scheduled for an appointment to discuss this on 11/06/17.

## 2017-10-30 NOTE — Telephone Encounter (Signed)
Patient called stating that the Orencia does not seem to be working.  She wanted to see if there was something else she could try.  CB# [phone]

## 2017-11-05 NOTE — Progress Notes (Signed)
Office Visit Note  Patient: Dawn [name]             Date of Birth: [birth year]           MRN: [id number]             PCP: [facility] Referring: [facility], [facility] Visit Date: 11/06/2017 Occupation: @GUAROCC @    Subjective:  Other (discuss meds )   History of Present Illness: Dawn [name] is a 37 y.o. female with history of sero positive rheumatoid arthritis. She states she's not doing well on subcutaneous Orencia. She was doing much better while she was on IV Orencia. She could not tolerate methotrexate in the past due to headaches. She has failed MRI and Simponi in the past. She has pain and swelling in her hands and wrist currently. Her left shoulder joint is painful. Her feet hurts off and on.  Activities of Daily Living:  Patient reports morning stiffness for all day hours.   Patient Reports nocturnal pain.  Difficulty dressing/grooming: Denies Difficulty climbing stairs: Denies Difficulty getting out of chair: Reports Difficulty using hands for taps, buttons, cutlery, and/or writing: Reports   Review of Systems  Constitutional: Positive for fatigue. Negative for night sweats, weight gain, weight loss and weakness.  HENT: Negative for mouth sores, trouble swallowing, trouble swallowing, mouth dryness and nose dryness.   Eyes: Negative for pain, redness, visual disturbance and dryness.  Respiratory: Negative for cough, shortness of breath and difficulty breathing.   Cardiovascular: Negative for chest pain, palpitations, hypertension, irregular heartbeat and swelling in legs/feet.  Gastrointestinal: Negative for blood in stool, constipation and diarrhea.  Endocrine: Negative for increased urination.  Genitourinary: Negative for vaginal dryness.  Musculoskeletal: Positive for arthralgias, joint pain, joint swelling and morning stiffness. Negative for myalgias, muscle weakness, muscle tenderness and myalgias.  Skin: Negative for color change,  rash, hair loss, skin tightness, ulcers and sensitivity to sunlight.  Allergic/Immunologic: Negative for susceptible to infections.  Neurological: Negative for dizziness, memory loss and night sweats.  Hematological: Negative for swollen glands.  Psychiatric/Behavioral: Positive for sleep disturbance. Negative for depressed mood. The patient is not nervous/anxious.        Nocturnal pain    PMFS History:  Patient Active Problem List   Diagnosis Date Noted  . Sicca syndrome, unspecified (HCC) 02/21/2017  . High risk medication use 01/03/2017  . Rheumatoid arthritis with rheumatoid factor of multiple sites without organ or systems involvement (HCC) 06/26/2016  . Status post induction of labor 01/13/2015  . Term pregnancy 01/13/2015  . Spontaneous vaginal delivery 01/13/2015    Past Medical History:  Diagnosis Date  . Arthritis   . H/O laparoscopy 05/05/13  . History of ectopic pregnancy 05/05/13  . Hx of unilateral salpingectomy 05/05/13   Left  . Hx of wisdom tooth extraction   . Medical history non-contributory     Family History  Problem Relation Age of Onset  . Psoriasis Sister    Past Surgical History:  Procedure Laterality Date  . LAPAROSCOPY N/A 05/06/2013   Procedure: LAPAROSCOPY OPERATIVE, Left Salpingectomy, Removal Ectopic Pregnancy;  Surgeon: 05/08/2013, MD;  Location: [facility];  Service: Gynecology;  Laterality: N/A;  . WISDOM TOOTH EXTRACTION     Social History   Social History Narrative  . Not on file     Objective: Vital Signs: BP 92/61 (BP Location: Left Arm, Patient Position: Sitting, Cuff Size: Normal)   Pulse 76   Resp 15   Ht  5\' 6"  (1.676 m)   Wt 140 lb (63.5 kg)   BMI 22.60 kg/m    Physical Exam  Constitutional: She is oriented to person, place, and time. She appears well-developed and well-nourished.  HENT:  Head: Normocephalic and atraumatic.  Eyes: Conjunctivae and EOM are normal.  Neck: Normal range of motion.  Cardiovascular: Normal  rate, regular rhythm, normal heart sounds and intact distal pulses.  Pulmonary/Chest: Effort normal and breath sounds normal.  Abdominal: Soft. Bowel sounds are normal.  Lymphadenopathy:    She has no cervical adenopathy.  Neurological: She is alert and oriented to person, place, and time.  Skin: Skin is warm and dry. Capillary refill takes less than 2 seconds.  Psychiatric: She has a normal mood and affect. Her behavior is normal.  Nursing note and vitals reviewed.    Musculoskeletal Exam: C-spine and thoracic lumbar spine good range of motion. Shoulder joints elbow joints wrist joints had swelling and synovitis as described below. She has synovitis over her PIP joints. Hip joints knee joints ankles MTPs PIPs are good range of motion with no synovitis.  CDAI Exam: CDAI Homunculus Exam:   Tenderness:  RUE: wrist LUE: glenohumeral Right hand: 2nd PIP Left hand: 2nd PIP and 3rd PIP  Swelling:  RUE: wrist Right hand: 2nd PIP Left hand: 2nd PIP and 3rd PIP  Joint Counts:  CDAI Tender Joint count: 5 CDAI Swollen Joint count: 4  Global Assessments:  Patient Global Assessment: 4 Provider Global Assessment: 4  CDAI Calculated Score: 17    Investigation: No additional findings.TB Gold: 08/28/2017 Negative  CBC Latest Ref Rng & Units 10/25/2017 08/28/2017 05/25/2017  WBC 3.8 - 10.8 Thousand/uL 8.7 6.6 4.9  Hemoglobin 11.7 - 15.5 g/dL 07/25/2017 42.7 06.2  Hematocrit 35.0 - 45.0 % 36.0 37.0 38.6  Platelets 140 - 400 Thousand/uL 286 279 248   CMP Latest Ref Rng & Units 10/25/2017 08/28/2017 05/25/2017  Glucose 65 - 99 mg/dL 80 84 90  BUN 7 - 25 mg/dL 9 10 7   Creatinine 0.50 - 1.10 mg/dL 07/25/2017 2.83  Sodium 135 - 146 mmol/L 139 140 139  Potassium 3.5 - 5.3 mmol/L 3.9 3.8 4.3  Chloride 98 - 110 mmol/L 104 105 107  CO2 20 - 32 mmol/L 28 28 24   Calcium 8.6 - 10.2 mg/dL 8.9 9.3 9.0  Total Protein 6.1 - 8.1 g/dL 6.8 7.4 6.5  Total Bilirubin 0.2 - 1.2 mg/dL 0.5 0.5 0.6  Alkaline Phos 33 -  115 U/L - - 74  AST 10 - 30 U/L 12 14 13   ALT 6 - 29 U/L 7 9 7     Imaging: No results found.  Speciality Comments: TB Gold: 10/04/2016 Negative     Procedures:  No procedures performed Allergies: Patient has no known allergies.   Assessment / Plan:     Visit Diagnoses: Rheumatoid arthritis with rheumatoid factor of multiple sites without organ or systems involvement (HCC) - Positive RF, positive anti-CCP, erosive disease. Patient continues to have active synovitis. We had detailed discussion regarding her treatment options. She states she was doing extremely well on IV Orencia. She believes the subcutaneous 7.61 is not working well. She could not afford to get her Orencia IV at the hospital setting. She would like to try to an office setting. We will try to request [facility] if she can infuse there. Would like to do it as soon as possible she's flaring. I will give her a prednisone taper starting at 20 mg by mouth  daily and taper by 5 mg every 4 days. We also discussed adding Arava to her regimen. She would like to hold off currently. I also discussed the option of other biologic agents and Xeljanz. As she had good response to IV Orencia she would like to try that first.  High risk medication use - OrenciaSQ q wk, Simponi SQ q month (02/28/2017) (methotrexate-headaches, inadequate response to Humira)  Sicca syndrome, unspecified (HCC): Over-the-counter products have been helpful.  Trochanteric bursitis of both hips doing better after exercises.   Orders: No orders of the defined types were placed in this encounter.  Meds ordered this encounter  Medications  . predniSONE (DELTASONE) 5 MG tablet    Sig: Take 4 tabs x4 days, then 3 tabs x4 days, then 2 tabs x4 days, then 1 tab x4 days    Dispense:  40 tablet    Refill:  0    Face-to-face time spent with patient was30 minutes. Greater than 50% of time was spent in counseling and coordination of care.  Follow-Up Instructions: Return in  about 3 months (around 02/04/2018) for Rheumatoid arthritis.   Pollyann Savoy, MD  Note - This record has been created using Animal nutritionist.  Chart creation errors have been sought, but may not always  have been located. Such creation errors do not reflect on  the standard of medical care.

## 2017-11-06 ENCOUNTER — Telehealth: Payer: Self-pay

## 2017-11-06 ENCOUNTER — Ambulatory Visit: Payer: BLUE CROSS/BLUE SHIELD | Admitting: Rheumatology

## 2017-11-06 ENCOUNTER — Encounter: Payer: Self-pay | Admitting: Rheumatology

## 2017-11-06 VITALS — BP 92/61 | HR 76 | Resp 15 | Ht 66.0 in | Wt 140.0 lb

## 2017-11-06 DIAGNOSIS — M7062 Trochanteric bursitis, left hip: Secondary | ICD-10-CM | POA: Diagnosis not present

## 2017-11-06 DIAGNOSIS — M0579 Rheumatoid arthritis with rheumatoid factor of multiple sites without organ or systems involvement: Secondary | ICD-10-CM | POA: Diagnosis not present

## 2017-11-06 DIAGNOSIS — M3500 Sicca syndrome, unspecified: Secondary | ICD-10-CM

## 2017-11-06 DIAGNOSIS — Z79899 Other long term (current) drug therapy: Secondary | ICD-10-CM

## 2017-11-06 DIAGNOSIS — M7061 Trochanteric bursitis, right hip: Secondary | ICD-10-CM

## 2017-11-06 MED ORDER — PREDNISONE 5 MG PO TABS: ORAL_TABLET | ORAL | 0 refills | Status: DC

## 2017-11-06 NOTE — Addendum Note (Signed)
Addended by: Henriette Combs on: 11/06/2017 11:33 AM   Modules accepted: Orders

## 2017-11-06 NOTE — Telephone Encounter (Signed)
Was asked to complete a BIV for Orencia IV for pt. She has [employer] and prefers to get her infusion at [facility]. Will you set her up to be seen by [facility] and request for it to be done urgently. She will have to be seen by them before she can infuse. They will be responsible for her authorization/pre-certifications for Orencia IV.  Thanks!  Lariyah Shetterly, [place], CPhT 11:23 AM

## 2017-11-06 NOTE — Telephone Encounter (Signed)
Urgent Referral has been placed.

## 2017-11-06 NOTE — Patient Instructions (Signed)
Standing Labs We placed an order today for your standing lab work.    Please come back and get your standing labs in April and every 3 months  We have open lab Monday through Friday from 8:30-11:30 AM and 1:30-4 PM at the office of Dr. Stran Raper.   The office is located at [address], [address], [place], [place] [address] No appointment is necessary.   Labs are drawn by [facility].  You may receive a bill from [facility] for your lab work. If you have any questions regarding directions or hours of operation,  please call [phone].    

## 2017-12-13 ENCOUNTER — Telehealth: Payer: Self-pay

## 2017-12-13 ENCOUNTER — Other Ambulatory Visit: Payer: Self-pay | Admitting: Rheumatology

## 2017-12-13 ENCOUNTER — Ambulatory Visit: Payer: BLUE CROSS/BLUE SHIELD | Admitting: Neurology

## 2017-12-13 ENCOUNTER — Encounter: Payer: Self-pay | Admitting: Neurology

## 2017-12-13 ENCOUNTER — Other Ambulatory Visit: Payer: Self-pay

## 2017-12-13 VITALS — BP 94/57 | HR 77 | Resp 14 | Ht 66.0 in | Wt 137.0 lb

## 2017-12-13 DIAGNOSIS — M3500 Sicca syndrome, unspecified: Secondary | ICD-10-CM | POA: Diagnosis not present

## 2017-12-13 DIAGNOSIS — Z79899 Other long term (current) drug therapy: Secondary | ICD-10-CM

## 2017-12-13 DIAGNOSIS — M0579 Rheumatoid arthritis with rheumatoid factor of multiple sites without organ or systems involvement: Secondary | ICD-10-CM

## 2017-12-13 NOTE — Telephone Encounter (Signed)
Received a refill request from [facility] for Orencia SQ. Patient was changed to Orencia IV in 10/2017. Her first visit with [facility] is on 2/24. Is it okay for pt to refill Orencia IV until she has her first infusion (not scheduled yet)?   Thanks!  Velma Hanna, [place], CPhT 2:22 PM

## 2017-12-13 NOTE — Telephone Encounter (Signed)
Yes she may stay on subcu Orencia until 1 week prior to her infusion.

## 2017-12-13 NOTE — Telephone Encounter (Signed)
Last Visit: 11/06/17 Next Visit: 02/12/18 Labs: 10/25/17 WNL TB Gold: 08/28/17 Neg   Okay to refill per Dr. Corliss Skains

## 2017-12-13 NOTE — Progress Notes (Signed)
[facility]  PATIENT: Dawn [name] DOB: Jan 11, 1981  REFERRING DOCTOR OR PCP:  Dr. Corliss Skains (Rheum) SOURCE: Patient, notes from Dr. Yong Channel, laboratory results.  _________________________________   HISTORICAL  CHIEF COMPLAINT:  Chief Complaint  Patient presents with  . Rheumatoid Arthritis    Dr. Corliss Skains pt. here for consult for Orencia to be given in our infusion suite for her RA/fim    HISTORY OF PRESENT ILLNESS:  Dawn [name] is a 37 year old woman with seropositive rheumatoid arthritis.  She was diagnosed with RA 3 years ago (was 6 weeks post-partum) and had severe wrist pain and also had foot paoin.   Tests showed a positive RF. In general her pain has been worse in the wrist, hands, toes and ankles.   She tried methotrexate x 3 and each time had severe headaches.    Humira caused injection site issues.    Orencia IV was well tolerated and beneficial.    Simponi was not effective.    Subcutaneous Orencia has not helped her nearly as much as IV.       She tolerated the IV Orencia very well.   Laboratory values were fine.   Prednisone has helped for flares.  Currently her worse pain is in the right wrist in the right ankle. She notes swelling and heat at the right wrist. She also notes deformed joints in both index fingers but not much pain currently there.  She stiffens up at night and has nocturnal joint pain. The joints are tender in the mornings and stiff x many hours.    She has reduced fine motor control due to her RA.    Gross motor function is not affected much.    She also has a sicca syndrome.  She has not had any difficulties with neuropathy or with uveitis. Does not note much neck pain.    She has a positive rheumatoid factor and a positive anti-CCP. 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   There is no family history.  She has tried and failed methotrexate, Humira, Simponi, subcutaneous Orencia. Her rheumatoid arthritis was well  controlled on IV Orencia in the past..     REVIEW OF SYSTEMS: Constitutional: No fevers, chills, sweats, or change in appetite.      She sleeps ok.  Some fatigeu Eyes: No visual changes, double vision, eye pain Ear, nose and throat: No hearing loss, ear pain, nasal congestion, sore throat Cardiovascular: No chest pain, palpitations Respiratory: No shortness of breath at rest or with exertion.   No wheezes GastrointestinaI: No nausea, vomiting, diarrhea, abdominal pain, fecal incontinence Genitourinary: No dysuria, urinary retention or frequency.  No nocturia. Musculoskeletal: No neck pain, back pain Integumentary: No rash, pruritus, skin lesions Neurological: as above Psychiatric: No depression at this time.  No anxiety Endocrine: No palpitations, diaphoresis, change in appetite, change in weigh or increased thirst Hematologic/Lymphatic: No anemia, purpura, petechiae. Allergic/Immunologic: No itchy/runny eyes, nasal congestion, recent allergic reactions, rashes  ALLERGIES: No Known Allergies  HOME MEDICATIONS:  Current Outpatient Medications:  .  Abatacept (ORENCIA CLICKJECT) 125 MG/ML SOAJ, Inject 125 mg once a week into the skin., Disp: 12 Syringe, Rfl: 0 .  acetaminophen (TYLENOL) 325 MG tablet, Take 650 mg by mouth once. One hour prior to infusion, Disp: , Rfl:  .  diphenhydrAMINE (BENADRYL) 25 mg capsule, Take 25 mg by mouth once. One hour prior to infusion, Disp: , Rfl:  .  folic acid (FOLVITE) 1 MG tablet, Take 2 tablets (2 mg  total) by mouth daily. (Patient not taking: Reported on 02/28/2017), Disp: 180 tablet, Rfl: 4 .  methotrexate 50 MG/2ML injection, Inject 0.6 mLs (15 mg total) into the vein once a week. (Patient not taking: Reported on 02/28/2017), Disp: 8 mL, Rfl: 0 .  Multiple Vitamin (MULTIVITAMIN) tablet, Take 1 tablet by mouth daily., Disp: , Rfl:  .  ondansetron (ZOFRAN) 4 MG tablet, Take 1 tablet (4 mg total) by mouth every 8 (eight) hours as needed for nausea or  vomiting. (Patient not taking: Reported on 02/28/2017), Disp: 20 tablet, Rfl: 2 .  predniSONE (DELTASONE) 5 MG tablet, Take 4 tabs x4 days, then 3 tabs x4 days, then 2 tabs x4 days, then 1 tab x4 days, Disp: 40 tablet, Rfl: 0 .  SIMPONI 50 MG/0.5ML SOAJ, INJECT 50 MG INTO THE SKIN EVERY 30 DAYS. (Patient not taking: Reported on 11/06/2017), Disp: 0.5 mL, Rfl: 1  PAST MEDICAL HISTORY: Past Medical History:  Diagnosis Date  . Arthritis   . H/O laparoscopy 05/05/13  . History of ectopic pregnancy 05/05/13  . Hx of unilateral salpingectomy 05/05/13   Left  . Hx of wisdom tooth extraction   . Medical history non-contributory     PAST SURGICAL HISTORY: Past Surgical History:  Procedure Laterality Date  . LAPAROSCOPY N/A 05/06/2013   Procedure: LAPAROSCOPY OPERATIVE, Left Salpingectomy, Removal Ectopic Pregnancy;  Surgeon: Mickel Baas, MD;  Location: [facility];  Service: Gynecology;  Laterality: N/A;  . WISDOM TOOTH EXTRACTION      FAMILY HISTORY: Family History  Problem Relation Age of Onset  . Psoriasis Sister     SOCIAL HISTORY:  Social History   Socioeconomic History  . Marital status: Married    Spouse name: Not on file  . Number of children: Not on file  . Years of education: Not on file  . Highest education level: Not on file  Social Needs  . Financial resource strain: Not on file  . Food insecurity - worry: Not on file  . Food insecurity - inability: Not on file  . Transportation needs - medical: Not on file  . Transportation needs - non-medical: Not on file  Occupational History  . Not on file  Tobacco Use  . Smoking status: Former Smoker    Types: Cigarettes  . Smokeless tobacco: Never Used  Substance and Sexual Activity  . Alcohol use: No  . Drug use: No  . Sexual activity: Yes    Birth control/protection: None  Other Topics Concern  . Not on file  Social History Narrative  . Not on file     PHYSICAL EXAM  Vitals:   12/13/17 1039  BP: (!) 94/57    Pulse: 77  Resp: 14  Weight: 137 lb (62.1 kg)  Height: 5\' 6"  (1.676 m)    Body mass index is 22.11 kg/m.   General: The patient is well-developed and well-nourished and in no acute distress  Eyes:  Funduscopic exam shows normal optic discs and retinal vessels.  Neck: The neck is supple, no carotid bruits are noted.  The neck is nontender.  Cardiovascular: The heart has a regular rate and rhythm with a normal S1 and S2. There were no murmurs, gallops or rubs. Lungs are clear to auscultation.  Skin/Ext: Extremities are without rash. In this and warmth at the right wrist and tenderness of the right ankle and toes. She has deformed joints in the index fingers bilaterally..  Musculoskeletal:  Back is nontender  Neurologic Exam  Mental status:  The patient is alert and oriented x 3 at the time of the examination. The patient has apparent normal recent and remote memory, with an apparently normal attention span and concentration ability.   Speech is normal.  Cranial nerves: Extraocular movements are full. Pupils are equal, round, and reactive to light and accomodation.  Visual fields are full.  Facial symmetry is present. There is good facial sensation to soft touch bilaterally.Facial strength is normal.  Trapezius and sternocleidomastoid strength is normal. No dysarthria is noted.  The tongue is midline, and the patient has symmetric elevation of the soft palate. No obvious hearing deficits are noted.  Motor:  Muscle bulk is normal.   Tone is normal. Strength is  5 / 5 in all 4 extremities.   Sensory: Sensory testing is intact to pinprick, soft touch and vibration sensation in all 4 extremities.  Coordination: Cerebellar testing reveals good finger-nose-finger and heel-to-shin bilaterally.  Gait and station: Station is normal.   Gait is normal. Tandem gait is normal. Romberg is negative.   Reflexes: Deep tendon reflexes are symmetric and normal bilaterally.   Plantar responses are  flexor.    DIAGNOSTIC DATA (LABS, IMAGING, TESTING) - I reviewed patient records, labs, notes, testing and imaging myself where available.  Lab Results  Component Value Date   WBC 8.7 10/25/2017   HGB 12.4 10/25/2017   HCT 36.0 10/25/2017   MCV 85.5 10/25/2017   PLT 286 10/25/2017      Component Value Date/Time   NA 139 10/25/2017 1324   K 3.9 10/25/2017 1324   CL 104 10/25/2017 1324   CO2 28 10/25/2017 1324   GLUCOSE 80 10/25/2017 1324   BUN 9 10/25/2017 1324   CREATININE 0.59 10/25/2017 1324   CALCIUM 8.9 10/25/2017 1324   PROT 6.8 10/25/2017 1324   ALBUMIN 4.2 05/25/2017 1350   AST 12 10/25/2017 1324   ALT 7 10/25/2017 1324   ALKPHOS 74 05/25/2017 1350   BILITOT 0.5 10/25/2017 1324   GFRNONAA 117 10/25/2017 1324   GFRAA 136 10/25/2017 1324       ASSESSMENT AND PLAN  Rheumatoid arthritis with rheumatoid factor of multiple sites without organ or systems involvement (HCC)  Sicca syndrome, unspecified (HCC)  High risk medication use    In summary, Ms. Cozza is a 37 year old woman with seropositive rheumatoid arthritis with pain in multiple sites. She failed methotrexate, Humira and Symponi.   She did well on IV Orencia with improvement of her joint pain, joint stiffness and swelling. She tolerated it well. There were some difficulties with her insurance and infusions and she was switched to subcutaneous Orencia but her RA was not adequately controlled. Therefore, it is medically necessary that she resume IV Orencia 500 mg every 28 days. We will try to get this medication approved for her and get her infusion as soon as possible. She will get lab work every 2-3 months and continue to see Dr. Corliss Skains.  Quanetta Truss A. Epimenio Foot, MD, [facility] 12/13/2017, 10:43 AM Certified in Neurology, Clinical Neurophysiology, Sleep Medicine, Pain Medicine and Neuroimaging  [facility] [address] [place], [place] [address] [phone]

## 2017-12-14 ENCOUNTER — Ambulatory Visit: Payer: BLUE CROSS/BLUE SHIELD | Admitting: Rheumatology

## 2017-12-14 NOTE — Telephone Encounter (Signed)
Called pt to update. She can use orenica SubQ up until 1 week prior to her infusion. She is scheduled to start in a couple of weeks. Patient voices understanding and denies any questions.   Antuane Eastridge, [place], CPhT 8:35 AM

## 2018-01-30 NOTE — Progress Notes (Signed)
Office Visit Note  Patient: Dawn [name]             Date of Birth: 11/12/1980           MRN: [id number]             PCP: [facility] Referring: [facility], [facility] Visit Date: 02/12/2018 Occupation: @GUAROCC @    Subjective:  Pain in multiple joints.   History of Present Illness: Dawn [name] is a 37 y.o. female with history of seropositive rheumatoid arthritis.  She was switched from subcu Orencia to IV Orencia.  She has had only one infusion so far.  She is failed methotrexate, Humira and Simponi in the past.  She continues to have some pain and stiffness in her bilateral hands and bilateral feet.  Her left elbow continues to hurt as well.  Her hands continue to have swelling especially in her bilateral index fingers  Activities of Daily Living:  Patient reports morning stiffness for2  hours.   Patient Reports nocturnal pain.  Difficulty dressing/grooming: Denies Difficulty climbing stairs: Reports Difficulty getting out of chair: Reports Difficulty using hands for taps, buttons, cutlery, and/or writing: Reports   Review of Systems  Constitutional: Positive for fatigue. Negative for night sweats, weight gain and weight loss.  HENT: Negative for mouth sores, trouble swallowing, trouble swallowing, mouth dryness and nose dryness.   Eyes: Negative for pain, redness, visual disturbance and dryness.  Respiratory: Negative for cough, shortness of breath and difficulty breathing.   Cardiovascular: Negative for chest pain, palpitations, hypertension, irregular heartbeat and swelling in legs/feet.  Gastrointestinal: Negative for blood in stool, constipation and diarrhea.  Endocrine: Negative for increased urination.  Genitourinary: Negative for vaginal dryness.  Musculoskeletal: Positive for arthralgias, joint pain, joint swelling and morning stiffness. Negative for myalgias, muscle weakness, muscle tenderness and myalgias.  Skin: Negative for color  change, rash, hair loss, skin tightness, ulcers and sensitivity to sunlight.  Allergic/Immunologic: Negative for susceptible to infections.  Neurological: Negative for dizziness, memory loss, night sweats and weakness.  Hematological: Negative for swollen glands.  Psychiatric/Behavioral: Negative for depressed mood and sleep disturbance. The patient is not nervous/anxious.     PMFS History:  Patient Active Problem List   Diagnosis Date Noted  . Sicca syndrome, unspecified (HCC) 02/21/2017  . High risk medication use 01/03/2017  . Rheumatoid arthritis with rheumatoid factor of multiple sites without organ or systems involvement (HCC) 06/26/2016  . Status post induction of labor 01/13/2015  . Term pregnancy 01/13/2015  . Spontaneous vaginal delivery 01/13/2015    Past Medical History:  Diagnosis Date  . Arthritis   . H/O laparoscopy 05/05/13  . History of ectopic pregnancy 05/05/13  . Hx of unilateral salpingectomy 05/05/13   Left  . Hx of wisdom tooth extraction   . Medical history non-contributory     Family History  Problem Relation Age of Onset  . Psoriasis Sister    Past Surgical History:  Procedure Laterality Date  . LAPAROSCOPY N/A 05/06/2013   Procedure: LAPAROSCOPY OPERATIVE, Left Salpingectomy, Removal Ectopic Pregnancy;  Surgeon: 05/08/2013, MD;  Location: [facility];  Service: Gynecology;  Laterality: N/A;  . WISDOM TOOTH EXTRACTION     Social History   Social History Narrative  . Not on file     Objective: Vital Signs: BP 95/65 (BP Location: Left Arm, Patient Position: Sitting, Cuff Size: Normal)   Pulse 70   Resp 12   Ht 5\' 6"  (1.676 m)  Wt 128 lb (58.1 kg)   BMI 20.66 kg/m    Physical Exam  Constitutional: She is oriented to person, place, and time. She appears well-developed and well-nourished.  HENT:  Head: Normocephalic and atraumatic.  Eyes: Conjunctivae and EOM are normal.  Neck: Normal range of motion.  Cardiovascular: Normal rate, regular  rhythm, normal heart sounds and intact distal pulses.  Pulmonary/Chest: Effort normal and breath sounds normal.  Abdominal: Soft. Bowel sounds are normal.  Lymphadenopathy:    She has no cervical adenopathy.  Neurological: She is alert and oriented to person, place, and time.  Skin: Skin is warm and dry. Capillary refill takes less than 2 seconds.  Psychiatric: She has a normal mood and affect. Her behavior is normal.  Nursing note and vitals reviewed.    Musculoskeletal Exam: C-spine thoracic lumbar spine good range of motion.  Shoulder joint swelling good range of motion.  She is contracture about 30 degrees in her left elbow with synovitis in her left elbow.  She also has synovitis in her right wrist joint.  Synovitis was noted in bilateral second PIP joints.  Hip joints and knee joints were in good range of motion.  She is tenderness across all MTPs.  CDAI Exam: CDAI Homunculus Exam:   Tenderness:  RUE: wrist LUE: ulnohumeral and radiohumeral Right hand: 2nd PIP Left hand: 2nd PIP Right foot: 1st MTP, 2nd MTP, 3rd MTP and 4th MTP Left foot: 1st MTP, 2nd MTP, 3rd MTP, 4th MTP and 5th MTP  Swelling:  RUE: wrist LUE: ulnohumeral and radiohumeral Right hand: 2nd PIP Left hand: 2nd PIP  Joint Counts:  CDAI Tender Joint count: 4 CDAI Swollen Joint count: 4  Global Assessments:  Patient Global Assessment: 4 Provider Global Assessment: 4  CDAI Calculated Score: 16    Investigation: No additional findings.TB Gold: 08/28/2017 Negative  CBC Latest Ref Rng & Units 10/25/2017 08/28/2017 05/25/2017  WBC 3.8 - 10.8 Thousand/uL 8.7 6.6 4.9  Hemoglobin 11.7 - 15.5 g/dL 99.6 92.4 93.2  Hematocrit 35.0 - 45.0 % 36.0 37.0 38.6  Platelets 140 - 400 Thousand/uL 286 279 248   CMP Latest Ref Rng & Units 10/25/2017 08/28/2017 05/25/2017  Glucose 65 - 99 mg/dL 80 84 90  BUN 7 - 25 mg/dL 9 10 7   Creatinine 0.50 - 1.10 mg/dL 4.19 9.14 4.45  Sodium 135 - 146 mmol/L 139 140 139  Potassium 3.5 - 5.3  mmol/L 3.9 3.8 4.3  Chloride 98 - 110 mmol/L 104 105 107  CO2 20 - 32 mmol/L 28 28 24   Calcium 8.6 - 10.2 mg/dL 8.9 9.3 9.0  Total Protein 6.1 - 8.1 g/dL 6.8 7.4 6.5  Total Bilirubin 0.2 - 1.2 mg/dL 0.5 0.5 0.6  Alkaline Phos 33 - 115 U/L - - 74  AST 10 - 30 U/L 12 14 13   ALT 6 - 29 U/L 7 9 7     Imaging: No results found.  Speciality Comments: TB Gold: 10/04/2016 Negative     Procedures:  Medium Joint Inj: L elbow on 02/12/2018 1:37 PM Indications: pain Details: 27 G 1.5 in needle, posterior approach Medications: 1 mL lidocaine 1 %; 30 mg triamcinolone acetonide 40 MG/ML Aspirate: 0 mL Outcome: tolerated well, no immediate complications Procedure, treatment alternatives, risks and benefits explained, specific risks discussed. Consent was given by the patient. Immediately prior to procedure a time out was called to verify the correct patient, procedure, equipment, support staff and site/side marked as required. Patient was prepped and draped in the  usual sterile fashion.     Allergies: Patient has no known allergies.   Assessment / Plan:     Visit Diagnoses: Rheumatoid arthritis with rheumatoid factor of multiple sites without organ or systems involvement (HCC) - Positive RF, positive anti-CCP, erosive disease.  She continues to have synovitis in multiple joints.  She has been on Orencia IV for 1 month only.  We had a detailed discussion regarding combination therapy.  She had intolerance to methotrexate in the past.  I discussed the option of using Arava.  Indications side effects contraindications were discussed at length.  She is willing to proceed with Arava.  Handout was given and consent was taken.  I will start her on Arava 10 mg p.o. daily for 2 weeks and then increase it to 20 mg p.o. daily if the labs are stable.  We will monitor labs every 2 weeks x 2 and then every 2 months.  I will for prednisone taper but she declined.  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  Sicca syndrome, unspecified (HCC) -using over-the-counter products.   Orders: Orders Placed This Encounter  Procedures  . Medium Joint Inj: L elbow   Meds ordered this encounter  Medications  . leflunomide (ARAVA) 20 MG tablet    Sig: Take 1/2 tablet by mouth daily for 2 weeks, if labs are stable increase to 1 tablet daily.    Dispense:  21 tablet    Refill:  0    Face-to-face time spent with patient was 30 minutes.  Greater than 50% of time was spent in counseling and coordination of care.  Follow-Up Instructions: Return in about 3 months (around 05/14/2018) for Rheumatoid arthritis.   Pollyann Savoy, MD  Note - This record has been created using Animal nutritionist.  Chart creation errors have been sought, but may not always  have been located. Such creation errors do not reflect on  the standard of medical care.

## 2018-02-12 ENCOUNTER — Encounter: Payer: Self-pay | Admitting: Rheumatology

## 2018-02-12 ENCOUNTER — Ambulatory Visit: Payer: BLUE CROSS/BLUE SHIELD | Admitting: Rheumatology

## 2018-02-12 VITALS — BP 95/65 | HR 70 | Resp 12 | Ht 66.0 in | Wt 128.0 lb

## 2018-02-12 DIAGNOSIS — M24522 Contracture, left elbow: Secondary | ICD-10-CM | POA: Diagnosis not present

## 2018-02-12 DIAGNOSIS — M0579 Rheumatoid arthritis with rheumatoid factor of multiple sites without organ or systems involvement: Secondary | ICD-10-CM | POA: Diagnosis not present

## 2018-02-12 DIAGNOSIS — Z79899 Other long term (current) drug therapy: Secondary | ICD-10-CM | POA: Diagnosis not present

## 2018-02-12 DIAGNOSIS — M3500 Sicca syndrome, unspecified: Secondary | ICD-10-CM | POA: Diagnosis not present

## 2018-02-12 MED ORDER — TRIAMCINOLONE ACETONIDE 40 MG/ML IJ SUSP
30.0000 mg | INTRAMUSCULAR | Status: AC | PRN
  Administered 2018-02-12: 30 mg via INTRA_ARTICULAR

## 2018-02-12 MED ORDER — LEFLUNOMIDE 20 MG PO TABS: ORAL_TABLET | ORAL | 0 refills | Status: DC

## 2018-02-12 MED ORDER — LIDOCAINE HCL 1 % IJ SOLN
1.0000 mL | INTRAMUSCULAR | Status: AC | PRN
  Administered 2018-02-12: 1 mL

## 2018-02-12 NOTE — Patient Instructions (Signed)
Take leflunomide 20 mg, half tablet daily for 2 weeks and then 1 tablet daily. Standing Labs We placed an order today for your standing lab work.    Please come back and get your standing labs in 2 weeks x 2 and then every 2 months with infusion  We have open lab Monday through Friday from 8:30-11:30 AM and 1:30-4:00 PM  at the office of Dr. Pollyann Savoy.   You may experience shorter wait times on Monday and Friday afternoons. The office is located at [address], [address], [place], [place] [address] No appointment is necessary.   Labs are drawn by [facility].  You may receive a bill from [facility] for your lab work. If you have any questions regarding directions or hours of operation,  please call [phone].      Leflunomide tablets What is this medicine? LEFLUNOMIDE (le FLOO na mide) is for rheumatoid arthritis. This medicine may be used for other purposes; ask your health care provider or pharmacist if you have questions. COMMON BRAND NAME(S): Arava What should I tell my health care provider before I take this medicine? They need to know if you have any of these conditions: -alcoholism -bone marrow problems -fever or infection -immune system problems -kidney disease -liver disease -an unusual or allergic reaction to leflunomide, teriflunomide, other medicines, lactose, foods, dyes, or preservatives -pregnant or trying to get pregnant -breast-feeding How should I use this medicine? Take this medicine by mouth with a full glass of water. Follow the directions on the prescription label. Take your medicine at regular intervals. Do not take your medicine more often than directed. Do not stop taking except on your doctor's advice. Talk to your pediatrician regarding the use of this medicine in children. Special care may be needed. Overdosage: If you think you have taken too much of this medicine contact a poison control center or emergency room at once. NOTE: This medicine is  only for you. Do not share this medicine with others. What if I miss a dose? If you miss a dose, take it as soon as you can. If it is almost time for your next dose, take only that dose. Do not take double or extra doses. What may interact with this medicine? Do not take this medicine with any of the following medications: -teriflunomide This medicine may also interact with the following medications: -charcoal -cholestyramine -methotrexate -NSAIDs, medicines for pain and inflammation, like ibuprofen or naproxen -phenytoin -rifampin -tolbutamide -vaccines -warfarin This list may not describe all possible interactions. Give your health care provider a list of all the medicines, herbs, non-prescription drugs, or dietary supplements you use. Also tell them if you smoke, drink alcohol, or use illegal drugs. Some items may interact with your medicine. What should I watch for while using this medicine? Visit your doctor or health care professional for regular checks on your progress. You will need frequent blood checks while you are receiving the medicine. If you get a cold or other infection while receiving this medicine, call your doctor or health care professional. Do not treat yourself. The medicine may increase your risk of getting an infection. If you are a woman who has the potential to become pregnant, discuss birth control options with your doctor or health care professional. Bonita Quin must not be pregnant, and you must be using a reliable form of birth control. The medicine may harm an unborn baby. Immediately call your doctor if you think you might be pregnant. Alcoholic drinks may increase possible damage  to your liver. Do not drink alcohol while taking this medicine. What side effects may I notice from receiving this medicine? Side effects that you should report to your doctor or health care professional as soon as possible: -allergic reactions like skin rash, itching or hives, swelling of  the face, lips, or tongue -cough -difficulty breathing or shortness of breath -fever, chills or any other sign of infection -redness, blistering, peeling or loosening of the skin, including inside the mouth -unusual bleeding or bruising -unusually weak or tired -vomiting -yellowing of eyes or skin Side effects that usually do not require medical attention (report to your doctor or health care professional if they continue or are bothersome): -diarrhea -hair loss -headache -nausea This list may not describe all possible side effects. Call your doctor for medical advice about side effects. You may report side effects to FDA at 1-800-FDA-1088. Where should I keep my medicine? Keep out of the reach of children. Store at room temperature between 15 and 30 degrees C (59 and 86 degrees F). Protect from moisture and light. Throw away any unused medicine after the expiration date. NOTE: This sheet is a summary. It may not cover all possible information. If you have questions about this medicine, talk to your doctor, pharmacist, or health care provider.  2018 Elsevier/Gold Standard (2013-10-07 10:53:11)

## 2018-02-25 ENCOUNTER — Telehealth: Payer: Self-pay | Admitting: Rheumatology

## 2018-02-25 ENCOUNTER — Telehealth: Payer: Self-pay

## 2018-02-25 NOTE — Telephone Encounter (Signed)
Patient returning your call.

## 2018-02-25 NOTE — Telephone Encounter (Signed)
See previous telephone note.   Emilian Stawicki, [place], CPhT 1:13 PM

## 2018-02-25 NOTE — Telephone Encounter (Signed)
Received a message from [facility]. They have not had any success in reaching the pt to schedule shipment of Orencia. She is due for a refill.   Called pt to update. Left voicemail.   Clelia Trabucco, [place], CPhT 11:38 AM

## 2018-02-25 NOTE — Telephone Encounter (Addendum)
Spoke with patient. She states that she no longer gets the Orencia SQ. She now get Orencia IV through [facility]. She mentions that her Rx plan has changed on May 1st but her health plans remains the same. She will let [facility] know to make sure nothing will change regarding her infusions.   Informed the pharmacy of the update.   Ursula Dermody, [place], CPhT 1:12 PM

## 2018-02-26 ENCOUNTER — Other Ambulatory Visit: Payer: Self-pay | Admitting: *Deleted

## 2018-02-26 DIAGNOSIS — M069 Rheumatoid arthritis, unspecified: Secondary | ICD-10-CM

## 2018-02-26 DIAGNOSIS — M0579 Rheumatoid arthritis with rheumatoid factor of multiple sites without organ or systems involvement: Secondary | ICD-10-CM

## 2018-02-26 NOTE — Addendum Note (Signed)
Addended by: Tamera Stands D on: 02/26/2018 04:20 PM   Modules accepted: Orders

## 2018-02-27 ENCOUNTER — Telehealth: Payer: Self-pay | Admitting: *Deleted

## 2018-02-27 LAB — CBC WITH DIFFERENTIAL/PLATELET
BASOS: 0 %
Basophils Absolute: 0 10*3/uL (ref 0.0–0.2)
EOS (ABSOLUTE): 0.1 10*3/uL (ref 0.0–0.4)
EOS: 1 %
HEMATOCRIT: 37.4 % (ref 34.0–46.6)
HEMOGLOBIN: 12.4 g/dL (ref 11.1–15.9)
Immature Grans (Abs): 0 10*3/uL (ref 0.0–0.1)
Immature Granulocytes: 0 %
LYMPHS ABS: 1.6 10*3/uL (ref 0.7–3.1)
Lymphs: 29 %
MCH: 28.1 pg (ref 26.6–33.0)
MCHC: 33.2 g/dL (ref 31.5–35.7)
MCV: 85 fL (ref 79–97)
Monocytes Absolute: 0.4 10*3/uL (ref 0.1–0.9)
Monocytes: 8 %
NEUTROS ABS: 3.4 10*3/uL (ref 1.4–7.0)
Neutrophils: 62 %
Platelets: 251 10*3/uL (ref 150–379)
RBC: 4.41 x10E6/uL (ref 3.77–5.28)
RDW: 14.4 % (ref 12.3–15.4)
WBC: 5.6 10*3/uL (ref 3.4–10.8)

## 2018-02-27 LAB — COMPREHENSIVE METABOLIC PANEL
ALBUMIN: 4.4 g/dL (ref 3.5–5.5)
ALK PHOS: 90 IU/L (ref 39–117)
ALT: 9 IU/L (ref 0–32)
AST: 10 IU/L (ref 0–40)
Albumin/Globulin Ratio: 1.7 (ref 1.2–2.2)
BUN / CREAT RATIO: 20 (ref 9–23)
BUN: 17 mg/dL (ref 6–20)
Bilirubin Total: 0.3 mg/dL (ref 0.0–1.2)
CO2: 24 mmol/L (ref 20–29)
CREATININE: 0.85 mg/dL (ref 0.57–1.00)
Calcium: 9.1 mg/dL (ref 8.7–10.2)
Chloride: 104 mmol/L (ref 96–106)
GFR, EST AFRICAN AMERICAN: 101 mL/min/{1.73_m2} (ref >59)
GFR, EST NON AFRICAN AMERICAN: 87 mL/min/{1.73_m2} (ref >59)
GLOBULIN, TOTAL: 2.6 g/dL (ref 1.5–4.5)
Glucose: 98 mg/dL (ref 65–99)
Potassium: 3.8 mmol/L (ref 3.5–5.2)
SODIUM: 143 mmol/L (ref 134–144)
Total Protein: 7 g/dL (ref 6.0–8.5)

## 2018-02-27 NOTE — Telephone Encounter (Signed)
Spoke with pt. and reviewed below lab results. She verbalized understanding of same/fim 

## 2018-02-27 NOTE — Telephone Encounter (Signed)
-----   Message from Asa Lente, MD sent at 02/27/2018  9:28 AM EDT ----- Please let the patient know that the lab work is fine.

## 2018-03-12 ENCOUNTER — Other Ambulatory Visit: Payer: Self-pay | Admitting: Rheumatology

## 2018-03-12 ENCOUNTER — Other Ambulatory Visit: Payer: Self-pay

## 2018-03-12 ENCOUNTER — Telehealth: Payer: Self-pay | Admitting: Rheumatology

## 2018-03-12 DIAGNOSIS — Z79899 Other long term (current) drug therapy: Secondary | ICD-10-CM

## 2018-03-12 NOTE — Telephone Encounter (Addendum)
Last visit: 02/12/2018 Next visit: sent message to the front to schedule patient. Labs: 02/26/2018 WNL   Okay to refill per Dr. Corliss Skains.

## 2018-03-12 NOTE — Telephone Encounter (Signed)
-----   Message from Ellen Henri, CMA sent at 03/12/2018  8:48 AM EDT ----- Please call patient and schedule follow up appointment, she is due in July 2019. Thanks!

## 2018-03-12 NOTE — Telephone Encounter (Signed)
[facility] for patient to call and schedule follow-up appointment due in July 2019

## 2018-03-13 LAB — CBC WITH DIFFERENTIAL/PLATELET
BASOS PCT: 0.4 %
Basophils Absolute: 21 cells/uL (ref 0–200)
EOS PCT: 1.4 %
Eosinophils Absolute: 73 cells/uL (ref 15–500)
HEMATOCRIT: 34.8 % — AB (ref 35.0–45.0)
HEMOGLOBIN: 11.5 g/dL — AB (ref 11.7–15.5)
LYMPHS ABS: 2023 {cells}/uL (ref 850–3900)
MCH: 28.3 pg (ref 27.0–33.0)
MCHC: 33 g/dL (ref 32.0–36.0)
MCV: 85.7 fL (ref 80.0–100.0)
MPV: 10.6 fL (ref 7.5–12.5)
Monocytes Relative: 7.4 %
NEUTROS ABS: 2699 {cells}/uL (ref 1500–7800)
Neutrophils Relative %: 51.9 %
Platelets: 214 10*3/uL (ref 140–400)
RBC: 4.06 10*6/uL (ref 3.80–5.10)
RDW: 13.6 % (ref 11.0–15.0)
Total Lymphocyte: 38.9 %
WBC mixed population: 385 cells/uL (ref 200–950)
WBC: 5.2 10*3/uL (ref 3.8–10.8)

## 2018-03-13 LAB — COMPLETE METABOLIC PANEL WITH GFR
AG Ratio: 1.7 (calc) (ref 1.0–2.5)
ALBUMIN MSPROF: 4.1 g/dL (ref 3.6–5.1)
ALT: 8 U/L (ref 6–29)
AST: 13 U/L (ref 10–30)
Alkaline phosphatase (APISO): 72 U/L (ref 33–115)
BILIRUBIN TOTAL: 0.5 mg/dL (ref 0.2–1.2)
BUN: 14 mg/dL (ref 7–25)
CHLORIDE: 104 mmol/L (ref 98–110)
CO2: 28 mmol/L (ref 20–32)
Calcium: 8.9 mg/dL (ref 8.6–10.2)
Creat: 0.58 mg/dL (ref 0.50–1.10)
GFR, EST AFRICAN AMERICAN: 136 mL/min/{1.73_m2} (ref >=60)
GFR, Est Non African American: 117 mL/min/{1.73_m2} (ref >=60)
GLOBULIN: 2.4 g/dL (ref 1.9–3.7)
GLUCOSE: 80 mg/dL (ref 65–99)
Potassium: 3.8 mmol/L (ref 3.5–5.3)
SODIUM: 139 mmol/L (ref 135–146)
TOTAL PROTEIN: 6.5 g/dL (ref 6.1–8.1)

## 2018-03-13 NOTE — Progress Notes (Signed)
Labs are stable.  Mild anemia noted.  Please advise patient to take multivitamin with iron.

## 2018-05-07 NOTE — Progress Notes (Signed)
Office Visit Note  Patient: Dawn [name]             Date of Birth: December 26, 1980           MRN: [id number]             PCP: [facility] Referring: [facility], [facility]* Visit Date: 05/21/2018 Occupation: @GUAROCC @  Subjective:  Joint stiffness   History of Present Illness: Dawn [name] is a 37 y.o. female with history of seropositive rheumatoid arthritis and sicca syndrome.  Patient continues to get Orencia IV infusions and takes Arava 20 mg by mouth daily. She denies any recent flares.  She reports she has noticed improvement since starting on Arava 20 mg daily.  She has been tolerating it well with no side effects.  She states she prefers the Orencia infusions rather than the subcutaneous injections.  She states she is due for her next infusion today.  She will also have blood work at her infusion today.  She states she was mildly anemic and has been taking a multivitamin with iron daily.  She states she continues to have some joint pain or joint swelling in her left second PIP joint.  She denies any other joint pain at this time.  She states her morning stiffness has improved and is only lasting about 1 hour.  She denies any elbow pain at this time.  She states that the left elbow cortisone injection provided pain relief.  She states she occasionally has bilateral wrist pain especially if she is overdoing it.   Activities of Daily Living:  Patient reports morning stiffness for 1  hour.   Patient Denies nocturnal pain.  Difficulty dressing/grooming: Denies Difficulty climbing stairs: Denies Difficulty getting out of chair: Denies Difficulty using hands for taps, buttons, cutlery, and/or writing: Denies  Review of Systems  Constitutional: Negative for fatigue.  HENT: Negative for mouth sores, mouth dryness and nose dryness.   Eyes: Negative for pain, visual disturbance and dryness.  Respiratory: Negative for cough, hemoptysis, shortness of breath and  difficulty breathing.   Cardiovascular: Negative for chest pain, palpitations, hypertension and swelling in legs/feet.  Gastrointestinal: Negative for blood in stool, constipation and diarrhea.  Endocrine: Negative for increased urination.  Genitourinary: Negative for painful urination.  Musculoskeletal: Positive for arthralgias, joint pain, joint swelling and morning stiffness. Negative for myalgias, muscle weakness, muscle tenderness and myalgias.  Skin: Negative for color change, pallor, rash, hair loss, nodules/bumps, skin tightness, ulcers and sensitivity to sunlight.  Allergic/Immunologic: Negative for susceptible to infections.  Neurological: Negative for dizziness, numbness, headaches and weakness.  Hematological: Negative for swollen glands.  Psychiatric/Behavioral: Negative for depressed mood and sleep disturbance. The patient is not nervous/anxious.     PMFS History:  Patient Active Problem List   Diagnosis Date Noted  . Sicca syndrome, unspecified (HCC) 02/21/2017  . High risk medication use 01/03/2017  . Rheumatoid arthritis with rheumatoid factor of multiple sites without organ or systems involvement (HCC) 06/26/2016  . Status post induction of labor 01/13/2015  . Term pregnancy 01/13/2015  . Spontaneous vaginal delivery 01/13/2015    Past Medical History:  Diagnosis Date  . Arthritis   . H/O laparoscopy 05/05/13  . History of ectopic pregnancy 05/05/13  . Hx of unilateral salpingectomy 05/05/13   Left  . Hx of wisdom tooth extraction   . Medical history non-contributory     Family History  Problem Relation Age of Onset  . Psoriasis Sister    Past  Surgical History:  Procedure Laterality Date  . LAPAROSCOPY N/A 05/06/2013   Procedure: LAPAROSCOPY OPERATIVE, Left Salpingectomy, Removal Ectopic Pregnancy;  Surgeon: Mickel Baas, MD;  Location: [facility];  Service: Gynecology;  Laterality: N/A;  . WISDOM TOOTH EXTRACTION     Social History   Social History  Narrative  . Not on file    Objective: Vital Signs: BP (!) 101/59 (BP Location: Left Arm, Patient Position: Sitting, Cuff Size: Normal)   Pulse 81   Resp 12   Ht 5\' 6"  (1.676 m)   Wt 129 lb (58.5 kg)   BMI 20.82 kg/m    Physical Exam  Constitutional: She is oriented to person, place, and time. She appears well-developed and well-nourished.  HENT:  Head: Normocephalic and atraumatic.  Eyes: Conjunctivae and EOM are normal.  Neck: Normal range of motion.  Cardiovascular: Normal rate, regular rhythm, normal heart sounds and intact distal pulses.  Pulmonary/Chest: Effort normal and breath sounds normal.  Abdominal: Soft. Bowel sounds are normal.  Lymphadenopathy:    She has no cervical adenopathy.  Neurological: She is alert and oriented to person, place, and time.  Skin: Skin is warm and dry. Capillary refill takes less than 2 seconds.  Psychiatric: She has a normal mood and affect. Her behavior is normal.  Nursing note and vitals reviewed.    Musculoskeletal Exam: C-spine, thoracic spine, lumbar spine good range of motion.  No midline spinal tenderness.  No SI joint tenderness.  Shoulder joints, elbow joints, wrist joints, MCPs, PIPs, DIPs good range of motion.  Left second PIP synovitis and tenderness noted.  Complete fist formation bilaterally.  Hip joints, knee joints, ankle joints, MTPs, PIPs, DIPs good range of motion with no synovitis.  No warmth or effusion of bilateral knee joints.  Bilateral knee crepitus.  No tenderness of trochanter bursa bilaterally.  CDAI Exam: CDAI Homunculus Exam:   Tenderness:  Left hand: 2nd PIP  Swelling:  Left hand: 2nd PIP  Joint Counts:  CDAI Tender Joint count: 1 CDAI Swollen Joint count: 1  Global Assessments:  Patient Global Assessment: 3 Provider Global Assessment: 3  CDAI Calculated Score: 8   Investigation: No additional findings.  Imaging: No results found.  Recent Labs: Lab Results  Component Value Date   WBC 5.2  03/12/2018   HGB 11.5 (L) 03/12/2018   PLT 214 03/12/2018   NA 139 03/12/2018   K 3.8 03/12/2018   CL 104 03/12/2018   CO2 28 03/12/2018   GLUCOSE 80 03/12/2018   BUN 14 03/12/2018   CREATININE 0.58 03/12/2018   BILITOT 0.5 03/12/2018   ALKPHOS 90 02/26/2018   AST 13 03/12/2018   ALT 8 03/12/2018   PROT 6.5 03/12/2018   ALBUMIN 4.4 02/26/2018   CALCIUM 8.9 03/12/2018   GFRAA 136 03/12/2018   QFTBGOLD NEGATIVE 08/28/2017    Speciality Comments: TB Gold: 10/04/2016 Negative   Procedures:  No procedures performed Allergies: Patient has no known allergies.    10/06/2016 started in March 2019 Arava April 2019   Assessment / Plan:     Visit Diagnoses: Rheumatoid arthritis with rheumatoid factor of multiple sites without organ or systems involvement (HCC) - Positive RF, positive anti-CCP, erosive disease: She has left second PIP joint tenderness and synovitis.  She has no significant improvement in her left elbow since a cortisone injection on 02/12/2018.  She has no warmth or effusion of the left elbow on exam today.  She occasionally has discomfort in bilateral wrist joints especially if  she is doing overuse activities but no synovitis is noted.  She has noted significant benefit since switching from subcutaneous to IV infusions of Orencia and starting on Arava 20 mg daily. She has been tolerating Arava with no side effects.  Her next Orencia infusion is today.  She will continue on this current treatment regimen.  She does not need any refills of Arava at this time.  She continues to have joint pain and swelling in the left second PIP joint she can return for a cortisone injection.  She will follow back up in the office in 3 months.  She is advised to notify us if she develops increased joint pain or joint swelling in the meantime.  High risk medication use - IV Orencia and Arava 20 mg po daily (methotrexate-headaches, inadequate response to Humira, Simponi, subcu Orencia).  CBC on  03/12/2018 revealed mild anemia patient started taking multivitamin with iron.  CBC and CMP will be drawn today at the hospital where she gets her infusions.  She will CBC and CMP drawn every 3 months to monitor for drug toxicity.  Contracture of left elbow: Improved.  She no significant improvement following the left elbow cortisone injection on 02/12/2018.  She has no tenderness or effusion on exam today.  Sicca syndrome, unspecified (HCC): She denies any sicca symptoms at this time.   Trochanteric bursitis of both hips: Resolved.  She has no tenderness on exam.    Orders: No orders of the defined types were placed in this encounter.  No orders of the defined types were placed in this encounter.    Follow-Up Instructions: Return in about 3 months (around 08/21/2018) for Rheumatoid arthritis, Sicca syndrome .   Gearldine Bienenstock, PA-C  Note - This record has been created using Dragon software.  Chart creation errors have been sought, but may not always  have been located. Such creation errors do not reflect on  the standard of medical care.

## 2018-05-21 ENCOUNTER — Encounter: Payer: Self-pay | Admitting: Physician Assistant

## 2018-05-21 ENCOUNTER — Other Ambulatory Visit: Payer: Self-pay | Admitting: Neurology

## 2018-05-21 ENCOUNTER — Ambulatory Visit: Payer: BLUE CROSS/BLUE SHIELD | Admitting: Physician Assistant

## 2018-05-21 VITALS — BP 101/59 | HR 81 | Resp 12 | Ht 66.0 in | Wt 129.0 lb

## 2018-05-21 DIAGNOSIS — Z79899 Other long term (current) drug therapy: Secondary | ICD-10-CM | POA: Diagnosis not present

## 2018-05-21 DIAGNOSIS — M0579 Rheumatoid arthritis with rheumatoid factor of multiple sites without organ or systems involvement: Secondary | ICD-10-CM

## 2018-05-21 DIAGNOSIS — M24522 Contracture, left elbow: Secondary | ICD-10-CM

## 2018-05-21 DIAGNOSIS — M7062 Trochanteric bursitis, left hip: Secondary | ICD-10-CM

## 2018-05-21 DIAGNOSIS — M3500 Sicca syndrome, unspecified: Secondary | ICD-10-CM

## 2018-05-21 DIAGNOSIS — M7061 Trochanteric bursitis, right hip: Secondary | ICD-10-CM

## 2018-05-21 NOTE — Addendum Note (Signed)
Addended by: Tehran Rabenold D on: 05/21/2018 02:59 PM   Modules accepted: Orders  

## 2018-05-21 NOTE — Patient Instructions (Signed)
Standing Labs We placed an order today for your standing lab work.    Please come back and get your standing labs in August and every 3 months   We have open lab Monday through Friday from 8:30-11:30 AM and 1:30-4:00 PM  at the office of Dr. Shaili Deveshwar.   You may experience shorter wait times on Monday and Friday afternoons. The office is located at [address], [address], [place], [place] [address] No appointment is necessary.   Labs are drawn by [facility].  You may receive a bill from [facility] for your lab work. If you have any questions regarding directions or hours of operation,  please call [phone].    

## 2018-05-21 NOTE — Addendum Note (Signed)
Addended by: Tamera Stands D on: 05/21/2018 02:59 PM   Modules accepted: Orders

## 2018-05-22 ENCOUNTER — Telehealth: Payer: Self-pay

## 2018-05-22 LAB — CBC WITH DIFFERENTIAL/PLATELET
BASOS ABS: 0.1 10*3/uL (ref 0.0–0.2)
Basos: 1 %
EOS (ABSOLUTE): 0.4 10*3/uL (ref 0.0–0.4)
Eos: 7 %
Hematocrit: 38.3 % (ref 34.0–46.6)
Hemoglobin: 12.6 g/dL (ref 11.1–15.9)
IMMATURE GRANS (ABS): 0 10*3/uL (ref 0.0–0.1)
IMMATURE GRANULOCYTES: 0 %
LYMPHS: 36 %
Lymphocytes Absolute: 1.9 10*3/uL (ref 0.7–3.1)
MCH: 30.3 pg (ref 26.6–33.0)
MCHC: 32.9 g/dL (ref 31.5–35.7)
MCV: 92 fL (ref 79–97)
MONOCYTES: 8 %
Monocytes Absolute: 0.4 10*3/uL (ref 0.1–0.9)
NEUTROS ABS: 2.5 10*3/uL (ref 1.4–7.0)
Neutrophils: 48 %
Platelets: 208 10*3/uL (ref 150–450)
RBC: 4.16 x10E6/uL (ref 3.77–5.28)
RDW: 14.2 % (ref 12.3–15.4)
WBC: 5.2 10*3/uL (ref 3.4–10.8)

## 2018-05-22 LAB — COMPREHENSIVE METABOLIC PANEL
ALT: 7 IU/L (ref 0–32)
AST: 14 IU/L (ref 0–40)
Albumin/Globulin Ratio: 2 (ref 1.2–2.2)
Albumin: 4.5 g/dL (ref 3.5–5.5)
Alkaline Phosphatase: 75 IU/L (ref 39–117)
BILIRUBIN TOTAL: 0.6 mg/dL (ref 0.0–1.2)
BUN/Creatinine Ratio: 25 — ABNORMAL HIGH (ref 9–23)
BUN: 13 mg/dL (ref 6–20)
CHLORIDE: 105 mmol/L (ref 96–106)
CO2: 21 mmol/L (ref 20–29)
Calcium: 8.6 mg/dL — ABNORMAL LOW (ref 8.7–10.2)
Creatinine, Ser: 0.52 mg/dL — ABNORMAL LOW (ref 0.57–1.00)
GFR calc non Af Amer: 122 mL/min/{1.73_m2} (ref >59)
GFR, EST AFRICAN AMERICAN: 140 mL/min/{1.73_m2} (ref >59)
GLUCOSE: 95 mg/dL (ref 65–99)
Globulin, Total: 2.2 g/dL (ref 1.5–4.5)
Potassium: 4 mmol/L (ref 3.5–5.2)
Sodium: 140 mmol/L (ref 134–144)
TOTAL PROTEIN: 6.7 g/dL (ref 6.0–8.5)

## 2018-05-22 NOTE — Telephone Encounter (Signed)
-----   Message from Asa Lente, MD sent at 05/22/2018  8:41 AM EDT ----- Please let the patient know that the lab work is fine.

## 2018-05-22 NOTE — Telephone Encounter (Signed)
I called and spoke with patient and made her aware of her normal lab results. She voiced understanding and appreciation.

## 2018-05-31 ENCOUNTER — Telehealth: Payer: Self-pay | Admitting: Rheumatology

## 2018-05-31 NOTE — Telephone Encounter (Signed)
Patient called stating she has been having stomach pain (nausea, sharp pains from chest to belly button, and feeling of being hungry but unable to eat) since yesterday morning.  Patient is worried it might be an ulcer and is asking if this could be a side effect of her Orencia infusions and/or Arava.  Patient is requesting a return call.

## 2018-05-31 NOTE — Telephone Encounter (Signed)
Patient should be seen by her PCP or should go to the emergency room.

## 2018-05-31 NOTE — Telephone Encounter (Signed)
Advised patient to go to the emergency room or PCP, patient verbalized understanding. Patient will call us back if any new medications are prescribed for her.

## 2018-06-18 ENCOUNTER — Other Ambulatory Visit: Payer: Self-pay | Admitting: Rheumatology

## 2018-06-18 NOTE — Telephone Encounter (Signed)
Last Visit: 05/21/18 Next visit: 12/16/18 Labs: 05/21/18 stable  Okay to refill per Dr. Corliss Skains

## 2018-08-19 NOTE — Progress Notes (Signed)
Office Visit Note  Patient: Dawn [name]             Date of Birth: [birth year]           MRN: [id number]             PCP: [facility] Referring: [facility], [facility]* Visit Date: 08/26/2018 Occupation: @GUAROCC @  Subjective:  Left 2nd PIP joint pain    History of Present Illness: Dezhane Staten is a 37 y.o. female with history of seropositive rheumatoid arthritis.  She is on IV Orencia and Arava 20 mg by mouth daily. She states her next infusion in 2 weeks.  She states she rolled her left foot on 08/24/18 and was having pain on the lateral aspect.  She was evaluated in the ED and has a left proximal 5th metatarsal fracture.  She was given a posterior split and crutches.  She has been taking Ibuprofen for pain relief. She has not been evaluated by an orthopedist yet. She has been having increased pain and swelling in both wrist joints due to using crutches.  She reports the right elbow joint has been feeling much better since the cortisone injection.  She is having severe left 2nd PIP joint pain.  She has also been having increased swelling and stiffness in this joint.  She would like a cortisone injection today.  She has never had a cortisone injection in this joint.     Activities of Daily Living:  Patient reports morning stiffness all day.   Patient Reports nocturnal pain.  Difficulty dressing/grooming: Reports Difficulty climbing stairs: Reports Difficulty getting out of chair: Denies Difficulty using hands for taps, buttons, cutlery, and/or writing: Reports  Review of Systems  Constitutional: Negative for fatigue.  HENT: Negative for mouth sores, trouble swallowing, trouble swallowing, mouth dryness and nose dryness.   Eyes: Negative for pain, redness, itching, visual disturbance and dryness.  Respiratory: Negative for cough, hemoptysis, shortness of breath, wheezing and difficulty breathing.   Cardiovascular: Negative for chest pain, palpitations,  hypertension and swelling in legs/feet.  Gastrointestinal: Negative for abdominal pain, blood in stool, constipation, diarrhea, nausea and vomiting.  Endocrine: Negative for increased urination.  Genitourinary: Negative for painful urination, nocturia and pelvic pain.  Musculoskeletal: Positive for arthralgias, joint pain, joint swelling and morning stiffness. Negative for myalgias, muscle weakness, muscle tenderness and myalgias.  Skin: Negative for color change, pallor, rash, hair loss, nodules/bumps, skin tightness, ulcers and sensitivity to sunlight.  Allergic/Immunologic: Negative for susceptible to infections.  Neurological: Negative for dizziness, light-headedness, numbness, headaches, memory loss and weakness.  Hematological: Negative for swollen glands.  Psychiatric/Behavioral: Negative for depressed mood, confusion and sleep disturbance. The patient is not nervous/anxious.     PMFS History:  Patient Active Problem List   Diagnosis Date Noted  . Sicca syndrome, unspecified (HCC) 02/21/2017  . High risk medication use 01/03/2017  . Rheumatoid arthritis with rheumatoid factor of multiple sites without organ or systems involvement (HCC) 06/26/2016  . Status post induction of labor 01/13/2015  . Term pregnancy 01/13/2015  . Spontaneous vaginal delivery 01/13/2015    Past Medical History:  Diagnosis Date  . Arthritis   . H/O laparoscopy 05/05/13  . History of ectopic pregnancy 05/05/13  . Hx of unilateral salpingectomy 05/05/13   Left  . Hx of wisdom tooth extraction   . Medical history non-contributory     Family History  Problem Relation Age of Onset  . Psoriasis Sister    Past Surgical History:  Procedure Laterality Date  . LAPAROSCOPY N/A 05/06/2013   Procedure: LAPAROSCOPY OPERATIVE, Left Salpingectomy, Removal Ectopic Pregnancy;  Surgeon: Mickel Baas, MD;  Location: [facility];  Service: Gynecology;  Laterality: N/A;  . WISDOM TOOTH EXTRACTION     Social History    Social History Narrative  . Not on file    Objective: Vital Signs: BP 102/62 (BP Location: Left Arm, Patient Position: Sitting, Cuff Size: Normal)   Pulse 94   Resp 12   Ht 5\' 6"  (1.676 m)   Wt 130 lb (59 kg)   LMP 08/14/2018   BMI 20.98 kg/m    Physical Exam  Constitutional: She is oriented to person, place, and time. She appears well-developed and well-nourished.  HENT:  Head: Normocephalic and atraumatic.  Eyes: Conjunctivae and EOM are normal.  Neck: Normal range of motion.  Cardiovascular: Normal rate, regular rhythm, normal heart sounds and intact distal pulses.  Pulmonary/Chest: Effort normal and breath sounds normal.  Abdominal: Soft. Bowel sounds are normal.  Lymphadenopathy:    She has no cervical adenopathy.  Neurological: She is alert and oriented to person, place, and time.  Skin: Skin is warm and dry. Capillary refill takes less than 2 seconds.  Psychiatric: She has a normal mood and affect. Her behavior is normal.  Nursing note and vitals reviewed.    Musculoskeletal Exam: C-spine, thoracic spine, and lumbar spine good ROM. No midline spinal tenderness.  No SI joint tenderness.  Shoulder joints and elbow joints good ROM with no synovitis.  Right wrist tenderness and swelling.  MCPs, PIPs, and DIPs good ROM. Complete fist formation.  Left 2nd PIP joint tenderness and synovitis.  Hip joints and knee joints good ROM with no synovitis. No right ankle tenderness or swelling.  Left foot in boot.  No warmth or effusion of knee joints.  Bilateral knee crepitus.   CDAI Exam: CDAI Score: Not documented Patient Global Assessment: Not documented; Provider Global Assessment: Not documented Swollen: 3 ; Tender: 3  Joint Exam      Right  Left  Wrist  Swollen Tender     PIP 2     Swollen Tender  PIP 3  Swollen Tender        Investigation: No additional findings.  Imaging: Dg Foot Complete Left  Result Date: 08/24/2018 CLINICAL DATA:  Patient stepped off of a  platform. Left lateral foot pain. EXAM: LEFT FOOT - COMPLETE 3+ VIEW COMPARISON:  None. FINDINGS: There is a minimally displaced transverse base of the left fifth metatarsal shaft fracture, with associated soft tissue swelling. The osseous mineralization is normal.  No significant arthropathy. IMPRESSION: Minimally displaced transverse fracture of the base of the left fifth metatarsal shaft. Electronically Signed   By: Ted Mcalpine M.D.   On: 08/24/2018 10:16    Recent Labs: Lab Results  Component Value Date   WBC 5.2 05/21/2018   HGB 12.6 05/21/2018   PLT 208 05/21/2018   NA 140 05/21/2018   K 4.0 05/21/2018   CL 105 05/21/2018   CO2 21 05/21/2018   GLUCOSE 95 05/21/2018   BUN 13 05/21/2018   CREATININE 0.52 (L) 05/21/2018   BILITOT 0.6 05/21/2018   ALKPHOS 75 05/21/2018   AST 14 05/21/2018   ALT 7 05/21/2018   PROT 6.7 05/21/2018   ALBUMIN 4.5 05/21/2018   CALCIUM 8.6 (L) 05/21/2018   GFRAA 140 05/21/2018   QFTBGOLD NEGATIVE 08/28/2017    Speciality Comments: TB Gold: 10/04/2016 Negative   Procedures:  Medium  Joint Inj: R intercarpal on 08/26/2018 11:24 AM Indications: pain and joint swelling Details: 27 G 1.5 in needle, ultrasound-guided dorsal approach Medications: 1 mL lidocaine 1 %; 30 mg triamcinolone acetonide 40 MG/ML Aspirate: 0 mL Outcome: tolerated well, no immediate complications Procedure, treatment alternatives, risks and benefits explained, specific risks discussed. Consent was given by the patient. Immediately prior to procedure a time out was called to verify the correct patient, procedure, equipment, support staff and site/side marked as required. Patient was prepped and draped in the usual sterile fashion.   Small Joint Inj: L index PIP on 08/26/2018 11:38 AM Indications: pain and joint swelling Details: 27 G needle, ultrasound-guided dorsal approach  Spinal Needle: No  Medications: 0.5 mL lidocaine 1 %; 10 mg triamcinolone acetonide 40  MG/ML Aspirate: 0 mL Outcome: tolerated well, no immediate complications Procedure, treatment alternatives, risks and benefits explained, specific risks discussed. Consent was given by the patient. Immediately prior to procedure a time out was called to verify the correct patient, procedure, equipment, support staff and site/side marked as required. Patient was prepped and draped in the usual sterile fashion.     Allergies: Patient has no known allergies.    Assessment / Plan:     Visit Diagnoses: Rheumatoid arthritis with rheumatoid factor of multiple sites without organ or systems involvement (HCC) - Positive RF, positive anti-CCP, erosive disease: She has active synovitis as described above. She presented today for a left 2nd PIP joint cortisone injection.  She also is experiencing bilateral wrist pain, which she attributes to using crutches following a left 5th Metatarsal shaft fracture on 08/24/18.  She has tenderness and synovitis of the right wrist joint. She requested a right wrist cortisone injection today, but we explained the risks of delayed healing of the fracture with steroid use.  She would like to proceed with a right wrist and left 2nd PIP joint injection today.  She tolerated the procedure well. She continues to take Arava 20 mg by mouth daily and IV Orencia infusions every 1 month. She will follow up for routine office visit in 5 months.  She was advised to notify us if she starts having increased joint pain or joint swelling.   High risk medication use -  IV Orencia and Arava 20 mg po daily (methotrexate-headaches, inadequate response to Humira, Simponi, subcu Orencia). CBC, CMP, and TB gold will be drawn today.  - Plan: CBC with Differential/Platelet, COMPLETE METABOLIC PANEL WITH GFR, QuantiFERON-TB Gold Plus  Contracture of left elbow - She has no tenderness or synovitis at this time.  She had a left elbow cortisone injection on 02/12/2018.  Sicca syndrome, unspecified (HCC):  She has no sicca symptoms at this time. No parotid swelling.  Trochanteric bursitis of both hips: No tenderness of trochanteric bursa bilaterally.   Pain of right sternoclavicular joint: She has no tenderness at this time.   Closed displaced fracture of fifth metatarsal bone of left foot with routine healing, subsequent encounter: She rolled her left foot and stepped off a platform on 08/24/18.  She was evaluated in the ED on 08/24/18 and had a XR that revealed a minimally displaced transverse fracture of the base of the left fifth metatarsal shaft. She was placed in a posterior splint and provided crutches.  She has been taking Ibuprofen for pain relief.  She has not been evaluated by an orthopedist yet.   Pain in right wrist: She has tenderness and synovitis of the right wrist.  She requested an ultrasound guided  cortisone injection today.  She tolerated the procedure well.  Potential side effects were discussed.  Procedure note complete above.   Orders: Orders Placed This Encounter  Procedures  . Medium Joint Inj  . Hand/UE Inj  . CBC with Differential/Platelet  . COMPLETE METABOLIC PANEL WITH GFR  . QuantiFERON-TB Gold Plus   No orders of the defined types were placed in this encounter.   Face-to-face time spent with patient was 30 minutes. Greater than 50% of time was spent in counseling and coordination of care.  Follow-Up Instructions: Return in about 5 months (around 01/25/2019) for Rheumatoid arthritis.   Gearldine Bienenstock, PA-C   I examined and evaluated the patient with Sherron Ales PA.  It had left fifth metatarsal base avulsion fracture.  I reviewed the x-rays with Dr. Cleophas Dunker today.  She has been using a boot and also using crutches which is making her symptoms worse in her hands.  He was in agreement that crutches are unnecessary and she can just continue with boot.  She is not having any discomfort in her foot while she is walking in the boot.  She had swelling of her left  second PIP joint and right wrist joint due to use of crutches.  After informed consent was obtained both joints were injected as described above.  The plan of care was discussed as noted above.  Pollyann Savoy, MD Note - This record has been created using Animal nutritionist.  Chart creation errors have been sought, but may not always  have been located. Such creation errors do not reflect on  the standard of medical care.

## 2018-08-24 ENCOUNTER — Emergency Department (HOSPITAL_BASED_OUTPATIENT_CLINIC_OR_DEPARTMENT_OTHER): Payer: BLUE CROSS/BLUE SHIELD

## 2018-08-24 ENCOUNTER — Encounter (HOSPITAL_BASED_OUTPATIENT_CLINIC_OR_DEPARTMENT_OTHER): Payer: Self-pay | Admitting: Emergency Medicine

## 2018-08-24 ENCOUNTER — Emergency Department (HOSPITAL_BASED_OUTPATIENT_CLINIC_OR_DEPARTMENT_OTHER)
Admission: EM | Admit: 2018-08-24 | Discharge: 2018-08-24 | Disposition: A | Discharge status: Home / Self Care | Payer: BLUE CROSS/BLUE SHIELD | Attending: Emergency Medicine | Admitting: Emergency Medicine

## 2018-08-24 ENCOUNTER — Other Ambulatory Visit: Payer: Self-pay

## 2018-08-24 DIAGNOSIS — M3500 Sicca syndrome, unspecified: Secondary | ICD-10-CM | POA: Insufficient documentation

## 2018-08-24 DIAGNOSIS — S92352A Displaced fracture of fifth metatarsal bone, left foot, initial encounter for closed fracture: Secondary | ICD-10-CM | POA: Insufficient documentation

## 2018-08-24 DIAGNOSIS — Y999 Unspecified external cause status: Secondary | ICD-10-CM | POA: Diagnosis not present

## 2018-08-24 DIAGNOSIS — Y9389 Activity, other specified: Secondary | ICD-10-CM | POA: Diagnosis not present

## 2018-08-24 DIAGNOSIS — Y9289 Other specified places as the place of occurrence of the external cause: Secondary | ICD-10-CM | POA: Diagnosis not present

## 2018-08-24 DIAGNOSIS — Z87891 Personal history of nicotine dependence: Secondary | ICD-10-CM | POA: Diagnosis not present

## 2018-08-24 DIAGNOSIS — M069 Rheumatoid arthritis, unspecified: Secondary | ICD-10-CM | POA: Insufficient documentation

## 2018-08-24 DIAGNOSIS — W19XXXA Unspecified fall, initial encounter: Secondary | ICD-10-CM | POA: Insufficient documentation

## 2018-08-24 DIAGNOSIS — S99922A Unspecified injury of left foot, initial encounter: Secondary | ICD-10-CM | POA: Diagnosis present

## 2018-08-24 MED ORDER — ACETAMINOPHEN 500 MG PO TABS
1000.0000 mg | ORAL_TABLET | Freq: Once | ORAL | Status: AC
  Administered 2018-08-24: 1000 mg via ORAL
  Filled 2018-08-24: qty 2

## 2018-08-24 MED ORDER — IBUPROFEN 400 MG PO TABS
400.0000 mg | ORAL_TABLET | Freq: Once | ORAL | Status: AC
  Administered 2018-08-24: 400 mg via ORAL
  Filled 2018-08-24: qty 1

## 2018-08-24 NOTE — ED Triage Notes (Signed)
L ankle pain after stepping off of a platform a foot and a half high.

## 2018-08-24 NOTE — ED Provider Notes (Signed)
[facility] EMERGENCY DEPARTMENT Provider Note   CSN: [id number] Arrival date & time: 08/24/18  0934     History   Chief Complaint Chief Complaint  Patient presents with  . Foot Pain    HPI Dawn [name] is a 37 y.o. female.  Patient c/o twisting injury to left foot this AM. C/o pain to lateral aspect foot. Pain dull, moderate, constant, acute onset, worse w palpation. Skin is intact. No ankle pain. Denies any other pain or injury. Was mechanical fall, no faintness or dizziness.   The history is provided by the patient.  Foot Pain     Past Medical History:  Diagnosis Date  . Arthritis   . H/O laparoscopy 05/05/13  . History of ectopic pregnancy 05/05/13  . Hx of unilateral salpingectomy 05/05/13   Left  . Hx of wisdom tooth extraction   . Medical history non-contributory     Patient Active Problem List   Diagnosis Date Noted  . Sicca syndrome, unspecified (HCC) 02/21/2017  . High risk medication use 01/03/2017  . Rheumatoid arthritis with rheumatoid factor of multiple sites without organ or systems involvement (HCC) 06/26/2016  . Status post induction of labor 01/13/2015  . Term pregnancy 01/13/2015  . Spontaneous vaginal delivery 01/13/2015    Past Surgical History:  Procedure Laterality Date  . LAPAROSCOPY N/A 05/06/2013   Procedure: LAPAROSCOPY OPERATIVE, Left Salpingectomy, Removal Ectopic Pregnancy;  Surgeon: Mickel Baas, MD;  Location: [facility];  Service: Gynecology;  Laterality: N/A;  . WISDOM TOOTH EXTRACTION       OB History    Gravida  4   Para  3   Term  3   Preterm      AB      Living  3     SAB      TAB      Ectopic      Multiple  0   Live Births  3            Home Medications    Prior to Admission medications   Medication Sig Start Date End Date Taking? Authorizing Provider  Abatacept (ORENCIA IV) Inject into the vein.    [provider]  leflunomide (ARAVA) 20 MG tablet TAKE 1 TABLET BY MOUTH  EVERY DAY 06/18/18   Pollyann Savoy, MD    Family History Family History  Problem Relation Age of Onset  . Psoriasis Sister     Social History Social History   Tobacco Use  . Smoking status: Former Smoker    Types: Cigarettes  . Smokeless tobacco: Never Used  Substance Use Topics  . Alcohol use: No  . Drug use: No     Allergies   Patient has no known allergies.   Review of Systems Review of Systems  Constitutional: Negative for fever.  Musculoskeletal: Negative for back pain and neck pain.  Skin: Negative for wound.  Neurological: Negative for weakness and numbness.     Physical Exam Updated Vital Signs BP 103/71 (BP Location: Left Arm)   Pulse 84   Temp 98.1 F (36.7 C) (Oral)   Resp 16   Ht 1.676 m (5\' 6" )   Wt 59 kg   LMP 08/14/2018   SpO2 99%   BMI 20.98 kg/m   Physical Exam  Constitutional: She appears well-developed and well-nourished.  HENT:  Head: Atraumatic.  Eyes: Conjunctivae are normal. No scleral icterus.  Neck: Neck supple. No tracheal deviation present.  Cardiovascular: Normal rate and intact distal  pulses.  Pulmonary/Chest: Effort normal. No respiratory distress.  Abdominal: Normal appearance.  Musculoskeletal:  sts and tenderness over proximal 5th metatarsal left foot. Dp/pt 2+. No ankle/malleolar tenderness. Ankle stable.   Neurological: She is alert.  Motor/sens grossly intact left foot/toes.   Skin: Skin is warm and dry. No rash noted.  Psychiatric: She has a normal mood and affect.  Nursing note and vitals reviewed.    ED Treatments / Results  Labs (all labs ordered are listed, but only abnormal results are displayed) Labs Reviewed - No data to display  EKG None  Radiology Dg Foot Complete Left  Result Date: 08/24/2018 CLINICAL DATA:  Patient stepped off of a platform. Left lateral foot pain. EXAM: LEFT FOOT - COMPLETE 3+ VIEW COMPARISON:  None. FINDINGS: There is a minimally displaced transverse base of the left  fifth metatarsal shaft fracture, with associated soft tissue swelling. The osseous mineralization is normal.  No significant arthropathy. IMPRESSION: Minimally displaced transverse fracture of the base of the left fifth metatarsal shaft. Electronically Signed   By: Ted Mcalpine M.D.   On: 08/24/2018 10:16    Procedures Procedures (including critical care time)  Medications Ordered in ED Medications - No data to display   Initial Impression / Assessment and Plan / ED Course  I have reviewed the triage vital signs and the nursing notes.  Pertinent labs & imaging results that were available during my care of the patient were reviewed by me and considered in my medical decision making (see chart for details).  Xrays ordered.   Elevated foot. Icepack.  Reviewed nursing notes and prior charts for additional history.   xrays reviewed - proximal 5th mt fx - discussed w pt.   Pt indicates she already has an appt at [facility] orthopedics this Monday - she will have them look at her mt fx then.   Splint/posterior splint, crutches.   Motrin po. Acetaminophen po.   Final Clinical Impressions(s) / ED Diagnoses   Final diagnoses:  None    ED Discharge Orders    None       Cathren Laine, MD 08/24/18 1036

## 2018-08-24 NOTE — Discharge Instructions (Addendum)
It was our pleasure to provide your ER care today - we hope that you feel better.  Wear brace/support, use crutches, elevate foot. No weight bearing on foot until seen by orthopedist this week. Follow up with [facility] this Monday as planned.   Take acetaminophen and/or ibuprofen as need for pain.

## 2018-08-26 ENCOUNTER — Ambulatory Visit: Payer: BLUE CROSS/BLUE SHIELD | Admitting: Rheumatology

## 2018-08-26 ENCOUNTER — Encounter: Payer: Self-pay | Admitting: Physician Assistant

## 2018-08-26 VITALS — BP 102/62 | HR 94 | Resp 12 | Ht 66.0 in | Wt 130.0 lb

## 2018-08-26 DIAGNOSIS — M24522 Contracture, left elbow: Secondary | ICD-10-CM | POA: Diagnosis not present

## 2018-08-26 DIAGNOSIS — M3500 Sicca syndrome, unspecified: Secondary | ICD-10-CM | POA: Diagnosis not present

## 2018-08-26 DIAGNOSIS — S92352D Displaced fracture of fifth metatarsal bone, left foot, subsequent encounter for fracture with routine healing: Secondary | ICD-10-CM

## 2018-08-26 DIAGNOSIS — M79642 Pain in left hand: Secondary | ICD-10-CM | POA: Diagnosis not present

## 2018-08-26 DIAGNOSIS — Z79899 Other long term (current) drug therapy: Secondary | ICD-10-CM | POA: Diagnosis not present

## 2018-08-26 DIAGNOSIS — M7061 Trochanteric bursitis, right hip: Secondary | ICD-10-CM

## 2018-08-26 DIAGNOSIS — M0579 Rheumatoid arthritis with rheumatoid factor of multiple sites without organ or systems involvement: Secondary | ICD-10-CM | POA: Diagnosis not present

## 2018-08-26 DIAGNOSIS — M7062 Trochanteric bursitis, left hip: Secondary | ICD-10-CM

## 2018-08-26 DIAGNOSIS — M25531 Pain in right wrist: Secondary | ICD-10-CM

## 2018-08-26 DIAGNOSIS — M25511 Pain in right shoulder: Secondary | ICD-10-CM

## 2018-08-26 MED ORDER — TRIAMCINOLONE ACETONIDE 40 MG/ML IJ SUSP
30.0000 mg | INTRAMUSCULAR | Status: AC | PRN
  Administered 2018-08-26: 30 mg via INTRA_ARTICULAR

## 2018-08-26 MED ORDER — LIDOCAINE HCL 1 % IJ SOLN
0.5000 mL | INTRAMUSCULAR | Status: AC | PRN
  Administered 2018-08-26: .5 mL

## 2018-08-26 MED ORDER — LIDOCAINE HCL 1 % IJ SOLN
1.0000 mL | INTRAMUSCULAR | Status: AC | PRN
  Administered 2018-08-26: 1 mL

## 2018-08-26 MED ORDER — TRIAMCINOLONE ACETONIDE 40 MG/ML IJ SUSP
10.0000 mg | INTRAMUSCULAR | Status: AC | PRN
  Administered 2018-08-26: 10 mg via INTRA_ARTICULAR

## 2018-08-27 NOTE — Progress Notes (Signed)
Potassium borderline elevated.  Please advise to avoid taking any potassium supplements. All other labs are WNL

## 2018-08-28 ENCOUNTER — Ambulatory Visit (INDEPENDENT_AMBULATORY_CARE_PROVIDER_SITE_OTHER): Payer: Self-pay

## 2018-08-28 ENCOUNTER — Ambulatory Visit (INDEPENDENT_AMBULATORY_CARE_PROVIDER_SITE_OTHER): Payer: BLUE CROSS/BLUE SHIELD | Admitting: Orthopedic Surgery

## 2018-08-28 ENCOUNTER — Encounter (INDEPENDENT_AMBULATORY_CARE_PROVIDER_SITE_OTHER): Payer: Self-pay | Admitting: Orthopedic Surgery

## 2018-08-28 VITALS — BP 80/57 | HR 79 | Resp 14 | Ht 66.0 in | Wt 130.0 lb

## 2018-08-28 DIAGNOSIS — S92355A Nondisplaced fracture of fifth metatarsal bone, left foot, initial encounter for closed fracture: Secondary | ICD-10-CM

## 2018-08-28 LAB — QUANTIFERON-TB GOLD PLUS
Mitogen-NIL: >10 IU/mL
NIL: 0.01 [IU]/mL
QuantiFERON-TB Gold Plus: NEGATIVE
TB1-NIL: 0 IU/mL
TB2-NIL: 0 IU/mL

## 2018-08-28 LAB — COMPLETE METABOLIC PANEL WITH GFR
AG Ratio: 1.5 (calc) (ref 1.0–2.5)
ALKALINE PHOSPHATASE (APISO): 80 U/L (ref 33–115)
ALT: 7 U/L (ref 6–29)
AST: 13 U/L (ref 10–30)
Albumin: 4.2 g/dL (ref 3.6–5.1)
BUN: 11 mg/dL (ref 7–25)
CALCIUM: 9.6 mg/dL (ref 8.6–10.2)
CO2: 29 mmol/L (ref 20–32)
CREATININE: 0.64 mg/dL (ref 0.50–1.10)
Chloride: 108 mmol/L (ref 98–110)
GFR, EST NON AFRICAN AMERICAN: 113 mL/min/{1.73_m2} (ref >=60)
GFR, Est African American: 131 mL/min/{1.73_m2} (ref >=60)
GLOBULIN: 2.8 g/dL (ref 1.9–3.7)
GLUCOSE: 87 mg/dL (ref 65–99)
Potassium: 5.4 mmol/L — ABNORMAL HIGH (ref 3.5–5.3)
SODIUM: 142 mmol/L (ref 135–146)
Total Bilirubin: 0.5 mg/dL (ref 0.2–1.2)
Total Protein: 7 g/dL (ref 6.1–8.1)

## 2018-08-28 LAB — CBC WITH DIFFERENTIAL/PLATELET
BASOS ABS: 41 {cells}/uL (ref 0–200)
Basophils Relative: 0.7 %
EOS PCT: 6 %
Eosinophils Absolute: 354 cells/uL (ref 15–500)
HCT: 38.3 % (ref 35.0–45.0)
Hemoglobin: 12.9 g/dL (ref 11.7–15.5)
LYMPHS ABS: 1811 {cells}/uL (ref 850–3900)
MCH: 31.2 pg (ref 27.0–33.0)
MCHC: 33.7 g/dL (ref 32.0–36.0)
MCV: 92.5 fL (ref 80.0–100.0)
MONOS PCT: 7 %
MPV: 9.7 fL (ref 7.5–12.5)
NEUTROS ABS: 3280 {cells}/uL (ref 1500–7800)
Neutrophils Relative %: 55.6 %
PLATELETS: 263 10*3/uL (ref 140–400)
RBC: 4.14 10*6/uL (ref 3.80–5.10)
RDW: 10.9 % — AB (ref 11.0–15.0)
Total Lymphocyte: 30.7 %
WBC mixed population: 413 cells/uL (ref 200–950)
WBC: 5.9 10*3/uL (ref 3.8–10.8)

## 2018-08-28 NOTE — Progress Notes (Signed)
Office Visit Note   Patient: Dawn [name]           Date of Birth: Jul 22, 1981           MRN: [id number] Visit Date: 08/28/2018              Requested by: [facility] [address] [place], [place] [address] PCP: Don Broach, [facility]   Assessment & Plan: Visit Diagnoses:  1. Closed nondisplaced fracture of fifth metatarsal bone of left foot, initial encounter     Plan:  #1: Continue with the equalizer boot pretty much full-time #2: Follow back up in 1 week for recheck x-ray to make sure there is no displacement.  We had discussed of possible screw fixation if necessary depending upon the position.  Follow-Up Instructions: Return in about 1 week (around 09/04/2018).   Orders:  Orders Placed This Encounter  Procedures  . XR Foot Complete Left   No orders of the defined types were placed in this encounter.     Procedures: No procedures performed   Clinical Data: No additional findings.   Subjective: Chief Complaint  Patient presents with  . Left Foot - Pain  . 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  She was seen in the emergency room strays obtained revealing a normally displaced left proximal fifth metatarsal fracture.  She is placed into an equalizer boot as well as crutches.  She saw Dr. Link Snuffer on November 4 and was noted to be having exacerbation of pain in the upper extremity secondary to her crutches.  Films were evaluated by Dr. Cleophas Dunker and the crutches were discontinued.  She still continues  to have pain at the lateral foot.  She is walking without any assistive devices.  Review of Systems  Constitutional: Negative for fatigue.  HENT: Negative for trouble swallowing.   Eyes: Negative for pain.  Respiratory: Negative for shortness of breath.   Cardiovascular: Negative for leg swelling.  Gastrointestinal: Negative for constipation.  Endocrine: Negative for cold intolerance.  Genitourinary: Negative for difficulty urinating.  Musculoskeletal: Negative for gait problem.  Skin: Negative for rash.  Allergic/Immunologic: Negative for food allergies.  Neurological: Negative for weakness.  Hematological: Does not bruise/bleed easily.  Psychiatric/Behavioral: Negative for sleep disturbance.     Objective: Vital Signs: BP (!) 80/57 (BP Location: Left Arm, Patient Position: Sitting, Cuff Size: Normal)   Pulse 79   Resp 14   Ht 5\' 6"  (1.676 m)   Wt 130 lb (59 kg)   LMP 08/14/2018   BMI 20.98 kg/m   Physical Exam  Constitutional: She is oriented to person, place, and time. She appears well-developed and well-nourished.  HENT:  Mouth/Throat: Oropharynx is clear and moist.  Eyes: Pupils are equal, round, and reactive to light. EOM are normal.  Pulmonary/Chest: Effort normal.  Neurological: She is alert and oriented to person, place, and time.  Skin: Skin is warm and dry.  Psychiatric: She has a normal mood and affect. Her behavior is normal.  Ortho Exam  She has ecchymosis about the lateral aspect of the foot.  Tender exquisitely over the proximal fifth metatarsal.  Good motion of the toes and ankle.  Neurovascular intact distally.  No pain otherwise in the foot.  Specialty Comments:  No specialty comments available.  Imaging: Xr Foot Complete Left  Result Date: 08/28/2018 Three-view x-ray of the left foot reveals maintenance of position alignment of the fracture.  On the AP there is a millimeters of the proximal fragment to be slight lateral to the metatarsal shaft.  Essentially unchanged from her original film.    PMFS History: Current Outpatient Medications  Medication Sig Dispense Refill  . Abatacept (ORENCIA IV) Inject into the vein.    Marland Kitchen leflunomide (ARAVA) 20 MG tablet TAKE 1 TABLET BY MOUTH EVERY DAY 30 tablet 2   No current facility-administered medications for this visit.     Patient Active Problem List   Diagnosis Date Noted  . Sicca syndrome, unspecified (HCC) 02/21/2017  . High risk medication use 01/03/2017  . Rheumatoid arthritis with rheumatoid factor of multiple sites without organ or systems involvement (HCC) 06/26/2016  . Status post induction of labor 01/13/2015  . Term pregnancy 01/13/2015  . Spontaneous vaginal delivery 01/13/2015   Past Medical History:  Diagnosis Date  . Arthritis   . H/O laparoscopy 05/05/13  . History of ectopic pregnancy 05/05/13  . Hx of unilateral salpingectomy 05/05/13   Left  . Hx of wisdom tooth extraction   . Medical history non-contributory     Family History  Problem Relation Age of Onset  . Psoriasis Sister     Past Surgical History:  Procedure Laterality Date  . LAPAROSCOPY N/A 05/06/2013   Procedure: LAPAROSCOPY OPERATIVE, Left Salpingectomy, Removal Ectopic Pregnancy;  Surgeon: Mickel Baas, MD;  Location: [facility];  Service: Gynecology;  Laterality: N/A;  . WISDOM TOOTH EXTRACTION     Social History   Occupational History  . Not on file  Tobacco Use  . Smoking status: Former Smoker    Packs/day: 0.50    Types: Cigarettes    Last attempt to quit: 08/28/2001    Years since quitting: 17.0  . Smokeless tobacco: Never Used  Substance and Sexual Activity  . Alcohol use: No  . Drug use: No  . Sexual activity: Yes    Birth control/protection: None

## 2018-08-28 NOTE — Progress Notes (Signed)
TB gold negative

## 2018-09-05 ENCOUNTER — Ambulatory Visit (INDEPENDENT_AMBULATORY_CARE_PROVIDER_SITE_OTHER): Payer: BLUE CROSS/BLUE SHIELD | Admitting: Orthopaedic Surgery

## 2018-09-05 ENCOUNTER — Encounter (INDEPENDENT_AMBULATORY_CARE_PROVIDER_SITE_OTHER): Payer: Self-pay | Admitting: Orthopaedic Surgery

## 2018-09-05 ENCOUNTER — Ambulatory Visit (INDEPENDENT_AMBULATORY_CARE_PROVIDER_SITE_OTHER): Payer: Self-pay

## 2018-09-05 VITALS — Resp 14 | Ht 66.0 in | Wt 130.0 lb

## 2018-09-05 DIAGNOSIS — M79672 Pain in left foot: Secondary | ICD-10-CM | POA: Diagnosis not present

## 2018-09-05 NOTE — Progress Notes (Signed)
Office Visit Note   Patient: Remi Rester           Date of Birth: 16-Jul-1981           MRN: [id number] Visit Date: 09/05/2018              Requested by: [facility] [address] [place], [place] [address] PCP: Don Broach, [facility]   Assessment & Plan: Visit Diagnoses:  1. Pain in left foot     Plan: Nondisplaced fracture base of left fifth metatarsal x2 weeks.  Presently doing well equalizer boot.  Will try ASO support to help sleep at night. office 3 to 4 weeks  Follow-Up Instructions: Return in about 1 month (around 10/05/2018).   Orders:  Orders Placed This Encounter  Procedures  . XR Foot Complete Left   No orders of the defined types were placed in this encounter.     Procedures: No procedures performed   Clinical Data: No additional findings.   Subjective: Chief Complaint  Patient presents with  . Left Foot - Follow-up  . Follow-up    Left foot fracture, improving    HPI  Review of Systems  Constitutional: Negative for fatigue.  HENT: Negative for trouble swallowing.   Eyes: Negative for pain.  Respiratory: Negative for shortness of breath.   Cardiovascular: Negative for chest pain.  Gastrointestinal: Negative for constipation.  Endocrine: Negative for cold intolerance.  Genitourinary: Negative for difficulty urinating.  Musculoskeletal: Positive for joint swelling.  Skin: Negative for rash.  Allergic/Immunologic: Negative for food allergies.  Neurological: Negative for weakness.  Hematological: Bruises/bleeds easily.  Psychiatric/Behavioral: Negative for sleep disturbance.     Objective: Vital Signs: Resp 14   Ht 5\' 6"  (1.676 m)   Wt 130 lb (59 kg)   LMP 08/14/2018   BMI 20.98 kg/m   Physical Exam  Ortho Exam left foot exam with resolving areas of ecchymosis about her heel midfoot and forefoot.  No significant swelling.  Neurovascular exam intact.  Good capillary refill to the toes. Specialty Comments:    No specialty comments available.  Imaging: Xr Foot Complete Left  Result Date: 09/05/2018 Films of the left foot were obtained in several projections and compared to those that were performed last week.  There is no change in the position of the avulsion fracture of the base of the fifth metatarsal.  Patient is feeling better    PMFS History: Patient Active Problem List   Diagnosis Date Noted  . Sicca syndrome, unspecified (HCC) 02/21/2017  . High risk medication use 01/03/2017  . Rheumatoid arthritis with rheumatoid factor of multiple sites without organ or systems involvement (HCC) 06/26/2016  . Status post induction of labor 01/13/2015  . Term pregnancy 01/13/2015  . Spontaneous vaginal delivery 01/13/2015   Past Medical History:  Diagnosis Date  . Arthritis   . H/O laparoscopy 05/05/13  . History of ectopic pregnancy 05/05/13  . Hx of unilateral salpingectomy 05/05/13   Left  . Hx of wisdom tooth extraction   . Medical history non-contributory     Family History  Problem Relation Age of Onset  . Psoriasis Sister     Past Surgical History:  Procedure Laterality Date  . LAPAROSCOPY N/A 05/06/2013   Procedure: LAPAROSCOPY OPERATIVE, Left Salpingectomy, Removal Ectopic Pregnancy;  Surgeon: 05/08/2013, MD;  Location: [facility];  Service: Gynecology;  Laterality: N/A;  . WISDOM TOOTH EXTRACTION     Social History   Occupational History  .  Not on file  Tobacco Use  . Smoking status: Former Smoker    Packs/day: 0.50    Types: Cigarettes    Last attempt to quit: 08/28/2001    Years since quitting: 17.0  . Smokeless tobacco: Never Used  Substance and Sexual Activity  . Alcohol use: No  . Drug use: No  . Sexual activity: Yes    Birth control/protection: None

## 2018-09-06 ENCOUNTER — Ambulatory Visit (INDEPENDENT_AMBULATORY_CARE_PROVIDER_SITE_OTHER): Payer: BLUE CROSS/BLUE SHIELD | Admitting: Orthopaedic Surgery

## 2018-09-17 ENCOUNTER — Other Ambulatory Visit: Payer: Self-pay | Admitting: Neurology

## 2018-09-17 DIAGNOSIS — M0579 Rheumatoid arthritis with rheumatoid factor of multiple sites without organ or systems involvement: Secondary | ICD-10-CM

## 2018-09-17 NOTE — Addendum Note (Signed)
Addended by: Tamera Stands D on: 09/17/2018 10:58 AM   Modules accepted: Orders

## 2018-09-18 ENCOUNTER — Telehealth: Payer: Self-pay | Admitting: *Deleted

## 2018-09-18 LAB — COMPREHENSIVE METABOLIC PANEL
A/G RATIO: 2.1 (ref 1.2–2.2)
ALT: 7 IU/L (ref 0–32)
AST: 12 IU/L (ref 0–40)
Albumin: 4.5 g/dL (ref 3.5–5.5)
Alkaline Phosphatase: 81 IU/L (ref 39–117)
BILIRUBIN TOTAL: 0.3 mg/dL (ref 0.0–1.2)
BUN/Creatinine Ratio: 15 (ref 9–23)
BUN: 9 mg/dL (ref 6–20)
CHLORIDE: 104 mmol/L (ref 96–106)
CO2: 22 mmol/L (ref 20–29)
Calcium: 9.2 mg/dL (ref 8.7–10.2)
Creatinine, Ser: 0.61 mg/dL (ref 0.57–1.00)
GFR calc Af Amer: 133 mL/min/{1.73_m2} (ref >59)
GFR calc non Af Amer: 115 mL/min/{1.73_m2} (ref >59)
Globulin, Total: 2.1 g/dL (ref 1.5–4.5)
Glucose: 101 mg/dL — ABNORMAL HIGH (ref 65–99)
POTASSIUM: 3.9 mmol/L (ref 3.5–5.2)
Sodium: 141 mmol/L (ref 134–144)
Total Protein: 6.6 g/dL (ref 6.0–8.5)

## 2018-09-18 LAB — CBC WITH DIFFERENTIAL/PLATELET
Basophils Absolute: 0 10*3/uL (ref 0.0–0.2)
Basos: 1 %
EOS (ABSOLUTE): 0.2 10*3/uL (ref 0.0–0.4)
Eos: 5 %
HEMATOCRIT: 38.1 % (ref 34.0–46.6)
Hemoglobin: 12.5 g/dL (ref 11.1–15.9)
Immature Grans (Abs): 0 10*3/uL (ref 0.0–0.1)
Immature Granulocytes: 0 %
LYMPHS ABS: 1.4 10*3/uL (ref 0.7–3.1)
Lymphs: 32 %
MCH: 30.4 pg (ref 26.6–33.0)
MCHC: 32.8 g/dL (ref 31.5–35.7)
MCV: 93 fL (ref 79–97)
Monocytes Absolute: 0.3 10*3/uL (ref 0.1–0.9)
Monocytes: 8 %
Neutrophils Absolute: 2.3 10*3/uL (ref 1.4–7.0)
Neutrophils: 54 %
PLATELETS: 236 10*3/uL (ref 150–450)
RBC: 4.11 x10E6/uL (ref 3.77–5.28)
RDW: 11.3 % — AB (ref 12.3–15.4)
WBC: 4.2 10*3/uL (ref 3.4–10.8)

## 2018-09-18 NOTE — Telephone Encounter (Signed)
Called and spoke with pt about lab work. She verbalized understanding.

## 2018-09-18 NOTE — Telephone Encounter (Signed)
-----   Message from Asa Lente, MD sent at 09/18/2018  8:29 AM EST ----- Please let the patient know that the lab work is fine.

## 2018-10-04 ENCOUNTER — Ambulatory Visit (INDEPENDENT_AMBULATORY_CARE_PROVIDER_SITE_OTHER): Payer: BLUE CROSS/BLUE SHIELD | Admitting: Orthopaedic Surgery

## 2018-10-04 ENCOUNTER — Encounter (INDEPENDENT_AMBULATORY_CARE_PROVIDER_SITE_OTHER): Payer: Self-pay | Admitting: Orthopaedic Surgery

## 2018-10-04 VITALS — BP 80/63 | HR 88 | Ht 66.0 in | Wt 130.0 lb

## 2018-10-04 DIAGNOSIS — M79672 Pain in left foot: Secondary | ICD-10-CM

## 2018-10-04 NOTE — Progress Notes (Signed)
Office Visit Note   Patient: Isobel Eisenhuth           Date of Birth: 07-01-1981           MRN: [id number] Visit Date: 10/04/2018              Requested by: [facility] [address] [place], [place] [address] PCP: Don Broach, [facility]   Assessment & Plan: Visit Diagnoses:  1. Left foot pain     Plan: 6 weeks status post nondisplaced fracture of the base of the left fifth metatarsal and doing well.  Has transitioned to the ASO ankle support in a comfortable shoe.  Some discomfort after being on her feet for long periods of time.  We will plan to see her back as needed.  I think this will just continue to heal without need for further films doing well  Follow-Up Instructions: Return if symptoms worsen or fail to improve.   Orders:  No orders of the defined types were placed in this encounter.  No orders of the defined types were placed in this encounter.     Procedures: No procedures performed   Clinical Data: No additional findings.   Subjective: Chief Complaint  Patient presents with  . Left Foot - Follow-up  . Foot Pain    follow up for left pain , about to put some weight bearing on it    6 weeks status post nondisplaced fracture of the base of the left fifth metatarsal and definitely improving.  Able to bear weight now without much pain.  Presently using the ASO support in a comfortable shoe HPI  Review of Systems   Objective: Vital Signs: BP (!) 80/63   Pulse 88   Ht 5\' 6"  (1.676 m)   Wt 130 lb (59 kg)   BMI 20.98 kg/m   Physical Exam  Ortho Exam left foot with minimal tenderness at the base of the left fifth metatarsal.  No pain with inversion or eversion dorsiflexion or plantarflexion.  No pain with weightbearing.  No deformity.  Neurologically intact  Specialty Comments:  No specialty comments available.  Imaging: No results found.   PMFS History: Patient Active Problem List   Diagnosis Date Noted  . Left foot pain  10/04/2018  . Sicca syndrome, unspecified (HCC) 02/21/2017  . High risk medication use 01/03/2017  . Rheumatoid arthritis with rheumatoid factor of multiple sites without organ or systems involvement (HCC) 06/26/2016  . Status post induction of labor 01/13/2015  . Term pregnancy 01/13/2015  . Spontaneous vaginal delivery 01/13/2015   Past Medical History:  Diagnosis Date  . Arthritis   . H/O laparoscopy 05/05/13  . History of ectopic pregnancy 05/05/13  . Hx of unilateral salpingectomy 05/05/13   Left  . Hx of wisdom tooth extraction   . Medical history non-contributory     Family History  Problem Relation Age of Onset  . Psoriasis Sister     Past Surgical History:  Procedure Laterality Date  . LAPAROSCOPY N/A 05/06/2013   Procedure: LAPAROSCOPY OPERATIVE, Left Salpingectomy, Removal Ectopic Pregnancy;  Surgeon: 05/08/2013, MD;  Location: [facility];  Service: Gynecology;  Laterality: N/A;  . WISDOM TOOTH EXTRACTION     Social History   Occupational History  . Not on file  Tobacco Use  . Smoking status: Former Smoker    Packs/day: 0.50    Types: Cigarettes    Last attempt to quit: 08/28/2001    Years since  quitting: 17.1  . Smokeless tobacco: Never Used  Substance and Sexual Activity  . Alcohol use: No  . Drug use: No  . Sexual activity: Yes    Birth control/protection: None

## 2018-11-11 ENCOUNTER — Telehealth: Payer: Self-pay | Admitting: *Deleted

## 2018-11-11 NOTE — Telephone Encounter (Signed)
Received fax notification from [employer] access support that pt approved for Rheumatology IV copay assistance program for Orencia from 10/23/18-10/23/19. Pt responsible for $5 copay per infusion up to a max of 15,000 per calender year. Gave to [facility] for their records. Pt has a f/u with Dr. Epimenio Foot

## 2018-11-18 ENCOUNTER — Telehealth: Payer: Self-pay | Admitting: Rheumatology

## 2018-11-18 NOTE — Telephone Encounter (Signed)
Patient called stating she stopped taking her Arava in November due to headaches and "stomach issues."  Patient states last week she has begun experiencing more pain and would like to restart the medication.  Patient states she still has 2 refills left from her remaining prescription and requested a return call to let her know if it is okay to restart.

## 2018-11-18 NOTE — Telephone Encounter (Signed)
Attempted to contact the patient and left message for patient to call the office.  

## 2018-12-04 ENCOUNTER — Telehealth: Payer: Self-pay | Admitting: Rheumatology

## 2018-12-04 NOTE — Telephone Encounter (Signed)
Patient advised that Dr. Corliss Skainseveshwar does not recommend increasing the Orencia. Patient verbalized understanding.

## 2018-12-04 NOTE — Telephone Encounter (Signed)
Her weight is close to 130 lbs. I would not recommend changing dose of Orencia.

## 2018-12-04 NOTE — Telephone Encounter (Signed)
Patient calling to see if the dosage of her infusion needs to be increased due to her weight. She is 133 pds, and she is right on the line for an increase. Please call patient to advise. Patient is due for infusion in about two weeks.

## 2018-12-10 ENCOUNTER — Other Ambulatory Visit: Payer: Self-pay | Admitting: *Deleted

## 2018-12-10 ENCOUNTER — Other Ambulatory Visit: Payer: Self-pay

## 2018-12-10 DIAGNOSIS — Z79899 Other long term (current) drug therapy: Secondary | ICD-10-CM

## 2018-12-10 DIAGNOSIS — M0579 Rheumatoid arthritis with rheumatoid factor of multiple sites without organ or systems involvement: Secondary | ICD-10-CM

## 2018-12-12 LAB — COMPREHENSIVE METABOLIC PANEL
ALT: 7 IU/L (ref 0–32)
AST: 14 IU/L (ref 0–40)
Albumin/Globulin Ratio: 1.9 (ref 1.2–2.2)
Albumin: 4.2 g/dL (ref 3.8–4.8)
Alkaline Phosphatase: 88 IU/L (ref 39–117)
BUN/Creatinine Ratio: 19 (ref 9–23)
BUN: 11 mg/dL (ref 6–20)
Bilirubin Total: 0.3 mg/dL (ref 0.0–1.2)
CALCIUM: 8.6 mg/dL — AB (ref 8.7–10.2)
CO2: 24 mmol/L (ref 20–29)
CREATININE: 0.58 mg/dL (ref 0.57–1.00)
Chloride: 105 mmol/L (ref 96–106)
GFR calc Af Amer: 134 mL/min/{1.73_m2} (ref >59)
GFR, EST NON AFRICAN AMERICAN: 116 mL/min/{1.73_m2} (ref >59)
GLOBULIN, TOTAL: 2.2 g/dL (ref 1.5–4.5)
GLUCOSE: 85 mg/dL (ref 65–99)
Potassium: 4.4 mmol/L (ref 3.5–5.2)
SODIUM: 141 mmol/L (ref 134–144)
Total Protein: 6.4 g/dL (ref 6.0–8.5)

## 2018-12-12 LAB — CBC WITH DIFFERENTIAL/PLATELET
BASOS: 1 %
Basophils Absolute: 0 10*3/uL (ref 0.0–0.2)
EOS (ABSOLUTE): 0.3 10*3/uL (ref 0.0–0.4)
EOS: 6 %
HEMATOCRIT: 35.4 % (ref 34.0–46.6)
HEMOGLOBIN: 12 g/dL (ref 11.1–15.9)
IMMATURE GRANS (ABS): 0 10*3/uL (ref 0.0–0.1)
Immature Granulocytes: 0 %
LYMPHS: 28 %
Lymphocytes Absolute: 1.7 10*3/uL (ref 0.7–3.1)
MCH: 30.6 pg (ref 26.6–33.0)
MCHC: 33.9 g/dL (ref 31.5–35.7)
MCV: 90 fL (ref 79–97)
MONOCYTES: 8 %
Monocytes Absolute: 0.5 10*3/uL (ref 0.1–0.9)
NEUTROS PCT: 57 %
Neutrophils Absolute: 3.6 10*3/uL (ref 1.4–7.0)
Platelets: 252 10*3/uL (ref 150–450)
RBC: 3.92 x10E6/uL (ref 3.77–5.28)
RDW: 11.7 % (ref 11.7–15.4)
WBC: 6.2 10*3/uL (ref 3.4–10.8)

## 2018-12-12 LAB — QUANTIFERON-TB GOLD PLUS
QUANTIFERON TB1 AG VALUE: 0.02 [IU]/mL
QuantiFERON Nil Value: 0.02 IU/mL
QuantiFERON TB2 Ag Value: 0.02 IU/mL
QuantiFERON-TB Gold Plus: NEGATIVE

## 2018-12-16 ENCOUNTER — Ambulatory Visit: Payer: BLUE CROSS/BLUE SHIELD | Admitting: Family Medicine

## 2018-12-16 ENCOUNTER — Telehealth: Payer: Self-pay | Admitting: *Deleted

## 2018-12-16 ENCOUNTER — Encounter: Payer: Self-pay | Admitting: Family Medicine

## 2018-12-16 ENCOUNTER — Ambulatory Visit: Payer: BLUE CROSS/BLUE SHIELD | Admitting: Neurology

## 2018-12-16 VITALS — BP 96/58 | HR 80 | Ht 66.0 in | Wt 133.4 lb

## 2018-12-16 DIAGNOSIS — Z79899 Other long term (current) drug therapy: Secondary | ICD-10-CM

## 2018-12-16 DIAGNOSIS — M0579 Rheumatoid arthritis with rheumatoid factor of multiple sites without organ or systems involvement: Secondary | ICD-10-CM

## 2018-12-16 NOTE — Progress Notes (Signed)
PATIENT: Dawn [name] DOB: 1981/07/21  REASON FOR VISIT: follow up HISTORY FROM: patient  Chief Complaint  Patient presents with  . Follow-up    Yearly follow up. Daughter present. New room. No new concerns at this time.      HISTORY OF PRESENT ILLNESS: Today 12/16/18 Dawn [name] is a 38 y.o. female here today for follow up for Orencia infusions for RA diagnosed in 2016.  She reports that she is doing well with infusions.  These are given every 28 days. She has generalized joint pain but does state that it is typically worse in her feet.  She is doing well overall.  She is able to complete ADLs.  She does feel like the medication is significantly helping her symptoms.  Lab work was recently performed and normal.  She has labs drawn every 2 months.  HISTORY: (copied from Dr Bonnita Hollow note on 12/13/2017) Dawn [name] is a 38 year old woman with seropositive rheumatoid arthritis.  She was diagnosed with RA 3 years ago (was 6 weeks post-partum) and had severe wrist pain and also had foot paoin.   Tests showed a positive RF. In general her pain has been worse in the wrist, hands, toes and ankles.   She tried methotrexate x 3 and each time had severe headaches.  Humira caused injection site issues.    Orencia IV was well tolerated and beneficial.    Simponi was not effective.    Subcutaneous Orencia has not helped her nearly as much as IV.       She tolerated the IV Orencia very well.   Laboratory values were fine.   Prednisone has helped for flares.  Currently her worse pain is in the right wrist in the right ankle. She notes swelling and heat at the right wrist. She also notes deformed joints in both index fingers but not much pain currently there.  She stiffens up at night and has nocturnal joint pain. The joints are tender in the mornings and stiff x many hours.    She has reduced fine motor control due to her RA.    Gross motor function is not affected much.    She also has a sicca  syndrome.  She has not had any difficulties with neuropathy or with uveitis. Does not note much neck pain.    She has a positive rheumatoid factor and a positive anti-CCP. 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   There is no family history.  She has tried and failed methotrexate, Humira, Simponi, subcutaneous Orencia. Her rheumatoid arthritis was well controlled on IV Orencia in the past.  REVIEW OF SYSTEMS: Out of a complete 14 system review of symptoms, the patient complains only of the following symptoms, none reported and all other reviewed systems are negative.  ALLERGIES: No Known Allergies  HOME MEDICATIONS: Outpatient Medications Prior to Visit  Medication Sig Dispense Refill  . Abatacept (ORENCIA IV) Inject into the vein.    Marland Kitchen leflunomide (ARAVA) 20 MG tablet TAKE 1 TABLET BY MOUTH EVERY DAY 30 tablet 2   No facility-administered medications prior to visit.     PAST MEDICAL HISTORY: Past Medical History:  Diagnosis Date  . Arthritis   . H/O laparoscopy 05/05/13  . History of ectopic pregnancy 05/05/13  . Hx of unilateral salpingectomy 05/05/13   Left  . Hx of wisdom tooth extraction   . Medical history non-contributory     PAST SURGICAL HISTORY: Past Surgical History:  Procedure Laterality Date  .  LAPAROSCOPY N/A 05/06/2013   Procedure: LAPAROSCOPY OPERATIVE, Left Salpingectomy, Removal Ectopic Pregnancy;  Surgeon: Mickel Baasichard D Kaplan, MD;  Location: [facility];  Service: Gynecology;  Laterality: N/A;  . WISDOM TOOTH EXTRACTION      FAMILY HISTORY: Family History  Problem Relation Age of Onset  . Psoriasis Sister     SOCIAL HISTORY: Social History   Socioeconomic History  . Marital status: Married    Spouse name: Not on file  . Number of children: Not on file  . Years of education: Not on file  . Highest education level: Not on file  Occupational History  . Not on file  Social Needs  . Financial resource strain: Not on file  . Food insecurity:     Worry: Not on file    Inability: Not on file  . Transportation needs:    Medical: Not on file    Non-medical: Not on file  Tobacco Use  . Smoking status: Former Smoker    Packs/day: 0.50    Types: Cigarettes    Last attempt to quit: 08/28/2001    Years since quitting: 17.3  . Smokeless tobacco: Never Used  Substance and Sexual Activity  . Alcohol use: No  . Drug use: No  . Sexual activity: Yes    Birth control/protection: None  Lifestyle  . Physical activity:    Days per week: Not on file    Minutes per session: Not on file  . Stress: Not on file  Relationships  . Social connections:    Talks on phone: Not on file    Gets together: Not on file    Attends religious service: Not on file    Active member of club or organization: Not on file    Attends meetings of clubs or organizations: Not on file    Relationship status: Not on file  . Intimate partner violence:    Fear of current or ex partner: Not on file    Emotionally abused: Not on file    Physically abused: Not on file    Forced sexual activity: Not on file  Other Topics Concern  . Not on file  Social History Narrative  . Not on file      PHYSICAL EXAM  Vitals:   12/16/18 1341  BP: (!) 96/58  Pulse: 80  Weight: 133 lb 6.4 oz (60.5 kg)  Height: 5\' 6"  (1.676 m)   Body mass index is 21.53 kg/m.  Generalized: Well developed, in no acute distress  Cardiology: normal rate and rhythm, no murmur noted Neurological examination  Mentation: Alert oriented to time, place, history taking. Follows all commands speech and language fluent Cranial nerve II-XII: Pupils were equal round reactive to light. Extraocular movements were full, visual field were full on confrontational test. Facial sensation and strength were normal. Uvula tongue midline. Head turning and shoulder shrug  were normal and symmetric. Motor: The motor testing reveals 5 over 5 strength of all 4 extremities. Good symmetric motor tone is noted  throughout.  Sensory: Sensory testing is intact to soft touch on all 4 extremities. No evidence of extinction is noted.  Coordination: Cerebellar testing reveals good finger-nose-finger and heel-to-shin bilaterally.  Gait and station: Gait is normal. Tandem gait is normal. Romberg is negative. No drift is seen.  Reflexes: Deep tendon reflexes are symmetric and normal bilaterally.   DIAGNOSTIC DATA (LABS, IMAGING, TESTING) - I reviewed patient records, labs, notes, testing and imaging myself where available.  No flowsheet data found.  Lab Results  Component Value Date   WBC 6.2 12/10/2018   HGB 12.0 12/10/2018   HCT 35.4 12/10/2018   MCV 90 12/10/2018   PLT 252 12/10/2018      Component Value Date/Time   NA 141 12/10/2018 1217   K 4.4 12/10/2018 1217   CL 105 12/10/2018 1217   CO2 24 12/10/2018 1217   GLUCOSE 85 12/10/2018 1217   GLUCOSE 87 08/26/2018 1057   BUN 11 12/10/2018 1217   CREATININE 0.58 12/10/2018 1217   CREATININE 0.64 08/26/2018 1057   CALCIUM 8.6 (L) 12/10/2018 1217   PROT 6.4 12/10/2018 1217   ALBUMIN 4.2 12/10/2018 1217   AST 14 12/10/2018 1217   ALT 7 12/10/2018 1217   ALKPHOS 88 12/10/2018 1217   BILITOT 0.3 12/10/2018 1217   GFRNONAA 116 12/10/2018 1217   GFRNONAA 113 08/26/2018 1057   GFRAA 134 12/10/2018 1217   GFRAA 131 08/26/2018 1057   No results found for: CHOL, HDL, LDLCALC, LDLDIRECT, TRIG, CHOLHDL No results found for: VKPQ2E No results found for: VITAMINB12 No results found for: TSH    ASSESSMENT AND PLAN 38 y.o. year old female  has a past medical history of Arthritis, H/O laparoscopy (05/05/13), History of ectopic pregnancy (05/05/13), unilateral salpingectomy (05/05/13), wisdom tooth extraction, and Medical history non-contributory. here with     ICD-10-CM   1. Rheumatoid arthritis with rheumatoid factor of multiple sites without organ or systems involvement (HCC) M05.79     She will continue Orencia infusions as prescribed.  She is  seeing Dr. Corliss Skains every 3 months.  She was last seen in November 2019.  Last infusion was 1 week ago.  She is tolerating medications well with no obvious adverse effects.  She does feel significant benefit from medication.  She was advised to follow-up in 1 year.   No orders of the defined types were placed in this encounter.    No orders of the defined types were placed in this encounter.     I spent 15 minutes with the patient. 50% of this time was spent counseling and educating patient on plan of care and medications.    Shawnie Dapper, FNP-C 12/16/2018, 2:03 PM [facility] [address] [place], [place] [address] [phone]

## 2018-12-16 NOTE — Telephone Encounter (Signed)
-----   Message from Asa Lente, MD sent at 12/13/2018 10:58 AM EST ----- Please let the patient know that the lab work is fine --- and give copy to [facility] Thx

## 2018-12-16 NOTE — Patient Instructions (Signed)
Continue infusion every 28 days Continue follow up with Dr Corliss Skains every 3 months Follow up with [facility] annually.

## 2018-12-16 NOTE — Telephone Encounter (Signed)
Called, LVM for pt to let her know labwork is fine per Dr. Epimenio Foot. Gave [facility] phone number if she has further questions. Printed and gave copy to [facility] for their records.

## 2018-12-16 NOTE — Progress Notes (Signed)
I have read the note, and I agree with the clinical assessment and plan.  Jarrod Mcenery A. Christa Fasig, MD, PhD, FAAN Certified in Neurology, Clinical Neurophysiology, Sleep Medicine, Pain Medicine and Neuroimaging  [facility] [address], [address] [place], [place] [address] [phone]  

## 2019-01-06 ENCOUNTER — Encounter: Payer: Self-pay | Admitting: *Deleted

## 2019-01-06 ENCOUNTER — Telehealth: Payer: Self-pay | Admitting: Rheumatology

## 2019-01-06 NOTE — Telephone Encounter (Signed)
Patient called stating she is scheduled for infusion at [facility] tomorrow 3/17.  Patient is requesting a return call to let her know if she should reschedule the appointment.

## 2019-01-06 NOTE — Telephone Encounter (Signed)
Patient advised that information has been sent to her via my chart about the COVID-19. Patient advised that we recommend continuing her medication unless she is showing signs and symptoms of being sick she should continue her medications as prescribed.

## 2019-01-22 NOTE — Progress Notes (Signed)
Virtual Visit via Telephone Note  I connected with Dawn [name] on 01/27/19 at 11:15 AM EDT by telephone and verified that I am speaking with the correct person using two identifiers.   I discussed the limitations, risks, security and privacy concerns of performing an evaluation and management service by telephone and the availability of in person appointments. I also discussed with the patient that there may be a patient responsible charge related to this service. The patient expressed understanding and agreed to proceed.  CC: Right wrist pain  History of Present Illness: Patient is a 38 year old female with a past medical history of rheumatoid arthritis.  She is on IV Orencia every month and Arava 20 mg po daily.  She feels this is an effective combination at this time. She had a left index PIP joint and right wrist cortisone injection on 08/26/18.  She had a left elbow cortisone injection on 02/12/18.  She continues to have intermittent right wrist pain but no joint swelling.  She has not needed OTC pain medications. She did experience right wrist pain at night last week and used a brace for 2 nights. She has intermittent right 2nd MCP joint pain.  She denies any elbow joint pain or joint swelling.  She has good ROM of the left elbow joint.  She denies any trochanteric bursitis at this time.  She denies and right sternoclavicular pain or swelling.  She states the fracture in the left foot is healed and she has no discomfort at this time.  She is not taking calcium and vitamin D at this time.  She has no sicca symptoms at this time.  She rates her RA a 3/10.   Review of Systems  Constitutional: Negative for fever and malaise/fatigue.  HENT:       Negative mouth dryness  Eyes: Negative for photophobia, pain, discharge and redness.       Negative eye dryness  Respiratory: Negative for cough, shortness of breath and wheezing.   Cardiovascular: Negative for chest pain and palpitations.   Gastrointestinal: Negative for blood in stool, constipation and diarrhea.  Genitourinary: Negative for dysuria.  Musculoskeletal: Negative for back pain, joint pain, myalgias and neck pain.  Skin: Negative for rash.  Neurological: Negative for dizziness and headaches.  Psychiatric/Behavioral: Negative for depression. The patient is not nervous/anxious and does not have insomnia.    Observations/Objective: Physical Exam  Constitutional: She is oriented to person, place, and time.  Neurological: She is alert and oriented to person, place, and time.  Psychiatric: Mood, memory, affect and judgment normal.   Patient reports morning stiffness for 5 minutes.   Patient reports nocturnal pain.  Difficulty dressing/grooming: Denies Difficulty climbing stairs: Denies Difficulty getting out of chair: Denies Difficulty using hands for taps, buttons, cutlery, and/or writing: Denies  Right wrist tenderness but no swelling.  Left elbow joint contracture has resolved.   Assessment and Plan: organ or systems involvement (HCC) - Positive RF, positive anti-CCP, erosive disease: She continues to have intermittent right wrist pain but no joint swelling.  She has been experiencing some stiffness and nocturnal pain in the right wrist joint.   She wore a right wrist brace last week for 2 night but has not been taking anything OTC for pain relief.  She had a cortisone injection on 08/26/18.  She continues to take Arava 20 mg po daily and receives IV Orencia infusions every month.  She has not missed any doses recently and feels that this is an  effective combination and does not want to make any changes at this time. She has no other joint pain or joint swelling at this time.  She will continue on this current treatment regimen.  She was advised to notify us if she develops increased joint pain or joint swelling.  She will follow up in 3 months.    High risk medication use -  IV Orencia and Arava 20 mg po daily  (methotrexate-headaches, inadequate response to Humira, Simponi, subcu Orencia). TB gold negative 08/26/18.  She has lab work with infusions.  CBC and CMP were drawn on 12/10/18. She will be due for lab work in May and every 3 months.    Contracture of left elbow - She had a left elbow cortisone injection on 02/12/2018.  Sicca syndrome, unspecified (HCC): She has no sicca symptoms at this time.   Trochanteric bursitis of both hips: Resolved  Pain of right sternoclavicular joint: She has no pain or swelling at this time.   Closed displaced fracture of fifth metatarsal bone of left foot with routine healing, subsequent encounter: She rolled her left foot and stepped off a platform on 08/24/18.  She was evaluated in the ED on 08/24/18 and had a XR that revealed a minimally displaced transverse fracture of the base of the left fifth metatarsal shaft. She was placed in a posterior splint and provided crutches.  She states the fracture has healed.  She has no discomfort at this time.    Pain in right wrist: She had a cortisone injection at her last visit on 08/26/18.  She continues to have intermittent right wrist pain.  No joint swelling.  She had increased pain last week and had nocturnal pain for 2 nights.  She wore a right wrist brace for 2 nights.  She has some residual tenderness but the pain is improving.  She has not been taking anything for pain relief.  We discussed using a topical agent such as aspercreme for pain relief.    Follow Up Instructions: She will follow up in 3 months.   A refill of Arava will be sent to the pharmacy.  She will be due for lab work in May and every 3 months. Standing orders are in place.   I discussed the assessment and treatment plan with the patient. The patient was provided an opportunity to ask questions and all were answered. The patient agreed with the plan and demonstrated an understanding of the instructions.   The patient was advised to call back or seek an  in-person evaluation if the symptoms worsen or if the condition fails to improve as anticipated.  I provided 22 minutes of non-face-to-face time during this encounter. Pollyann Savoy, MD   Scribed by-  Gearldine Bienenstock, PA-C

## 2019-01-27 ENCOUNTER — Encounter: Payer: Self-pay | Admitting: Rheumatology

## 2019-01-27 ENCOUNTER — Ambulatory Visit: Payer: BLUE CROSS/BLUE SHIELD | Admitting: Physician Assistant

## 2019-01-27 ENCOUNTER — Telehealth (INDEPENDENT_AMBULATORY_CARE_PROVIDER_SITE_OTHER): Payer: BLUE CROSS/BLUE SHIELD | Admitting: Rheumatology

## 2019-01-27 DIAGNOSIS — M3500 Sicca syndrome, unspecified: Secondary | ICD-10-CM

## 2019-01-27 DIAGNOSIS — M24522 Contracture, left elbow: Secondary | ICD-10-CM

## 2019-01-27 DIAGNOSIS — M0579 Rheumatoid arthritis with rheumatoid factor of multiple sites without organ or systems involvement: Secondary | ICD-10-CM

## 2019-01-27 DIAGNOSIS — S92352D Displaced fracture of fifth metatarsal bone, left foot, subsequent encounter for fracture with routine healing: Secondary | ICD-10-CM

## 2019-01-27 DIAGNOSIS — Z79899 Other long term (current) drug therapy: Secondary | ICD-10-CM

## 2019-01-27 DIAGNOSIS — M7061 Trochanteric bursitis, right hip: Secondary | ICD-10-CM

## 2019-01-27 DIAGNOSIS — M25511 Pain in right shoulder: Secondary | ICD-10-CM

## 2019-01-27 DIAGNOSIS — M7062 Trochanteric bursitis, left hip: Secondary | ICD-10-CM

## 2019-01-27 MED ORDER — LEFLUNOMIDE 20 MG PO TABS: 20.0000 mg | ORAL_TABLET | Freq: Every day | ORAL | 0 refills | Status: DC

## 2019-01-28 ENCOUNTER — Telehealth: Payer: Self-pay | Admitting: Rheumatology

## 2019-01-28 NOTE — Telephone Encounter (Signed)
I LMOM for patient to call, and schedule a rov for 3 months with Sherron Ales.

## 2019-01-28 NOTE — Telephone Encounter (Signed)
-----   Message from Dawn [name], CMA sent at 01/27/2019 11:45 AM EDT ----- Patient had a virtual visit today with Dr. Corliss Skains. Please call to schedule 3 month follow up. Thanks!

## 2019-02-04 ENCOUNTER — Other Ambulatory Visit: Payer: Self-pay | Admitting: Rheumatology

## 2019-03-19 ENCOUNTER — Other Ambulatory Visit: Payer: Self-pay | Admitting: *Deleted

## 2019-03-19 ENCOUNTER — Other Ambulatory Visit: Payer: Self-pay

## 2019-03-19 DIAGNOSIS — M0579 Rheumatoid arthritis with rheumatoid factor of multiple sites without organ or systems involvement: Secondary | ICD-10-CM

## 2019-03-19 NOTE — Addendum Note (Signed)
Addended by: Tamera Stands D on: 03/19/2019 04:07 PM   Modules accepted: Orders

## 2019-03-20 ENCOUNTER — Telehealth: Payer: Self-pay | Admitting: *Deleted

## 2019-03-20 LAB — COMPREHENSIVE METABOLIC PANEL
ALT: 7 IU/L (ref 0–32)
AST: 13 IU/L (ref 0–40)
Albumin/Globulin Ratio: 2.4 — ABNORMAL HIGH (ref 1.2–2.2)
Albumin: 4.5 g/dL (ref 3.8–4.8)
Alkaline Phosphatase: 80 IU/L (ref 39–117)
BUN/Creatinine Ratio: 16 (ref 9–23)
BUN: 10 mg/dL (ref 6–20)
Bilirubin Total: 0.2 mg/dL (ref 0.0–1.2)
CO2: 22 mmol/L (ref 20–29)
Calcium: 8.8 mg/dL (ref 8.7–10.2)
Chloride: 105 mmol/L (ref 96–106)
Creatinine, Ser: 0.61 mg/dL (ref 0.57–1.00)
GFR calc Af Amer: 132 mL/min/{1.73_m2} (ref >59)
GFR calc non Af Amer: 115 mL/min/{1.73_m2} (ref >59)
Globulin, Total: 1.9 g/dL (ref 1.5–4.5)
Glucose: 85 mg/dL (ref 65–99)
Potassium: 4.4 mmol/L (ref 3.5–5.2)
Sodium: 142 mmol/L (ref 134–144)
Total Protein: 6.4 g/dL (ref 6.0–8.5)

## 2019-03-20 LAB — CBC WITH DIFFERENTIAL/PLATELET
Basophils Absolute: 0.1 10*3/uL (ref 0.0–0.2)
Basos: 1 %
EOS (ABSOLUTE): 0.5 10*3/uL — ABNORMAL HIGH (ref 0.0–0.4)
Eos: 9 %
Hematocrit: 34.5 % (ref 34.0–46.6)
Hemoglobin: 12.1 g/dL (ref 11.1–15.9)
Immature Grans (Abs): 0 10*3/uL (ref 0.0–0.1)
Immature Granulocytes: 0 %
Lymphocytes Absolute: 1.7 10*3/uL (ref 0.7–3.1)
Lymphs: 29 %
MCH: 30.7 pg (ref 26.6–33.0)
MCHC: 35.1 g/dL (ref 31.5–35.7)
MCV: 88 fL (ref 79–97)
Monocytes Absolute: 0.4 10*3/uL (ref 0.1–0.9)
Monocytes: 7 %
Neutrophils Absolute: 3.2 10*3/uL (ref 1.4–7.0)
Neutrophils: 54 %
Platelets: 250 10*3/uL (ref 150–450)
RBC: 3.94 x10E6/uL (ref 3.77–5.28)
RDW: 12.8 % (ref 11.7–15.4)
WBC: 6 10*3/uL (ref 3.4–10.8)

## 2019-03-20 NOTE — Telephone Encounter (Signed)
Called pt. Advised labs fine per Dr. Epimenio Foot. She verbalized understanding.

## 2019-03-20 NOTE — Telephone Encounter (Signed)
-----   Message from Asa Lente, MD sent at 03/20/2019  8:27 AM EDT ----- Please let the patient know that the lab work is fine.

## 2019-05-15 ENCOUNTER — Telehealth: Payer: Self-pay | Admitting: *Deleted

## 2019-05-15 NOTE — Telephone Encounter (Signed)
Lab tech informed Liane, RN ([facility]) that pt got stuck once and did not want to be stuck again to get labs. She left without getting labs completed.

## 2019-05-17 ENCOUNTER — Other Ambulatory Visit: Payer: Self-pay | Admitting: Physician Assistant

## 2019-05-19 NOTE — Telephone Encounter (Signed)
Last Visit: 01/27/19  Next Visit due July 2020. Message sent to the front to schedule patient.  Labs: 03/19/19 Albumin 2.4 EOS (absolute) 0.5  Okay to refill per Dr. Estanislado Pandy

## 2019-05-19 NOTE — Telephone Encounter (Signed)
Please schedule patient for a follow up visit. Patient due July 2020. Thanks! 

## 2019-05-20 NOTE — Telephone Encounter (Signed)
LMOM for patient to call and schedule follow-up appointment.   °

## 2019-06-03 ENCOUNTER — Telehealth: Payer: Self-pay | Admitting: Rheumatology

## 2019-06-03 NOTE — Telephone Encounter (Signed)
Patient states she stopped taking Arava in March. Patient states she had an issue with vertigo on 05/31/19. Patient states her glucose and blood pressure checked out when ems came to check her out. Patient is getting her Orencia infusions at [facility] every 4 weeks. Patient was unable able to have her labs drawn in July as they were unable to get an adequate sample. Patient states she stopped the [place] due to stomach pain in her upper which were severe. Patient states she just wanted to make you aware.

## 2019-06-03 NOTE — Telephone Encounter (Signed)
Patient called stating "she has a couple of medications she would like to discuss with you directly."   Patient requested a return call.

## 2019-06-12 NOTE — Addendum Note (Signed)
Addended by: Inis Sizer D on: 06/12/2019 10:38 AM   Modules accepted: Orders

## 2019-06-13 LAB — CBC WITH DIFFERENTIAL/PLATELET
Basophils Absolute: 0 10*3/uL (ref 0.0–0.2)
Basos: 1 %
EOS (ABSOLUTE): 0.1 10*3/uL (ref 0.0–0.4)
Eos: 2 %
Hematocrit: 38.4 % (ref 34.0–46.6)
Hemoglobin: 12.4 g/dL (ref 11.1–15.9)
Immature Grans (Abs): 0 10*3/uL (ref 0.0–0.1)
Immature Granulocytes: 0 %
Lymphocytes Absolute: 1.8 10*3/uL (ref 0.7–3.1)
Lymphs: 31 %
MCH: 28.3 pg (ref 26.6–33.0)
MCHC: 32.3 g/dL (ref 31.5–35.7)
MCV: 88 fL (ref 79–97)
Monocytes Absolute: 0.4 10*3/uL (ref 0.1–0.9)
Monocytes: 6 %
Neutrophils Absolute: 3.7 10*3/uL (ref 1.4–7.0)
Neutrophils: 60 %
Platelets: 266 10*3/uL (ref 150–450)
RBC: 4.38 x10E6/uL (ref 3.77–5.28)
RDW: 11.7 % (ref 11.7–15.4)
WBC: 6 10*3/uL (ref 3.4–10.8)

## 2019-06-13 LAB — COMPREHENSIVE METABOLIC PANEL
ALT: 7 IU/L (ref 0–32)
AST: 18 IU/L (ref 0–40)
Albumin/Globulin Ratio: 1.8 (ref 1.2–2.2)
Albumin: 4.2 g/dL (ref 3.8–4.8)
Alkaline Phosphatase: 92 IU/L (ref 39–117)
BUN/Creatinine Ratio: 15 (ref 9–23)
BUN: 10 mg/dL (ref 6–20)
Bilirubin Total: 0.2 mg/dL (ref 0.0–1.2)
CO2: 19 mmol/L — ABNORMAL LOW (ref 20–29)
Calcium: 8.8 mg/dL (ref 8.7–10.2)
Chloride: 104 mmol/L (ref 96–106)
Creatinine, Ser: 0.67 mg/dL (ref 0.57–1.00)
GFR calc Af Amer: 128 mL/min/{1.73_m2} (ref >59)
GFR calc non Af Amer: 111 mL/min/{1.73_m2} (ref >59)
Globulin, Total: 2.4 g/dL (ref 1.5–4.5)
Glucose: 87 mg/dL (ref 65–99)
Potassium: 4.5 mmol/L (ref 3.5–5.2)
Sodium: 141 mmol/L (ref 134–144)
Total Protein: 6.6 g/dL (ref 6.0–8.5)

## 2019-06-16 ENCOUNTER — Telehealth: Payer: Self-pay | Admitting: *Deleted

## 2019-06-16 NOTE — Telephone Encounter (Signed)
-----   Message from Richard A Sater, MD sent at 06/13/2019 11:21 AM EDT ----- Please let the patient know that the lab work is fine.  

## 2019-08-06 ENCOUNTER — Other Ambulatory Visit: Payer: Self-pay | Admitting: *Deleted

## 2019-08-06 DIAGNOSIS — Z79899 Other long term (current) drug therapy: Secondary | ICD-10-CM

## 2019-08-06 DIAGNOSIS — M0579 Rheumatoid arthritis with rheumatoid factor of multiple sites without organ or systems involvement: Secondary | ICD-10-CM

## 2019-08-06 NOTE — Addendum Note (Signed)
Addended by: Inis Sizer D on: 08/06/2019 11:29 AM   Modules accepted: Orders

## 2019-08-07 ENCOUNTER — Telehealth: Payer: Self-pay | Admitting: *Deleted

## 2019-08-07 LAB — CBC WITH DIFFERENTIAL/PLATELET
Basophils Absolute: 0 10*3/uL (ref 0.0–0.2)
Basos: 1 %
EOS (ABSOLUTE): 0.1 10*3/uL (ref 0.0–0.4)
Eos: 3 %
Hematocrit: 38.3 % (ref 34.0–46.6)
Hemoglobin: 12.6 g/dL (ref 11.1–15.9)
Immature Grans (Abs): 0 10*3/uL (ref 0.0–0.1)
Immature Granulocytes: 0 %
Lymphocytes Absolute: 1.7 10*3/uL (ref 0.7–3.1)
Lymphs: 36 %
MCH: 28.3 pg (ref 26.6–33.0)
MCHC: 32.9 g/dL (ref 31.5–35.7)
MCV: 86 fL (ref 79–97)
Monocytes Absolute: 0.3 10*3/uL (ref 0.1–0.9)
Monocytes: 7 %
Neutrophils Absolute: 2.4 10*3/uL (ref 1.4–7.0)
Neutrophils: 53 %
Platelets: 280 10*3/uL (ref 150–450)
RBC: 4.46 x10E6/uL (ref 3.77–5.28)
RDW: 13.1 % (ref 11.7–15.4)
WBC: 4.6 10*3/uL (ref 3.4–10.8)

## 2019-08-07 LAB — COMPREHENSIVE METABOLIC PANEL
ALT: 11 IU/L (ref 0–32)
AST: 16 IU/L (ref 0–40)
Albumin/Globulin Ratio: 1.5 (ref 1.2–2.2)
Albumin: 4.1 g/dL (ref 3.8–4.8)
Alkaline Phosphatase: 90 IU/L (ref 39–117)
BUN/Creatinine Ratio: 13 (ref 9–23)
BUN: 7 mg/dL (ref 6–20)
Bilirubin Total: 0.3 mg/dL (ref 0.0–1.2)
CO2: 23 mmol/L (ref 20–29)
Calcium: 9.1 mg/dL (ref 8.7–10.2)
Chloride: 106 mmol/L (ref 96–106)
Creatinine, Ser: 0.55 mg/dL — ABNORMAL LOW (ref 0.57–1.00)
GFR calc Af Amer: 137 mL/min/{1.73_m2} (ref >59)
GFR calc non Af Amer: 119 mL/min/{1.73_m2} (ref >59)
Globulin, Total: 2.8 g/dL (ref 1.5–4.5)
Glucose: 74 mg/dL (ref 65–99)
Potassium: 4.3 mmol/L (ref 3.5–5.2)
Sodium: 142 mmol/L (ref 134–144)
Total Protein: 6.9 g/dL (ref 6.0–8.5)

## 2019-08-07 NOTE — Telephone Encounter (Signed)
-----   Message from Britt Bottom, MD sent at 08/07/2019 12:04 PM EDT ----- Please let the patient know that the lab work is fine.

## 2019-08-26 NOTE — Progress Notes (Signed)
Office Visit Note  Patient: [name]             Date of Birth: [birth year]           MRN: [id number]             PCP: [facility] Referring: [facility], [facility] Visit Date: 08/28/2019 Occupation: @GUAROCC @  Subjective:  Right hand pain    History of Present Illness: Dawn [name] is a 38 y.o. female with history of seropositive rheumatoid arthritis.  She is on Orencia IV infusions every 28 days. Her last infusion was on 08/06/19.  She discontinued Arava due to not being to tolerate the GI side effects.  She reports that she has been having pain and swelling intermittently in the right hand for the past 6 weeks. She states that the discomfort in her right hand seems to be related to overuse activities such as yard work. She also has chronic pain in both feet.     Activities of Daily Living:  Patient reports morning stiffness for 30  minutes.   Patient Denies nocturnal pain.  Difficulty dressing/grooming: Denies Difficulty climbing stairs: Denies Difficulty getting out of chair: Denies Difficulty using hands for taps, buttons, cutlery, and/or writing: Denies  Review of Systems  Constitutional: Negative for fatigue.  HENT: Positive for mouth dryness. Negative for mouth sores and nose dryness.   Eyes: Negative for pain, visual disturbance and dryness.  Respiratory: Negative for cough, hemoptysis, shortness of breath and difficulty breathing.   Cardiovascular: Negative for chest pain, palpitations, hypertension and swelling in legs/feet.  Gastrointestinal: Negative for blood in stool, constipation and diarrhea.  Endocrine: Negative for increased urination.  Genitourinary: Negative for painful urination.  Musculoskeletal: Positive for arthralgias, joint pain, joint swelling and morning stiffness. Negative for myalgias, muscle weakness, muscle tenderness and myalgias.  Skin: Negative for color change, pallor, rash, hair loss, nodules/bumps, skin  tightness, ulcers and sensitivity to sunlight.  Allergic/Immunologic: Negative for susceptible to infections.  Neurological: Negative for dizziness, numbness, headaches and weakness.  Hematological: Negative for swollen glands.  Psychiatric/Behavioral: Negative for depressed mood and sleep disturbance. The patient is not nervous/anxious.     PMFS History:  Patient Active Problem List   Diagnosis Date Noted  . Left foot pain 10/04/2018  . Sicca syndrome, unspecified (HCC) 02/21/2017  . High risk medication use 01/03/2017  . Rheumatoid arthritis with rheumatoid factor of multiple sites without organ or systems involvement (HCC) 06/26/2016  . Status post induction of labor 01/13/2015  . Term pregnancy 01/13/2015  . Spontaneous vaginal delivery 01/13/2015    Past Medical History:  Diagnosis Date  . Arthritis   . H/O laparoscopy 05/05/13  . History of ectopic pregnancy 05/05/13  . Hx of unilateral salpingectomy 05/05/13   Left  . Hx of wisdom tooth extraction   . Medical history non-contributory     Family History  Problem Relation Age of Onset  . Psoriasis Sister    Past Surgical History:  Procedure Laterality Date  . LAPAROSCOPY N/A 05/06/2013   Procedure: LAPAROSCOPY OPERATIVE, Left Salpingectomy, Removal Ectopic Pregnancy;  Surgeon: 05/08/2013, MD;  Location: [facility];  Service: Gynecology;  Laterality: N/A;  . WISDOM TOOTH EXTRACTION     Social History   Social History Narrative  . Not on file   Immunization History  Administered Date(s) Administered  . Tdap 01/14/2015     Objective: Vital Signs: BP (!) 84/50 (BP Location: Left Arm, Patient Position: Sitting, Cuff Size:  Small)   Pulse 82   Resp 12   Ht 5\' 6"  (1.676 m)   Wt 138 lb 12.8 oz (63 kg)   LMP 08/25/2019   BMI 22.40 kg/m    Physical Exam Vitals signs and nursing note reviewed.  Constitutional:      Appearance: She is well-developed.  HENT:     Head: Normocephalic and atraumatic.  Eyes:      Conjunctiva/sclera: Conjunctivae normal.  Neck:     Musculoskeletal: Normal range of motion.  Cardiovascular:     Rate and Rhythm: Normal rate and regular rhythm.     Heart sounds: Normal heart sounds.  Pulmonary:     Effort: Pulmonary effort is normal.     Breath sounds: Normal breath sounds.  Abdominal:     General: Bowel sounds are normal.     Palpations: Abdomen is soft.  Lymphadenopathy:     Cervical: No cervical adenopathy.  Skin:    General: Skin is warm and dry.     Capillary Refill: Capillary refill takes less than 2 seconds.  Neurological:     Mental Status: She is alert and oriented to person, place, and time.  Psychiatric:        Behavior: Behavior normal.      Musculoskeletal Exam: C-spine, thoracic spine, lumbar spine good range of motion.  No midline spinal tenderness.  No SI joint tenderness.  Shoulder joints and elbow joints have good range of motion with no discomfort.  Synovial thickening of the left elbow joint noted.  Tenderness over the right wrist joint.  She has synovitis and tenderness of the right second MCP and PIP joint.  Hip joints, knee joints ankle joints, MTPs, PIPs, DIPs good range of motion no synovitis.  No warmth or effusion of bilateral knee joints.  Bilateral knee crepitus noted.  No tenderness or swelling of ankle joints.  No tenderness of MTP joints.  CDAI Exam: CDAI Score: 5.6  Patient Global: 3 mm; Provider Global: 3 mm Swollen: 2 ; Tender: 3  Joint Exam      Right  Left  Wrist   Tender     MCP 2  Swollen Tender     PIP 2  Swollen Tender        Investigation: No additional findings.  Imaging: No results found.  Recent Labs: Lab Results  Component Value Date   WBC 4.6 08/06/2019   HGB 12.6 08/06/2019   PLT 280 08/06/2019   NA 142 08/06/2019   K 4.3 08/06/2019   CL 106 08/06/2019   CO2 23 08/06/2019   GLUCOSE 74 08/06/2019   BUN 7 08/06/2019   CREATININE 0.55 (L) 08/06/2019   BILITOT 0.3 08/06/2019   ALKPHOS 90  08/06/2019   AST 16 08/06/2019   ALT 11 08/06/2019   PROT 6.9 08/06/2019   ALBUMIN 4.1 08/06/2019   CALCIUM 9.1 08/06/2019   GFRAA 137 08/06/2019   QFTBGOLD NEGATIVE 08/28/2017   QFTBGOLDPLUS Negative 12/10/2018    Speciality Comments: Orenvia IV every 4 weeks at [facility].  Procedures:  No procedures performed Allergies: Patient has no known allergies.   Assessment / Plan:     Visit Diagnoses: Rheumatoid arthritis with rheumatoid factor of multiple sites without organ or systems involvement ([place]) -  Positive RF, positive anti-CCP, erosive disease: She has tenderness and synovitis of the right 2nd MCP and PIP joint.  Tenderness of the right wrist joint note.  She had a right intercarpal cortisone injection on 08/26/2018 which helped with the  inflammation but she continues to have persistent tenderness.  She is currently on IV Orencia infusions every 28 days as monotherapy.  She discontinued Arava due to not being able to tolerate the GI side effects.  In the past she had tried methotrexate but could not tolerate the side effects but is willing to try it again in the injectable form. She had an inadequate response to Humira and Simponi in the past.  She is apprehensive to discontinue Orencia at this time.  We discussed adding on MTX sq injections once weekly to the current treatment regimen.  Indications, contraindications, potential side effects of Methotrexate were discussed today.  Consent was obtained.  She was advised to notify us if she develops increased joint pain or joint swelling.  She will follow-up in the office in 3 months.  High risk medication use - IV Orencia infusions every 28 days (last infusion 08/06/19), D/c Arava due to GI SE. (methotrexate-headaches, inadequate response to Humira, Simponi, subcu Orencia).  Last TB gold negative on 12/10/2018 and will monitor yearly.  Most recent CBC/CMP within normal limits on 08/06/2019 and will monitor every 3 months.   Contracture of left elbow:  She has synovial thickening of the left elbow joint.  She has good ROM with no tenderness at this time.   Sicca syndrome, unspecified (HCC): She has chronic sicca symptoms.   Trochanteric bursitis of both hips: Resolved   Pain of right sternoclavicular joint: Resolved   Closed displaced fracture of fifth metatarsal bone of left foot with routine healing, subsequent encounter: Healed   Orders: No orders of the defined types were placed in this encounter.  No orders of the defined types were placed in this encounter.   Face-to-face time spent with patient was 30 minutes. Greater than 50% of time was spent in counseling and coordination of care.  Follow-Up Instructions: Return in about 6 weeks (around 10/09/2019) for Rheumatoid arthritis.   Gearldine Bienenstockaylor M Mireille Lacombe, PA-C   I examined and evaluated the patient with Sherron Alesaylor Marshea Wisher PA.  Patient still has ongoing synovitis.  Although she is doing much better with Orencia.  I detailed discussion regarding different treatment options.  We will proceed with subcu methotrexate.  She has tried methotrexate few years back and could not tolerate it.  She is willing to give it another try.  I would also give her a short course of prednisone taper.  The plan of care was discussed as noted above.  Pollyann SavoyShaili Deveshwar, MD  Note - This record has been created using [employer] software.  Chart creation errors have been sought, but may not always  have been located. Such creation errors do not reflect on  the standard of medical car

## 2019-08-28 ENCOUNTER — Other Ambulatory Visit: Payer: Self-pay

## 2019-08-28 ENCOUNTER — Telehealth: Payer: Self-pay | Admitting: Pharmacist

## 2019-08-28 ENCOUNTER — Encounter: Payer: Self-pay | Admitting: Physician Assistant

## 2019-08-28 ENCOUNTER — Ambulatory Visit: Payer: BLUE CROSS/BLUE SHIELD | Admitting: Rheumatology

## 2019-08-28 VITALS — BP 84/50 | HR 82 | Resp 12 | Ht 66.0 in | Wt 138.8 lb

## 2019-08-28 DIAGNOSIS — S92352D Displaced fracture of fifth metatarsal bone, left foot, subsequent encounter for fracture with routine healing: Secondary | ICD-10-CM

## 2019-08-28 DIAGNOSIS — Z79899 Other long term (current) drug therapy: Secondary | ICD-10-CM | POA: Diagnosis not present

## 2019-08-28 DIAGNOSIS — M24522 Contracture, left elbow: Secondary | ICD-10-CM | POA: Diagnosis not present

## 2019-08-28 DIAGNOSIS — M7062 Trochanteric bursitis, left hip: Secondary | ICD-10-CM

## 2019-08-28 DIAGNOSIS — M7061 Trochanteric bursitis, right hip: Secondary | ICD-10-CM

## 2019-08-28 DIAGNOSIS — M3500 Sicca syndrome, unspecified: Secondary | ICD-10-CM

## 2019-08-28 DIAGNOSIS — M25511 Pain in right shoulder: Secondary | ICD-10-CM

## 2019-08-28 DIAGNOSIS — M0579 Rheumatoid arthritis with rheumatoid factor of multiple sites without organ or systems involvement: Secondary | ICD-10-CM

## 2019-08-28 MED ORDER — PREDNISONE 5 MG PO TABS: ORAL_TABLET | ORAL | 0 refills | Status: AC

## 2019-08-28 NOTE — Telephone Encounter (Signed)
Plan prefers Rasuvo.  Submitted a Prior Authorization request to [employer] for [place] via Cover My Meds. Will update once we receive a response.  11:02 AM Beatriz Chancellor, CPhT

## 2019-08-28 NOTE — Progress Notes (Signed)
Pharmacy Note  Subjective: Patient presents today to the [facility] to see Dr. Estanislado Pandy.  Patient seen by the pharmacist for counseling on methotrexate for rheumatoid arthritis. She is currently on Orencia IV. Prior therapy includes Arava, oral methotrexate, Enbrel, Humira.  Objective: CBC    Component Value Date/Time   WBC 4.6 08/06/2019 1131   WBC 5.9 08/26/2018 1057   RBC 4.46 08/06/2019 1131   RBC 4.14 08/26/2018 1057   HGB 12.6 08/06/2019 1131   HCT 38.3 08/06/2019 1131   PLT 280 08/06/2019 1131   MCV 86 08/06/2019 1131   MCH 28.3 08/06/2019 1131   MCH 31.2 08/26/2018 1057   MCHC 32.9 08/06/2019 1131   MCHC 33.7 08/26/2018 1057   RDW 13.1 08/06/2019 1131   LYMPHSABS 1.7 08/06/2019 1131   MONOABS 392 05/25/2017 1350   EOSABS 0.1 08/06/2019 1131   BASOSABS 0.0 08/06/2019 1131    CMP     Component Value Date/Time   NA 142 08/06/2019 1131   K 4.3 08/06/2019 1131   CL 106 08/06/2019 1131   CO2 23 08/06/2019 1131   GLUCOSE 74 08/06/2019 1131   GLUCOSE 87 08/26/2018 1057   BUN 7 08/06/2019 1131   CREATININE 0.55 (L) 08/06/2019 1131   CREATININE 0.64 08/26/2018 1057   CALCIUM 9.1 08/06/2019 1131   PROT 6.9 08/06/2019 1131   ALBUMIN 4.1 08/06/2019 1131   AST 16 08/06/2019 1131   ALT 11 08/06/2019 1131   ALKPHOS 90 08/06/2019 1131   BILITOT 0.3 08/06/2019 1131   GFRNONAA 119 08/06/2019 1131   GFRNONAA 113 08/26/2018 1057   GFRAA 137 08/06/2019 1131   GFRAA 131 08/26/2018 1057    Baseline Immunosuppressant Therapy Labs TB GOLD Quantiferon TB Gold Latest Ref Rng & Units 12/10/2018  Quantiferon TB Gold Plus Negative Negative   Hepatitis Panel Hepatitis 07/14/2014  Hep B Surface Ag Negative   HIV Lab Results  Component Value Date   HIV Non-reactive 07/14/2014   Immunoglobulins   SPEP Serum Protein Electrophoresis Latest Ref Rng & Units 08/06/2019  Total Protein 6.0 - 8.5 g/dL 6.9   G6PD No results found for: G6PDH TPMT No results found  for: TPMT   Chest-xray:  No active cardiopulmonary disease  09/23/2015  Contraception: husband had a vasectomy  Alcohol use: N/A  Assessment/Plan:   Patient was counseled on the purpose, proper use, and adverse effects of methotrexate including nausea, infection, and signs and symptoms of pneumonitis. Discussed that there is the possibility of an increased risk of malignancy, specifically lymphomas, but it is not well understood if this increased risk is due to the medication or the disease state.  Instructed patient that medication should be held for infection and prior to surgery.  Advised patient to avoid live vaccines. Recommend annual influenza, Pneumovax 23, Prevnar 13, and Shingrix as indicated.   Reviewed instructions with patient to take methotrexate weekly along with folic acid daily.  Discussed the importance of frequent monitoring of kidney and liver function and blood counts, and provided patient with standing lab instructions.  Counseled patient to avoid NSAIDs and alcohol while on methotrexate.  Provided patient with educational materials on methotrexate and answered all questions.   Patient voiced understanding.  Patient consented to methotrexate use.  Will upload into chart.    Dose of methotrexate will be 0.6 ml every 7 days for 2 weeks then increase to 0.8 ml every 7 days as tolerated along with folic acid 2 mg daily. Patient would like to apply  for injectable pen through insurance.  Prescription will be sent in pending insurance approval.  Patient also given prednisone 20 mg taper by 5 mg every 2 days.  All questions encouraged and answered.  Instructed patient to call with any questions or concerns.  Verlin Fester, PharmD, [place], CPP Clinical Specialty Pharmacist [phone]  08/28/2019 10:19 AM

## 2019-08-28 NOTE — Telephone Encounter (Signed)
Please start BIV for methotrexate pen.  She is on Orencia infusion.  She has tried oral methotrexate and discontinued due to GI effects.  She discontinued Arava due to side effects. She had inadequate response to Simponi and Humira.  She has tenderness and synovitis of the right 2nd MCP and PIP joint.  Tenderness of the right wrist joint making it difficult to use vial and syringe.   Mariella Saa, PharmD, [place], [place] Clinical Specialty Pharmacist [phone]  08/28/2019 10:23 AM

## 2019-08-28 NOTE — Patient Instructions (Addendum)
Standing Labs We placed an order today for your standing lab work.    Please come back and get your standing labs in 2 weeks x2, then 2 months, and every 3 months.   We have open lab daily Monday through Thursday from 8:30-12:30 PM and 1:30-4:30 PM and Friday from 8:30-12:30 PM and 1:30-4:00 PM at the office of Dr. Bo Merino.   You may experience shorter wait times on Monday and Friday afternoons. The office is located at [address], [place], [place],  [address] No appointment is necessary.   Labs are drawn by [facility].  You may receive a bill from [facility] for your lab work.  If you wish to have your labs drawn at another location, please call the office 24 hours in advance to send orders.  If you have any questions regarding directions or hours of operation,  please call [phone].   Just as a reminder please drink plenty of water prior to coming for your lab work. Thanks!  Vaccines You are taking a medication(s) that can suppress your immune system.  The following immunizations are recommended: . Flu annually . Pneumonia (Pneumovax 23 and Prevnar 13 spaced at least 1 year apart)   Please check with your PCP to make sure you are up to date.

## 2019-09-03 NOTE — Telephone Encounter (Signed)
Received notification from [facility] regarding a prior authorization for RASUVO. Authorization has been APPROVED from 09/03/19 to 09/02/20.   Will send document to scan center.  Authorization # [id number]   1:44 PM Beatriz Chancellor, CPhT

## 2019-09-08 MED ORDER — RASUVO 15 MG/0.3ML ~~LOC~~ SOAJ: 15.0000 mg | SUBCUTANEOUS | 0 refills | Status: DC

## 2019-09-08 NOTE — Telephone Encounter (Signed)
Left voicemail notifying of approval and that she could find a co-pay card online.  Prescription sent to pharmacy.  Mariella Saa, PharmD, [place], [place] Clinical Specialty Pharmacist [phone]  09/08/2019 1:37 PM

## 2019-09-16 NOTE — Addendum Note (Signed)
Addended by: Inis Sizer D on: 09/16/2019 10:55 AM   Modules accepted: Orders

## 2019-09-17 ENCOUNTER — Telehealth: Payer: Self-pay

## 2019-09-17 LAB — COMPREHENSIVE METABOLIC PANEL
ALT: 5 IU/L (ref 0–32)
AST: 13 IU/L (ref 0–40)
Albumin/Globulin Ratio: 2 (ref 1.2–2.2)
Albumin: 4.7 g/dL (ref 3.8–4.8)
Alkaline Phosphatase: 88 IU/L (ref 39–117)
BUN/Creatinine Ratio: 21 (ref 9–23)
BUN: 15 mg/dL (ref 6–20)
Bilirubin Total: 0.5 mg/dL (ref 0.0–1.2)
CO2: 19 mmol/L — ABNORMAL LOW (ref 20–29)
Calcium: 9.2 mg/dL (ref 8.7–10.2)
Chloride: 104 mmol/L (ref 96–106)
Creatinine, Ser: 0.7 mg/dL (ref 0.57–1.00)
GFR calc Af Amer: 126 mL/min/{1.73_m2} (ref >59)
GFR calc non Af Amer: 109 mL/min/{1.73_m2} (ref >59)
Globulin, Total: 2.3 g/dL (ref 1.5–4.5)
Glucose: 100 mg/dL — ABNORMAL HIGH (ref 65–99)
Potassium: 4.3 mmol/L (ref 3.5–5.2)
Sodium: 140 mmol/L (ref 134–144)
Total Protein: 7 g/dL (ref 6.0–8.5)

## 2019-09-17 LAB — CBC WITH DIFFERENTIAL/PLATELET
Basophils Absolute: 0 10*3/uL (ref 0.0–0.2)
Basos: 0 %
EOS (ABSOLUTE): 0 10*3/uL (ref 0.0–0.4)
Eos: 1 %
Hematocrit: 39.6 % (ref 34.0–46.6)
Hemoglobin: 12.3 g/dL (ref 11.1–15.9)
Immature Grans (Abs): 0 10*3/uL (ref 0.0–0.1)
Immature Granulocytes: 0 %
Lymphocytes Absolute: 1.2 10*3/uL (ref 0.7–3.1)
Lymphs: 20 %
MCH: 28.1 pg (ref 26.6–33.0)
MCHC: 31.1 g/dL — ABNORMAL LOW (ref 31.5–35.7)
MCV: 90 fL (ref 79–97)
Monocytes Absolute: 0.4 10*3/uL (ref 0.1–0.9)
Monocytes: 7 %
Neutrophils Absolute: 4 10*3/uL (ref 1.4–7.0)
Neutrophils: 72 %
Platelets: 242 10*3/uL (ref 150–450)
RBC: 4.38 x10E6/uL (ref 3.77–5.28)
RDW: 13.7 % (ref 11.7–15.4)
WBC: 5.7 10*3/uL (ref 3.4–10.8)

## 2019-09-17 NOTE — Telephone Encounter (Signed)
-----   Message from Britt Bottom, MD sent at 09/17/2019 12:28 PM EST ----- Please let the patient know that the lab work is fine.

## 2019-09-17 NOTE — Telephone Encounter (Signed)
Pt notified of results and verbalized understanding  

## 2019-09-21 IMAGING — DX DG FOOT COMPLETE 3+V*L*
3 series · 3 of 3 positions shown · non-contrast
Comparison: None.

CLINICAL DATA: Patient stepped off of a platform. Left lateral foot
pain.

EXAM:
LEFT FOOT - COMPLETE 3+ VIEW

[foot ap]
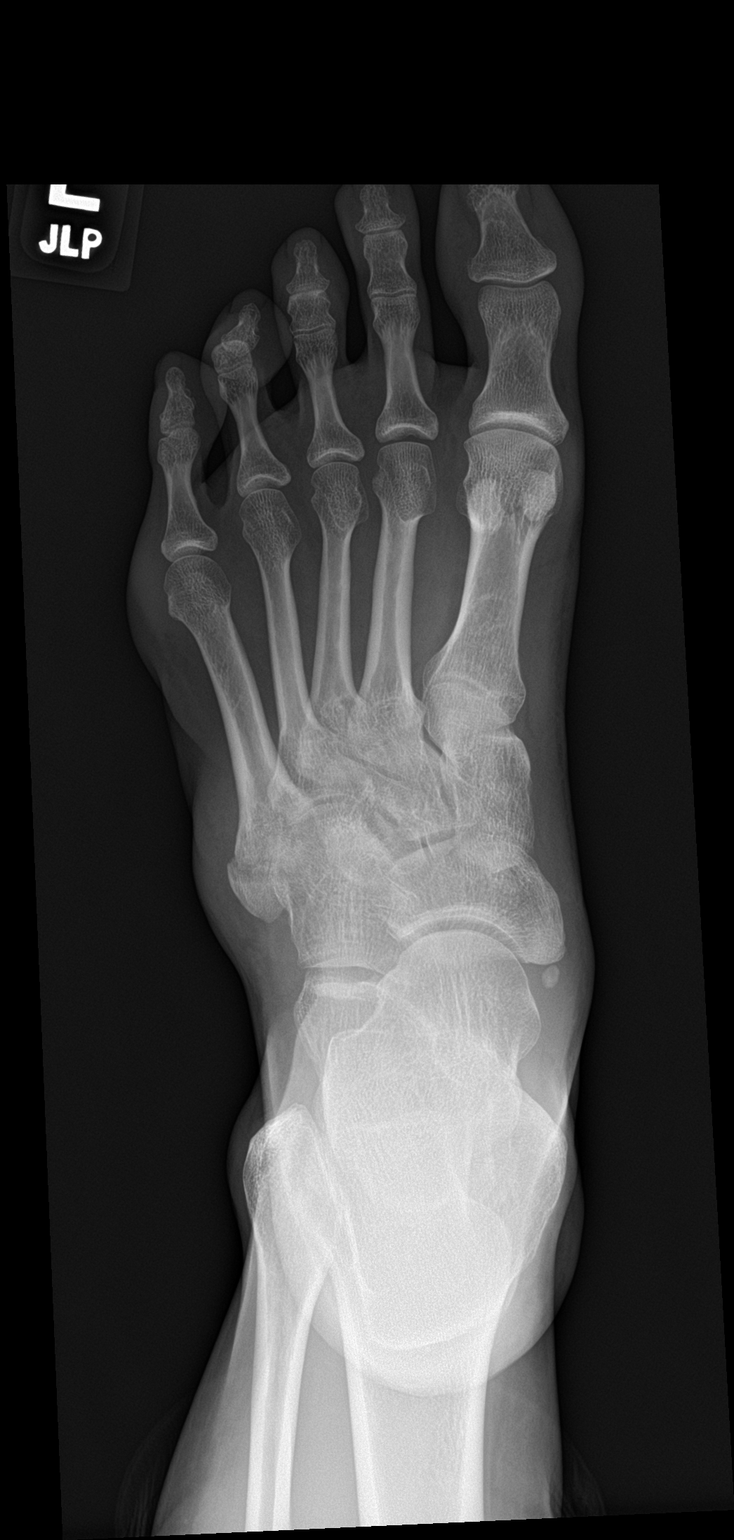

[foot obl]
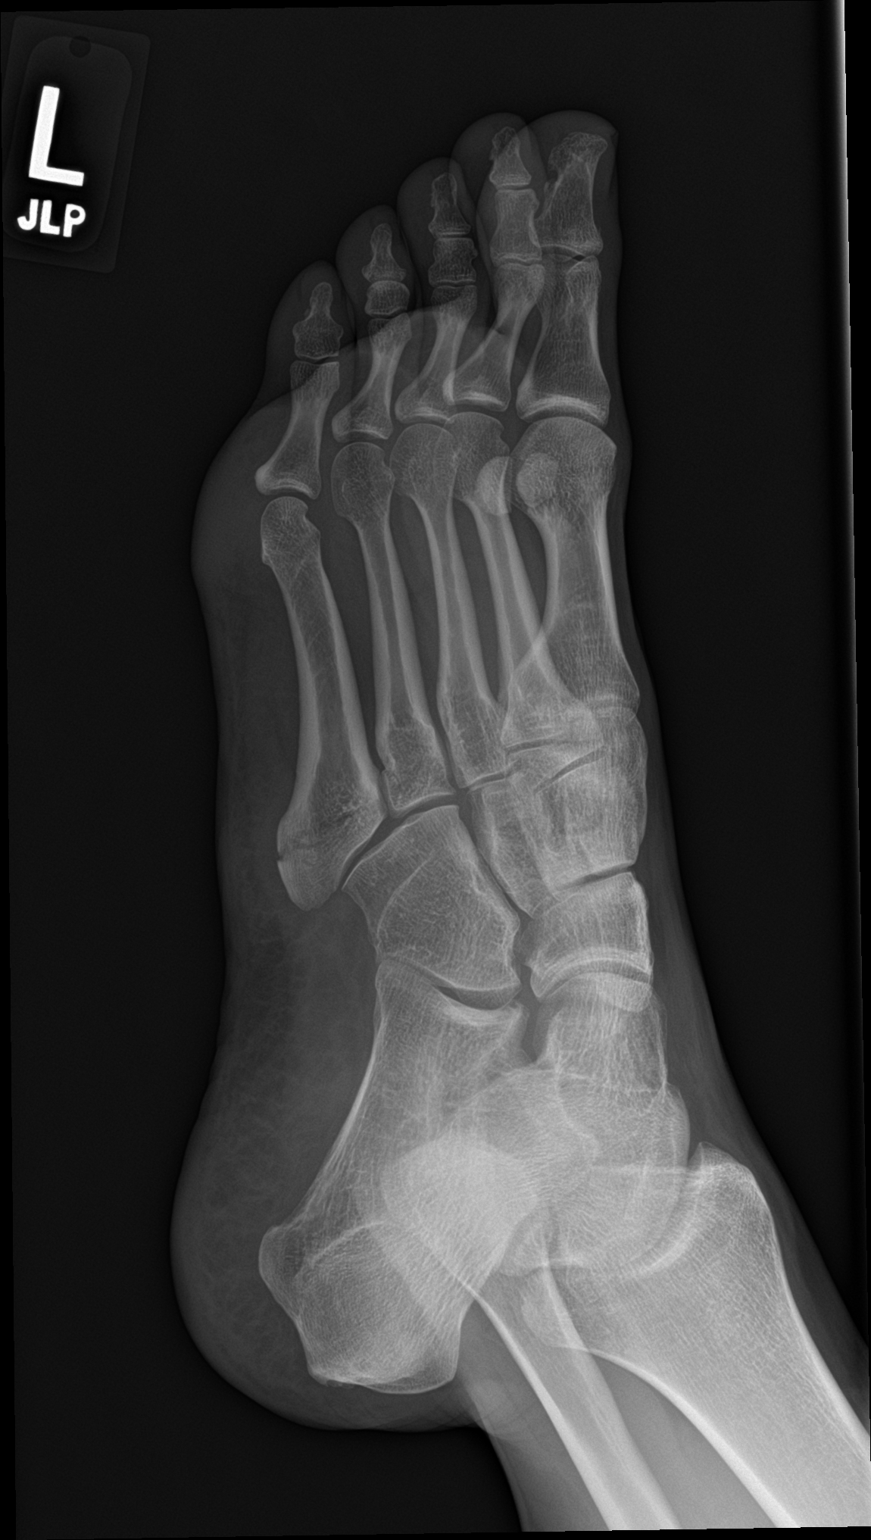

[foot lat]
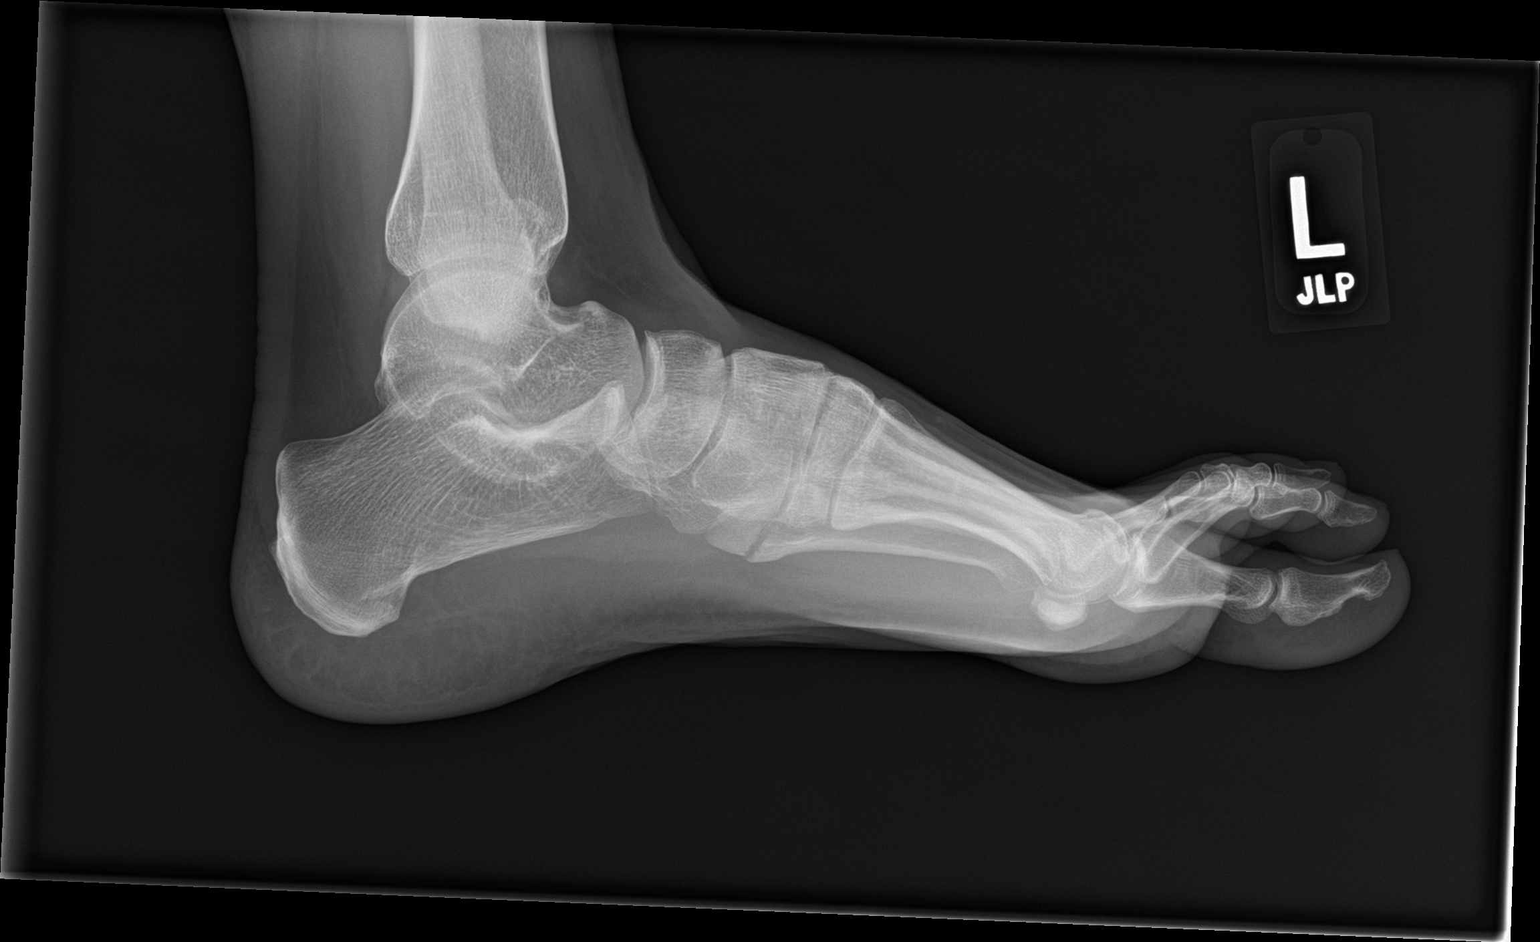

[3 of 3 positions shown; findings below may reference images not displayed]

FINDINGS: There is a minimally displaced transverse base of the left fifth
metatarsal shaft fracture, with associated soft tissue swelling.

The osseous mineralization is normal.  No significant arthropathy.
IMPRESSION: Minimally displaced transverse fracture of the base of the left
fifth metatarsal shaft.

## 2019-11-14 ENCOUNTER — Telehealth: Payer: Self-pay | Admitting: Rheumatology

## 2019-11-14 NOTE — Telephone Encounter (Signed)
Patient's husband Dawn [name] left a Dawn [name] has been running a fever between 100.0 to 103.1 for the last 7 days.  Dawn [name] states she is experiencing severe headaches and fatigue.  Dawn [name] states she had a COVID test last Sunday, 11/09/19 which was negative and went to [facility] yesterday and had another COVID test.  They haven't received the results from the 2nd test.  Dawn [name] states that Dawn [name] is concerned because she is taking Orencia. Orie Rout to have patient hold medications while patient is sick. Advised him that if patient is to test positive for COVID she would need to hold her medications for 4 weeks. He states he will have patient see at urgent care and will update our office.

## 2019-11-14 NOTE — Telephone Encounter (Signed)
Patient's husband Dawn [name] left a voicemail stating Dawn [name] has been running a fever between 100.0 to 103.1 for the last 7 days.  Dawn [name] states she is experiencing severe headaches and fatigue.  Dawn [name] states she had a COVID test last Sunday, 11/09/19 which was negative and went to [facility] yesterday and had another COVID test.  They haven't received the results from the 2nd test.  Brad states that Dawn [name] is concerned because she is taking Orencia.  Dawn [name] is requesting that you call Dawn [name] on her cell #[phone]

## 2019-11-17 ENCOUNTER — Telehealth: Payer: Self-pay | Admitting: *Deleted

## 2019-11-17 DIAGNOSIS — M0579 Rheumatoid arthritis with rheumatoid factor of multiple sites without organ or systems involvement: Secondary | ICD-10-CM

## 2019-11-17 DIAGNOSIS — G039 Meningitis, unspecified: Secondary | ICD-10-CM

## 2019-11-17 NOTE — Telephone Encounter (Signed)
I returned patient's call and spoke with his wife.  I had detailed conversation with [name].  She states she developed fever last weekend and initially thought that she had COVID-19 infection.  She states she has been tested twice and has been negative.  Last weekend she went to [facility] due to severe headache and chills.  She states she was hospitalized and was started on IV antibiotics.  They are suspecting that she may have meningitis.  The initial spinal tap was negative for bacterial infection per patient.  She had another spinal tap today.  She is also trying to find a spot at [facility] to be transferred.  She is waiting on bed availability.  Her last dose of Orencia was about a month ago.  She did not to start Rasuvo.  Have advised her to stay off Orencia until she is completely cleared from infection.  I also encouraged her transfer to either [facility] or [facility] for thorough evaluation as she will be close to the infectious disease specialist.  I also left a message for her husband to call me back his number is [phone]

## 2019-11-17 NOTE — Telephone Encounter (Signed)
Patient was taken to [facility] by EMS. Patient has been admitted and was tested for multiple things. Patient had a spinal tap and it came out positive for meningitis. Patient's husband states that the doctorr is unsure if it is viral or bacterial and are wanting to do another spinal tap. Patient is concerned about having a second one done. Patient is on Orencia infusions and Rasuvo 15 mg. Patient's husband was questioning about the being any relation with the meningitis and the infusions.

## 2019-11-18 ENCOUNTER — Emergency Department (HOSPITAL_COMMUNITY): Payer: BC Managed Care – PPO

## 2019-11-18 ENCOUNTER — Encounter (HOSPITAL_COMMUNITY): Payer: Self-pay | Admitting: Emergency Medicine

## 2019-11-18 ENCOUNTER — Inpatient Hospital Stay (HOSPITAL_COMMUNITY)
Admission: EM | Admit: 2019-11-18 | Discharge: 2019-11-29 | DRG: 076 | Disposition: A | Discharge status: Home / Self Care | Payer: BC Managed Care – PPO | Attending: Internal Medicine | Admitting: Internal Medicine

## 2019-11-18 ENCOUNTER — Other Ambulatory Visit: Payer: Self-pay

## 2019-11-18 DIAGNOSIS — G039 Meningitis, unspecified: Secondary | ICD-10-CM

## 2019-11-18 DIAGNOSIS — M069 Rheumatoid arthritis, unspecified: Secondary | ICD-10-CM

## 2019-11-18 DIAGNOSIS — G030 Nonpyogenic meningitis: Secondary | ICD-10-CM

## 2019-11-18 DIAGNOSIS — Z20822 Contact with and (suspected) exposure to covid-19: Secondary | ICD-10-CM | POA: Diagnosis present

## 2019-11-18 DIAGNOSIS — Z87891 Personal history of nicotine dependence: Secondary | ICD-10-CM

## 2019-11-18 DIAGNOSIS — I776 Arteritis, unspecified: Secondary | ICD-10-CM | POA: Diagnosis present

## 2019-11-18 DIAGNOSIS — E860 Dehydration: Secondary | ICD-10-CM | POA: Diagnosis not present

## 2019-11-18 DIAGNOSIS — R2990 Unspecified symptoms and signs involving the nervous system: Secondary | ICD-10-CM

## 2019-11-18 DIAGNOSIS — Z840 Family history of diseases of the skin and subcutaneous tissue: Secondary | ICD-10-CM

## 2019-11-18 DIAGNOSIS — R197 Diarrhea, unspecified: Secondary | ICD-10-CM

## 2019-11-18 DIAGNOSIS — A879 Viral meningitis, unspecified: Principal | ICD-10-CM | POA: Diagnosis present

## 2019-11-18 DIAGNOSIS — R55 Syncope and collapse: Secondary | ICD-10-CM

## 2019-11-18 DIAGNOSIS — M1990 Unspecified osteoarthritis, unspecified site: Secondary | ICD-10-CM | POA: Diagnosis present

## 2019-11-18 DIAGNOSIS — M0579 Rheumatoid arthritis with rheumatoid factor of multiple sites without organ or systems involvement: Secondary | ICD-10-CM | POA: Diagnosis present

## 2019-11-18 DIAGNOSIS — R519 Headache, unspecified: Secondary | ICD-10-CM

## 2019-11-18 LAB — URINALYSIS, ROUTINE W REFLEX MICROSCOPIC
Bilirubin Urine: NEGATIVE
Glucose, UA: NEGATIVE mg/dL
Ketones, ur: NEGATIVE mg/dL
Leukocytes,Ua: NEGATIVE
Nitrite: NEGATIVE
Protein, ur: NEGATIVE mg/dL
Specific Gravity, Urine: 1.028 (ref 1.005–1.030)
pH: 7 (ref 5.0–8.0)

## 2019-11-18 LAB — I-STAT BETA HCG BLOOD, ED (MC, WL, AP ONLY): I-stat hCG, quantitative: <5 m[IU]/mL (ref <5)

## 2019-11-18 LAB — CBC WITH DIFFERENTIAL/PLATELET
Abs Immature Granulocytes: 0.33 10*3/uL — ABNORMAL HIGH (ref 0.00–0.07)
Basophils Absolute: 0 10*3/uL (ref 0.0–0.1)
Basophils Relative: 0 %
Eosinophils Absolute: 0 10*3/uL (ref 0.0–0.5)
Eosinophils Relative: 0 %
HCT: 35.1 % — ABNORMAL LOW (ref 36.0–46.0)
Hemoglobin: 11.6 g/dL — ABNORMAL LOW (ref 12.0–15.0)
Immature Granulocytes: 3 %
Lymphocytes Relative: 15 %
Lymphs Abs: 1.6 10*3/uL (ref 0.7–4.0)
MCH: 27.2 pg (ref 26.0–34.0)
MCHC: 33 g/dL (ref 30.0–36.0)
MCV: 82.4 fL (ref 80.0–100.0)
Monocytes Absolute: 0.9 10*3/uL (ref 0.1–1.0)
Monocytes Relative: 8 %
Neutro Abs: 8.4 10*3/uL — ABNORMAL HIGH (ref 1.7–7.7)
Neutrophils Relative %: 74 %
Platelets: 403 10*3/uL — ABNORMAL HIGH (ref 150–400)
RBC: 4.26 MIL/uL (ref 3.87–5.11)
RDW: 13.1 % (ref 11.5–15.5)
WBC: 11.2 10*3/uL — ABNORMAL HIGH (ref 4.0–10.5)
nRBC: 0 % (ref 0.0–0.2)

## 2019-11-18 LAB — COMPREHENSIVE METABOLIC PANEL
ALT: 11 U/L (ref 0–44)
AST: 14 U/L — ABNORMAL LOW (ref 15–41)
Albumin: 3.2 g/dL — ABNORMAL LOW (ref 3.5–5.0)
Alkaline Phosphatase: 69 U/L (ref 38–126)
Anion gap: 11 (ref 5–15)
BUN: 12 mg/dL (ref 6–20)
CO2: 22 mmol/L (ref 22–32)
Calcium: 8.4 mg/dL — ABNORMAL LOW (ref 8.9–10.3)
Chloride: 107 mmol/L (ref 98–111)
Creatinine, Ser: 0.52 mg/dL (ref 0.44–1.00)
GFR calc Af Amer: >60 mL/min (ref >60)
GFR calc non Af Amer: >60 mL/min (ref >60)
Glucose, Bld: 119 mg/dL — ABNORMAL HIGH (ref 70–99)
Potassium: 3.5 mmol/L (ref 3.5–5.1)
Sodium: 140 mmol/L (ref 135–145)
Total Bilirubin: 0.6 mg/dL (ref 0.3–1.2)
Total Protein: 6.2 g/dL — ABNORMAL LOW (ref 6.5–8.1)

## 2019-11-18 LAB — LACTIC ACID, PLASMA: Lactic Acid, Venous: 2 mmol/L (ref 0.5–1.9)

## 2019-11-18 MED ORDER — HYDROCODONE-ACETAMINOPHEN 5-325 MG PO TABS
1.00 | ORAL_TABLET | ORAL | Status: DC
Start: ? — End: 2019-11-18

## 2019-11-18 MED ORDER — GUAIFENESIN 100 MG/5ML PO SYRP
200.00 | ORAL_SOLUTION | ORAL | Status: DC
Start: ? — End: 2019-11-18

## 2019-11-18 MED ORDER — POLYETHYLENE GLYCOL 3350 17 GM/SCOOP PO POWD
17.00 | ORAL | Status: DC
Start: ? — End: 2019-11-18

## 2019-11-18 MED ORDER — GENERIC EXTERNAL MEDICATION
2.00 | Status: DC
Start: 2019-11-18 — End: 2019-11-18

## 2019-11-18 MED ORDER — SIMETHICONE 80 MG PO CHEW
80.00 | CHEWABLE_TABLET | ORAL | Status: DC
Start: ? — End: 2019-11-18

## 2019-11-18 MED ORDER — SODIUM CHLORIDE 0.9 % IV SOLN
75.00 | INTRAVENOUS | Status: DC
Start: ? — End: 2019-11-18

## 2019-11-18 MED ORDER — SODIUM CHLORIDE 0.9 % IV SOLN
10.00 | INTRAVENOUS | Status: DC
Start: ? — End: 2019-11-18

## 2019-11-18 MED ORDER — ALUM & MAG HYDROXIDE-SIMETH 200-200-20 MG/5ML PO SUSP
15.00 | ORAL | Status: DC
Start: ? — End: 2019-11-18

## 2019-11-18 MED ORDER — ONDANSETRON HCL 4 MG/2ML IJ SOLN
4.00 | INTRAMUSCULAR | Status: DC
Start: ? — End: 2019-11-18

## 2019-11-18 MED ORDER — GENERIC EXTERNAL MEDICATION
Status: DC
Start: ? — End: 2019-11-18

## 2019-11-18 MED ORDER — PROMETHAZINE HCL 25 MG/ML IJ SOLN
12.50 | INTRAMUSCULAR | Status: DC
Start: 2019-11-19 — End: 2019-11-18

## 2019-11-18 MED ORDER — MORPHINE SULFATE (PF) 2 MG/ML IV SOLN
2.00 | INTRAVENOUS | Status: DC
Start: ? — End: 2019-11-18

## 2019-11-18 MED ORDER — ZOLPIDEM TARTRATE 5 MG PO TABS
5.00 | ORAL_TABLET | ORAL | Status: DC
Start: ? — End: 2019-11-18

## 2019-11-18 MED ORDER — GENERIC EXTERNAL MEDICATION
50.00 | Status: DC
Start: ? — End: 2019-11-18

## 2019-11-18 MED ORDER — ALBUTEROL SULFATE (2.5 MG/3ML) 0.083% IN NEBU
2.50 | INHALATION_SOLUTION | RESPIRATORY_TRACT | Status: DC
Start: ? — End: 2019-11-18

## 2019-11-18 MED ORDER — NITROGLYCERIN 0.4 MG SL SUBL
0.40 | SUBLINGUAL_TABLET | SUBLINGUAL | Status: DC
Start: ? — End: 2019-11-18

## 2019-11-18 MED ORDER — ENOXAPARIN SODIUM 40 MG/0.4ML ~~LOC~~ SOLN
40.00 | SUBCUTANEOUS | Status: DC
Start: 2019-11-19 — End: 2019-11-18

## 2019-11-18 MED ORDER — SODIUM CHLORIDE 0.9% FLUSH: 3.0000 mL | Freq: Once | INTRAVENOUS | Status: DC

## 2019-11-18 MED ORDER — LINEZOLID 600 MG PO TABS
600.00 | ORAL_TABLET | ORAL | Status: DC
Start: 2019-11-18 — End: 2019-11-18

## 2019-11-18 NOTE — Telephone Encounter (Signed)
I called to check on patient.  She states that she will have a PICC line placed and she will be discharged home with 14 days of antibiotics.  I discussed with her that I would like for her to be seen by infectious disease so we can make decisions regarding future treatment.  Please make a referral to ID.

## 2019-11-18 NOTE — ED Triage Notes (Signed)
Pt reports she left [name] hospital after being discharged w/ Bacterial meningitis.  She continues to have a headache, nausea and low energy.  She states she has loose stools and passed out on the toilet "for a couple of minutes."  Also states her fever was 100.6.

## 2019-11-18 NOTE — Addendum Note (Signed)
Addended by: Henriette Combs on: 11/18/2019 11:18 AM   Modules accepted: Orders

## 2019-11-18 NOTE — Telephone Encounter (Signed)
Referral placed for ID.

## 2019-11-19 ENCOUNTER — Encounter (HOSPITAL_COMMUNITY): Payer: Self-pay | Admitting: Family Medicine

## 2019-11-19 ENCOUNTER — Telehealth: Payer: Self-pay | Admitting: Rheumatology

## 2019-11-19 ENCOUNTER — Observation Stay (HOSPITAL_BASED_OUTPATIENT_CLINIC_OR_DEPARTMENT_OTHER): Payer: BC Managed Care – PPO

## 2019-11-19 ENCOUNTER — Inpatient Hospital Stay (HOSPITAL_COMMUNITY): Payer: BC Managed Care – PPO

## 2019-11-19 DIAGNOSIS — Z20822 Contact with and (suspected) exposure to covid-19: Secondary | ICD-10-CM | POA: Diagnosis present

## 2019-11-19 DIAGNOSIS — R55 Syncope and collapse: Secondary | ICD-10-CM | POA: Diagnosis not present

## 2019-11-19 DIAGNOSIS — M0579 Rheumatoid arthritis with rheumatoid factor of multiple sites without organ or systems involvement: Secondary | ICD-10-CM | POA: Diagnosis present

## 2019-11-19 DIAGNOSIS — R2990 Unspecified symptoms and signs involving the nervous system: Secondary | ICD-10-CM | POA: Diagnosis not present

## 2019-11-19 DIAGNOSIS — I38 Endocarditis, valve unspecified: Secondary | ICD-10-CM | POA: Diagnosis not present

## 2019-11-19 DIAGNOSIS — R519 Headache, unspecified: Secondary | ICD-10-CM | POA: Diagnosis not present

## 2019-11-19 DIAGNOSIS — Z87891 Personal history of nicotine dependence: Secondary | ICD-10-CM | POA: Diagnosis not present

## 2019-11-19 DIAGNOSIS — R930 Abnormal findings on diagnostic imaging of skull and head, not elsewhere classified: Secondary | ICD-10-CM | POA: Diagnosis not present

## 2019-11-19 DIAGNOSIS — R5381 Other malaise: Secondary | ICD-10-CM | POA: Diagnosis not present

## 2019-11-19 DIAGNOSIS — Z840 Family history of diseases of the skin and subcutaneous tissue: Secondary | ICD-10-CM | POA: Diagnosis not present

## 2019-11-19 DIAGNOSIS — M1990 Unspecified osteoarthritis, unspecified site: Secondary | ICD-10-CM | POA: Diagnosis present

## 2019-11-19 DIAGNOSIS — R630 Anorexia: Secondary | ICD-10-CM | POA: Diagnosis not present

## 2019-11-19 DIAGNOSIS — I776 Arteritis, unspecified: Secondary | ICD-10-CM | POA: Diagnosis present

## 2019-11-19 DIAGNOSIS — G030 Nonpyogenic meningitis: Secondary | ICD-10-CM

## 2019-11-19 DIAGNOSIS — G039 Meningitis, unspecified: Secondary | ICD-10-CM

## 2019-11-19 DIAGNOSIS — G4489 Other headache syndrome: Secondary | ICD-10-CM | POA: Diagnosis not present

## 2019-11-19 DIAGNOSIS — M057A Rheumatoid arthritis with rheumatoid factor of other specified site without organ or systems involvement: Secondary | ICD-10-CM | POA: Diagnosis not present

## 2019-11-19 DIAGNOSIS — R5383 Other fatigue: Secondary | ICD-10-CM | POA: Diagnosis not present

## 2019-11-19 DIAGNOSIS — A879 Viral meningitis, unspecified: Secondary | ICD-10-CM | POA: Diagnosis present

## 2019-11-19 DIAGNOSIS — E860 Dehydration: Secondary | ICD-10-CM | POA: Diagnosis present

## 2019-11-19 DIAGNOSIS — R509 Fever, unspecified: Secondary | ICD-10-CM | POA: Diagnosis not present

## 2019-11-19 LAB — ECHOCARDIOGRAM COMPLETE
Height: 66 in
Weight: 2112 oz

## 2019-11-19 LAB — LACTIC ACID, PLASMA: Lactic Acid, Venous: 1.5 mmol/L (ref 0.5–1.9)

## 2019-11-19 LAB — SARS CORONAVIRUS 2 (TAT 6-24 HRS): SARS Coronavirus 2: NEGATIVE

## 2019-11-19 LAB — TSH: TSH: 1.395 u[IU]/mL (ref 0.350–4.500)

## 2019-11-19 MED ORDER — LACTATED RINGERS IV BOLUS
1000.0000 mL | Freq: Once | INTRAVENOUS | Status: AC
  Administered 2019-11-19: 1000 mL via INTRAVENOUS

## 2019-11-19 MED ORDER — MORPHINE SULFATE (PF) 2 MG/ML IV SOLN
2.0000 mg | INTRAVENOUS | Status: DC | PRN
  Administered 2019-11-19 (×2): 4 mg via INTRAVENOUS
  Filled 2019-11-19 (×2): qty 2

## 2019-11-19 MED ORDER — METOCLOPRAMIDE HCL 5 MG/ML IJ SOLN
5.0000 mg | Freq: Three times a day (TID) | INTRAMUSCULAR | Status: DC
  Administered 2019-11-19 – 2019-11-29 (×26): 5 mg via INTRAVENOUS
  Filled 2019-11-19 (×27): qty 2

## 2019-11-19 MED ORDER — GENERIC EXTERNAL MEDICATION
Status: DC
Start: ? — End: 2019-11-19

## 2019-11-19 MED ORDER — SODIUM CHLORIDE 0.9 % IV SOLN
8.0000 mg | Freq: Once | INTRAVENOUS | Status: AC
  Administered 2019-11-19: 8 mg via INTRAVENOUS
  Filled 2019-11-19: qty 4

## 2019-11-19 MED ORDER — SUMATRIPTAN SUCCINATE 6 MG/0.5ML ~~LOC~~ SOLN
6.0000 mg | SUBCUTANEOUS | Status: DC | PRN
  Administered 2019-11-25 – 2019-11-26 (×4): 6 mg via SUBCUTANEOUS
  Filled 2019-11-19 (×6): qty 0.5

## 2019-11-19 MED ORDER — KETOROLAC TROMETHAMINE 30 MG/ML IJ SOLN
15.0000 mg | Freq: Once | INTRAMUSCULAR | Status: AC
  Administered 2019-11-19: 15 mg via INTRAVENOUS
  Filled 2019-11-19: qty 1

## 2019-11-19 MED ORDER — LINEZOLID 600 MG/300ML IV SOLN
600.0000 mg | Freq: Once | INTRAVENOUS | Status: AC
  Administered 2019-11-19: 600 mg via INTRAVENOUS
  Filled 2019-11-19: qty 300

## 2019-11-19 MED ORDER — ACETAMINOPHEN 325 MG PO TABS
650.0000 mg | ORAL_TABLET | Freq: Four times a day (QID) | ORAL | Status: DC | PRN
  Administered 2019-11-19 – 2019-11-29 (×18): 650 mg via ORAL
  Filled 2019-11-19 (×18): qty 2

## 2019-11-19 MED ORDER — SODIUM CHLORIDE 0.9 % IV SOLN: INTRAVENOUS | Status: DC

## 2019-11-19 MED ORDER — KETOROLAC TROMETHAMINE 15 MG/ML IJ SOLN
7.5000 mg | Freq: Three times a day (TID) | INTRAMUSCULAR | Status: DC
  Administered 2019-11-19 – 2019-11-22 (×8): 7.5 mg via INTRAVENOUS
  Filled 2019-11-19 (×9): qty 1

## 2019-11-19 MED ORDER — SODIUM CHLORIDE 0.9 % IV SOLN
2.0000 g | Freq: Two times a day (BID) | INTRAVENOUS | Status: DC
  Administered 2019-11-19 – 2019-11-25 (×12): 2 g via INTRAVENOUS
  Filled 2019-11-19 (×2): qty 20
  Filled 2019-11-19: qty 2
  Filled 2019-11-19: qty 20
  Filled 2019-11-19 (×3): qty 2
  Filled 2019-11-19: qty 20
  Filled 2019-11-19 (×2): qty 2
  Filled 2019-11-19 (×2): qty 20

## 2019-11-19 MED ORDER — LINEZOLID 600 MG/300ML IV SOLN
600.0000 mg | Freq: Two times a day (BID) | INTRAVENOUS | Status: DC
  Administered 2019-11-19 – 2019-11-24 (×10): 600 mg via INTRAVENOUS
  Filled 2019-11-19 (×12): qty 300

## 2019-11-19 MED ORDER — SODIUM CHLORIDE 0.9% FLUSH
3.0000 mL | Freq: Two times a day (BID) | INTRAVENOUS | Status: DC
  Administered 2019-11-20 – 2019-11-23 (×3): 3 mL via INTRAVENOUS

## 2019-11-19 MED ORDER — SODIUM CHLORIDE 0.9 % IV SOLN
10.00 | INTRAVENOUS | Status: DC
Start: ? — End: 2019-11-19

## 2019-11-19 MED ORDER — BOOST / RESOURCE BREEZE PO LIQD CUSTOM
1.0000 | Freq: Three times a day (TID) | ORAL | Status: DC
  Administered 2019-11-20 – 2019-11-26 (×2): 1 via ORAL

## 2019-11-19 MED ORDER — PROMETHAZINE HCL 25 MG/ML IJ SOLN: 12.5000 mg | Freq: Four times a day (QID) | INTRAMUSCULAR | Status: DC | PRN

## 2019-11-19 MED ORDER — SODIUM CHLORIDE 0.9 % IV SOLN: INTRAVENOUS | Status: AC

## 2019-11-19 MED ORDER — ADULT MULTIVITAMIN W/MINERALS CH
1.0000 | ORAL_TABLET | Freq: Every day | ORAL | Status: DC
  Administered 2019-11-20 – 2019-11-29 (×8): 1 via ORAL
  Filled 2019-11-19 (×11): qty 1

## 2019-11-19 MED ORDER — LACTATED RINGERS IV BOLUS
2000.0000 mL | Freq: Once | INTRAVENOUS | Status: AC
  Administered 2019-11-19: 2000 mL via INTRAVENOUS

## 2019-11-19 MED ORDER — CHLORHEXIDINE GLUCONATE CLOTH 2 % EX PADS
6.0000 | MEDICATED_PAD | Freq: Every day | CUTANEOUS | Status: DC
  Administered 2019-11-19 – 2019-11-28 (×10): 6 via TOPICAL

## 2019-11-19 MED ORDER — SODIUM CHLORIDE 0.9 % IV SOLN
2.0000 g | Freq: Once | INTRAVENOUS | Status: AC
  Administered 2019-11-19: 2 g via INTRAVENOUS
  Filled 2019-11-19: qty 20

## 2019-11-19 MED ORDER — VANCOMYCIN HCL 1250 MG/250ML IV SOLN
1250.0000 mg | Freq: Three times a day (TID) | INTRAVENOUS | Status: DC
  Filled 2019-11-19: qty 250

## 2019-11-19 MED ORDER — ONDANSETRON HCL 4 MG/2ML IJ SOLN
4.0000 mg | Freq: Four times a day (QID) | INTRAMUSCULAR | Status: DC | PRN
  Administered 2019-11-20 – 2019-11-26 (×5): 4 mg via INTRAVENOUS
  Filled 2019-11-19 (×5): qty 2

## 2019-11-19 MED ORDER — PROCHLORPERAZINE EDISYLATE 10 MG/2ML IJ SOLN
10.0000 mg | Freq: Once | INTRAMUSCULAR | Status: AC
  Administered 2019-11-19: 10 mg via INTRAVENOUS
  Filled 2019-11-19: qty 2

## 2019-11-19 NOTE — Plan of Care (Addendum)
39 year old female admitted this morning after being discharged from [facility] for presumed bacterial meningitis on IV antibiotics has a PICC line placed went home and came back to [facility] with ongoing complaints of nausea headache and apparently passed out while she was sitting in the toilet.  She continues to be nauseous and has not had anything to eat or drink.  Her blood pressure is still low and is orthostatic.  She was given a bolus of normal saline 1 L.  Currently her blood pressure is 101/58 with a pulse of 48-57 respiration 18 temperature 98.2.  T-max 100.4.  White count on admission 11.2.  Lactic acid 2 down to 1.5.  On Vanco and Rocephin.  Will get records from [facility].  Continue IV fluids and IV antibiotics.  I will consult infectious disease.  Patient has history of rheumatoid arthritis and is on Orencia last dose was a month ago.  Discussed with patient's husband.  Patient was discharged from [facility] at 6 PM 11/18/2019.  While at home she passed out in the toilet and he called EMS and brought her back to the hospital.

## 2019-11-19 NOTE — ED Provider Notes (Signed)
Emergency Department Provider Note   I have reviewed the triage vital signs and the nursing notes.   HISTORY  Chief Complaint Headache   HPI Dawn [name] is a 39 y.o. female who was just discharged from outside hospital today secondary to meningitis of unclear etiology.  On review of the records it appears that she had CSF withdrawn twice.  The first time appeared to be bacterial as she had a high white count and a low glucose and high protein.  Predominantly lymphocytes.  Nothing grew on the cultures and viral panels were negative.  A second 1 was done  a couple days later but also did not grow anything out.  She was continued on antibiotics and discharged today.  She started some nausea and look like she has some low blood pressures at time of discharge the CT scan was done and negative for any intra-abdominal processes earlier today.  Patient says she went home because she had multiple episodes of diarrhea she is significantly nauseous and just felt unwell and lightheaded.  She states that she syncopized while in the bathroom.  No trauma associated with that.  She returns here for further management.  She is on immune modulator secondary to rheumatoid arthritis but no other significant past medical history.  She has a PICC line in place but has not received antibiotics at home yet.  No other associated or modifying symptoms.    Past Medical History:  Diagnosis Date  . Arthritis   . H/O laparoscopy 05/05/13  . History of ectopic pregnancy 05/05/13  . Hx of unilateral salpingectomy 05/05/13   Left  . Hx of wisdom tooth extraction   . Medical history non-contributory     Patient Active Problem List   Diagnosis Date Noted  . Meningitis 11/19/2019  . Syncope   . Left foot pain 10/04/2018  . Sicca syndrome, unspecified (HCC) 02/21/2017  . High risk medication use 01/03/2017  . Rheumatoid arthritis with rheumatoid factor of multiple sites without organ or systems involvement (HCC)  06/26/2016  . Status post induction of labor 01/13/2015  . Term pregnancy 01/13/2015  . Spontaneous vaginal delivery 01/13/2015    Past Surgical History:  Procedure Laterality Date  . LAPAROSCOPY N/A 05/06/2013   Procedure: LAPAROSCOPY OPERATIVE, Left Salpingectomy, Removal Ectopic Pregnancy;  Surgeon: Mickel Baas, MD;  Location: [facility];  Service: Gynecology;  Laterality: N/A;  . WISDOM TOOTH EXTRACTION        Allergies Patient has no known allergies.  Family History  Problem Relation Age of Onset  . Psoriasis Sister     Social History Social History   Tobacco Use  . Smoking status: Former Smoker    Packs/day: 0.50    Types: Cigarettes    Quit date: 08/28/2001    Years since quitting: 18.2  . Smokeless tobacco: Never Used  Substance Use Topics  . Alcohol use: No  . Drug use: No    Review of Systems  All other systems negative except as documented in the HPI. All pertinent positives and negatives as reviewed in the HPI. ____________________________________________   PHYSICAL EXAM:  VITAL SIGNS: ED Triage Vitals  Enc Vitals Group     BP 11/18/19 2205 104/66     Pulse Rate 11/18/19 2205 99     Resp 11/18/19 2205 18     Temp 11/18/19 2205 99.6 F (37.6 C)     Temp Source 11/18/19 2205 Oral     SpO2 11/18/19 2205 99 %  Weight 11/18/19 2204 132 lb (59.9 kg)     Height 11/18/19 2204 5\' 6"  (1.676 m)    Constitutional: Alert and oriented. Ill appearing but in no acute distress. Eyes: Conjunctivae are normal. PERRL. EOMI. Head: Atraumatic. Nose: No congestion/rhinnorhea. Mouth/Throat: Mucous membranes are moist.  Oropharynx non-erythematous. Neck: No stridor.  No meningeal signs.   Cardiovascular: Normal rate, regular rhythm. Good peripheral circulation. Grossly normal heart sounds.   Respiratory: Normal respiratory effort.  No retractions. Lungs CTAB. Gastrointestinal: Soft and nontender. No distention.  Musculoskeletal: No lower extremity tenderness  nor edema. No gross deformities of extremities. Neurologic:  Normal speech and language. No gross focal neurologic deficits are appreciated.  Skin:  Skin is warm, dry and intact. No rash noted.  ____________________________________________   LABS (all labs ordered are listed, but only abnormal results are displayed)  Labs Reviewed  LACTIC ACID, PLASMA - Abnormal; Notable for the following components:      Result Value   Lactic Acid, Venous 2.0 (*)    All other components within normal limits  COMPREHENSIVE METABOLIC PANEL - Abnormal; Notable for the following components:   Glucose, Bld 119 (*)    Calcium 8.4 (*)    Total Protein 6.2 (*)    Albumin 3.2 (*)    AST 14 (*)    All other components within normal limits  CBC WITH DIFFERENTIAL/PLATELET - Abnormal; Notable for the following components:   WBC 11.2 (*)    Hemoglobin 11.6 (*)    HCT 35.1 (*)    Platelets 403 (*)    Neutro Abs 8.4 (*)    Abs Immature Granulocytes 0.33 (*)    All other components within normal limits  URINALYSIS, ROUTINE W REFLEX MICROSCOPIC - Abnormal; Notable for the following components:   APPearance CLOUDY (*)    Hgb urine dipstick SMALL (*)    Bacteria, UA RARE (*)    All other components within normal limits  SARS CORONAVIRUS 2 (TAT 6-24 HRS)  LACTIC ACID, PLASMA  TSH  I-STAT BETA HCG BLOOD, ED ([facility], [facility], [facility] ONLY)   ____________________________________________  EKG   EKG Interpretation  Date/Time:  Wednesday November 19 2019 02:26:41 EST Ventricular Rate:  51 PR Interval:    QRS Duration: 107 QT Interval:  487 QTC Calculation: 449 R Axis:   60 Text Interpretation: Sinus rhythm Short PR interval When compared with ECG of EARLIER SAME DATE No significant change was found Confirmed by 10-21-2005 (Dione Booze) on 11/19/2019 3:47:26 AM       ____________________________________________  RADIOLOGY  DG Chest Portable 1 View  Result Date: 11/18/2019 CLINICAL DATA:  Infection, recent discharge  for bacterial meningitis EXAM: PORTABLE CHEST 1 VIEW COMPARISON:  Radiograph 09/23/2015 FINDINGS: Left upper extremity PICC terminates at the mid SVC. No consolidation, features of edema, pneumothorax, or effusion. Pulmonary vascularity is normally distributed. The cardiomediastinal contours are unremarkable. No acute osseous or soft tissue abnormality. IMPRESSION: No acute cardiopulmonary abnormality. Electronically Signed   By: 14/10/2014 M.D.   On: 11/18/2019 22:46    ____________________________________________   PROCEDURES  Procedure(s) performed:   Procedures   ____________________________________________   INITIAL IMPRESSION / ASSESSMENT AND PLAN / ED COURSE  Patient sniffily nauseous but her abdomen is benign her vital signs here within normal limits aside from a slightly soft pressure.  She does appear dehydrated on exam and is generally unwell..  This 69 medication for repeat lumbar puncture she is already had 2 and is being treated with antibiotics for the last  4 days.  I think the patient likely needs some fluids and antiemetics and will reevaluate for disposition.  She may need to be admitted for further hydration if continues to be orthostatic or unable to tolerate p.o. will order home abx since she will likely be here when they are due.   Persistently nauseous, not even wanting to try to eat. Not making urine even with multiple liters of fluid. Orthostatic after fluids, will need further care with fluids and antiemetics, will bring in hospital for same.   Pertinent labs & imaging results that were available during my care of the patient were reviewed by me and considered in my medical decision making (see chart for details).  A medical screening exam was performed and I feel the patient has had an appropriate workup for their chief complaint at this time and likelihood of emergent condition existing is low. They have been counseled on decision, discharge, follow up and which  symptoms necessitate immediate return to the emergency department. They or their family verbally stated understanding and agreement with plan and discharged in stable condition.   ____________________________________________  FINAL CLINICAL IMPRESSION(S) / ED DIAGNOSES  Final diagnoses:  Dehydration  Diarrhea, unspecified type  Meningitis     MEDICATIONS GIVEN DURING THIS VISIT:  Medications  sodium chloride flush (NS) 0.9 % injection 3 mL (3 mLs Intravenous Not Given 11/19/19 0429)  0.9 %  sodium chloride infusion ( Intravenous New Bag/Given 11/19/19 0552)  cefTRIAXone (ROCEPHIN) 2 g in sodium chloride 0.9 % 100 mL IVPB (has no administration in time range)  ondansetron (ZOFRAN) injection 4 mg (has no administration in time range)  promethazine (PHENERGAN) injection 12.5 mg (has no administration in time range)  acetaminophen (TYLENOL) tablet 650 mg (has no administration in time range)  morphine 2 MG/ML injection 2-4 mg (has no administration in time range)  sodium chloride flush (NS) 0.9 % injection 3 mL (has no administration in time range)  vancomycin (VANCOREADY) IVPB 1250 mg/250 mL (has no administration in time range)  0.9 %  sodium chloride infusion (has no administration in time range)  lactated ringers bolus 2,000 mL (0 mLs Intravenous Stopped 11/19/19 0232)  ondansetron (ZOFRAN) 8 mg in sodium chloride 0.9 % 50 mL IVPB (0 mg Intravenous Stopped 11/19/19 0232)  cefTRIAXone (ROCEPHIN) 2 g in sodium chloride 0.9 % 100 mL IVPB (0 g Intravenous Stopped 11/19/19 0305)  linezolid (ZYVOX) IVPB 600 mg (0 mg Intravenous Stopped 11/19/19 0552)  prochlorperazine (COMPAZINE) injection 10 mg (10 mg Intravenous Given 11/19/19 0413)  ketorolac (TORADOL) 30 MG/ML injection 15 mg (15 mg Intravenous Given 11/19/19 0410)  lactated ringers bolus 1,000 mL (1,000 mLs Intravenous New Bag/Given 11/19/19 0414)     NEW OUTPATIENT MEDICATIONS STARTED DURING THIS VISIT:  Current Discharge Medication List        Note:  This note was prepared with assistance of Dragon voice recognition software. Occasional wrong-word or sound-a-like substitutions may have occurred due to the inherent limitations of voice recognition software.  Wesson Stith, Corene Cornea, MD 11/19/19 [phone]

## 2019-11-19 NOTE — Progress Notes (Signed)
Spoke with [employer] for the second time. Explaining to her that the MD on call is unable to give her a call back. Dawn [name] stated that she has a Neurosurgeon friend that have read the patient's chart and there is a note stating that she needed to be on prednisone. Explained to [facility] that there is not a note on her chart that she is to be started on prednisone tonight.  Dawn [name] got upset and wanted the MD on call to call her.   AC notified of above info. Will continue to monitor.

## 2019-11-19 NOTE — Telephone Encounter (Signed)
I returned call and spoke with [employer].  He states that she passed out yesterday at home while taking a shower.  He called EMS.  She states her vitals were stable but she was not very coherent.  He decided to take her to [facility].  He stated that she did not hit her head when she fell.  She was evaluated by Dr. Luciana Axe today who believes that she has viral meningitis and she is having withdrawal from stopping steroids.  I have advised him to contact us if he needs any updates.

## 2019-11-19 NOTE — Progress Notes (Signed)
Misty called again upset. Have explained to her that we can not do anything for her. Have given her AC number to call.

## 2019-11-19 NOTE — Progress Notes (Signed)
PT Cancellation Note  Patient Details Name: Dawn [name] MRN: [id number] DOB: 13-Aug-1981   Cancelled Treatment:    Reason Eval/Treat Not Completed: Medical issues which prohibited therapy.  Pt is still having a severe HA, nsg requested PT to try at another time, tomorrow.  See as time and pt allow, checking in tomorrow.   Ivar Drape 11/19/2019, 2:57 PM   Samul Dada, PT MS Acute Rehab Dept. Number: [facility] [id number] and [facility] [phone]

## 2019-11-19 NOTE — Consult Note (Signed)
[facility] for Infectious Disease       Reason for Consult: meningitis    Referring Physician: Dr. Rodena Piety  Principal Problem:   Meningitis Active Problems:   Rheumatoid arthritis with rheumatoid factor of multiple sites without organ or systems involvement ([place])   Syncope   . Chlorhexidine Gluconate Cloth  6 each Topical Daily  . feeding supplement  1 Container Oral TID BM  . multivitamin with minerals  1 tablet Oral Daily  . sodium chloride flush  3 mL Intravenous Once  . sodium chloride flush  3 mL Intravenous Q12H    Recommendations: Continue with empiric antibiotics Consider anti inflammatories such as Toradol for symptom relief Continue other supportive care Droplet isolation not indicated  No indication for repeat LP  Assessment: She has known meningitis with LP x 2 at [facility].  Intiall lumbar puncture with elevated WBCs to 873 with a normal glucose, though elevated protein level.  She was placed on empiric antibiotics but all cultures remained negative.  A repeat LP was done and more c/w viral etiology with again a normal glucose level and protein level.  Was then a lymphocytic predominance.   She also received steroids during the hospitalization while ruling out meningococcal infection.  CSF panel was negative.  She most likely had a rebound of her symptoms after stopping the inpatient steroids and going home with no antiinflammatory medications.  He symptoms of photophobia, headache and feeling poorly are still c/w viral meningitis.   Antibiotics: linezolid and ceftriaxone   HPI: Jermeka Schlotterbeck is a 39 y.o. female with history of rheumatoid arthritis on Orencia who initially presented to [facility] with 5 days of fever, headache and photophobia.  Work up as above was significant for CSF with 800 WBCs, normal glucose and elevated protein, with a neutrophilic predominance.  Cultures remained negative and CSF panel negative.  She had a repeat  LP with 200 WBCs, normal glucose, normal protein and lymphocytic predominace but with unknown etiology, she was discharged with IV therapy. Since her vancomycin troughs remained subtherapeutic, she was sent on linezolid and IV ceftriaxone. She also had been on IV steroids during her hospitalization but was not discharged on any.  She initially felt well but then again with headache, photophobia and a syncopal episode.  Tmax 100.4 here.   reviewed record at [facility] and partially summarized above.   Review of Systems:  Constitutional: negative for fevers and chills Gastrointestinal: negative for diarrhea Neurological: positive for headaches and dizziness All other systems reviewed and are negative    Past Medical History:  Diagnosis Date  . Arthritis   . H/O laparoscopy 05/05/13  . History of ectopic pregnancy 05/05/13  . Hx of unilateral salpingectomy 05/05/13   Left  . Hx of wisdom tooth extraction   . Medical history non-contributory     Social History   Tobacco Use  . Smoking status: Former Smoker    Packs/day: 0.50    Types: Cigarettes    Quit date: 08/28/2001    Years since quitting: 18.2  . Smokeless tobacco: Never Used  Substance Use Topics  . Alcohol use: No  . Drug use: No    Family History  Problem Relation Age of Onset  . Psoriasis Sister     No Known Allergies  Physical Exam: Constitutional: in no apparent distress  Vitals:   11/19/19 0937 11/19/19 1442  BP:    Pulse:    Resp: 18   Temp: (!) 100.4 F (38  C) 98.2 F (36.8 C)  SpO2: 100%    EYES: anicteric Cardiovascular: Cor RRR Respiratory: clear; Musculoskeletal: no pedal edema noted Skin: negatives: no rash   Lab Results  Component Value Date   WBC 11.2 (H) 11/18/2019   HGB 11.6 (L) 11/18/2019   HCT 35.1 (L) 11/18/2019   MCV 82.4 11/18/2019   PLT 403 (H) 11/18/2019    Lab Results  Component Value Date   CREATININE 0.52 11/18/2019   BUN 12 11/18/2019   NA 140 11/18/2019   K 3.5  11/18/2019   CL 107 11/18/2019   CO2 22 11/18/2019    Lab Results  Component Value Date   ALT 11 11/18/2019   AST 14 (L) 11/18/2019   ALKPHOS 69 11/18/2019     Microbiology: Recent Results (from the past 240 hour(s))  SARS CORONAVIRUS 2 (TAT 6-24 HRS) Nasopharyngeal Nasopharyngeal Swab     Status: None   Collection Time: 11/19/19  6:01 AM   Specimen: Nasopharyngeal Swab  Result Value Ref Range Status   SARS Coronavirus 2 NEGATIVE NEGATIVE Final    Comment: (NOTE) SARS-CoV-2 target nucleic acids are NOT DETECTED. The SARS-CoV-2 RNA is generally detectable in upper and lower respiratory specimens during the acute phase of infection. Negative results do not preclude SARS-CoV-2 infection, do not rule out co-infections with other pathogens, and should not be used as the sole basis for treatment or other patient management decisions. Negative results must be combined with clinical observations, patient history, and epidemiological information. The expected result is Negative. Fact Sheet for Patients: HairSlick.no Fact Sheet for Healthcare Providers: quierodirigir.com This test is not yet approved or cleared by the [place] FDA and  has been authorized for detection and/or diagnosis of SARS-CoV-2 by FDA under an Emergency Use Authorization (EUA). This EUA will remain  in effect (meaning this test can be used) for the duration of the COVID-19 declaration under Section 56 4(b)(1) of the Act, 21 U.S.C. section 360bbb-3(b)(1), unless the authorization is terminated or revoked sooner. Performed at [facility], [address] [place], [place] [address]     Gardiner Barefoot, MD [facility] for Infectious Disease [facility] [email or web address] 11/19/2019, 3:38 PM

## 2019-11-19 NOTE — Progress Notes (Signed)
Spoke with pt friend Hydrologist), per family and patient request. Lanice Schwab had questions about pt starting predisone. RN looked through pt chart, saw no new orders to start predisone tonight and saw no information in the chart about starting this medication.  Advised that RN would contact the on call provider Katherina Right). Katherina Right, NP advised that she saw no information in the chart about starting the predisone.  No new orders were given. Misty called back, RN advised her that no new orders were added and that there are no new orders in the [facility] to start predisone tonight. RN advised Misty that the husband would need to speak with the pt primary MD in the morning. Misty became upset and asked who was in charge. Notified charge nurse of above information and gave the phone to charge nurse.

## 2019-11-19 NOTE — Progress Notes (Signed)
PT Cancellation Note  Patient Details Name: Dawn [name] MRN: [id number] DOB: 09/22/1981   Cancelled Treatment:    Reason Eval/Treat Not Completed: Medical issues which prohibited therapy.  HA and nausea are ongoing, with nursing working with pt to help control it.  Will retry at another time as pt can allow.   Ivar Drape 11/19/2019, 12:55 PM   Samul Dada, PT MS Acute Rehab Dept. Number: [facility] [id number] and [facility] [phone]

## 2019-11-19 NOTE — Progress Notes (Signed)
Initial Nutrition Assessment  DOCUMENTATION CODES:   Not applicable  INTERVENTION:    Boost Breeze po TID, each supplement provides 250 kcal and 9 grams of protein  MVI daily   NUTRITION DIAGNOSIS:   Inadequate oral intake related to nausea as evidenced by per patient/family report.  GOAL:   Patient will meet greater than or equal to 90% of their needs  MONITOR:   PO intake, Supplement acceptance, Diet advancement, Weight trends, Labs, I & O's  REASON FOR ASSESSMENT:   Malnutrition Screening Tool    ASSESSMENT:   Patient with PMH significant for RA on Orencia. Recently admitted at [facility] for suspected meningitis and was sent home with PICC/antibiotics. Presents this admission with worsening headache, neck pain, and nausea.  RD working remotely.  Unable to reach pt by phone. Per H&P intake PTA declined due to ongoing nausea. She hasn't had much by mouth this admission. RD to provide supplementation to maximize kcal and protein this admission.   Records indicate pt weighed 132 lb at [facility] on 1/23 and shows a stated weight of 132 lb this admission. Will need to obtain actual weight recording to assess for weight loss.   Medications: reviewed  Labs: CBG 119   Diet Order:   Diet Order            Diet clear liquid Room service appropriate? Yes; Fluid consistency: Thin  Diet effective now              EDUCATION NEEDS:   Not appropriate for education at this time  Skin:  Skin Assessment: Reviewed RN Assessment  Last BM:  1/26  Height:   Ht Readings from Last 1 Encounters:  11/18/19 5\' 6"  (1.676 m)    Weight:   Wt Readings from Last 1 Encounters:  11/18/19 59.9 kg    Ideal Body Weight:  59.1 kg  BMI:  Body mass index is 21.31 kg/m.  Estimated Nutritional Needs:   Kcal:  1800-2000 kcal  Protein:  90-105 grams  Fluid:  >/= 1.8 L/day   11/20/19 RD, LDN Clinical Nutrition Pager # - [phone]

## 2019-11-19 NOTE — Progress Notes (Signed)
New Admission Note:  Arrival Method: Via stretcher from ED to 55m19 Mental Orientation: Alert & Oriented x4 Telemetry: CCMD verified Assessment: Completed Skin: Refer to flowsheet IV: Right AC/ LUE PICC Pain: 0/10 Safety Measures: Safety Fall Prevention Plan discussed with patient. Admission: Completed 5 [place] Orientation: Patient has been orientated to the room, unit and the staff.  Orders have been reviewed and are being implemented. Will continue to monitor the patient. Call light has been placed within reach and bed alarm has been activated.   Aram Candela, RN  Phone Number: [phone]

## 2019-11-19 NOTE — H&P (Signed)
History and Physical    Dawn [name] UDJ:[id number] DOB: October 28, 1980 DOA: 11/18/2019  PCP: Don Broach, [facility]   Patient coming from: Home   Chief Complaint: Headache, nausea, loose stools, syncope   HPI: Dawn [name] is a 39 y.o. female with medical history significant for rheumatoid arthritis on Orencia, recent admission with suspected meningitis, now presenting to the emergency department for evaluation of headache, nausea, loose stools, and syncope.  Patient reports that she developed headache and nausea on 11/08/2019, went on to have fevers and neck pain as well, was admitted to [facility] in [place], had LP that was felt to be concerning for possible bacterial meningitis, she was treated with Decadron, ampicillin, Rocephin, and vancomycin, and admitted to the hospital there.  It does not appear that any organisms were seen in the CSF, no positive cultures, and viral panels were all negative.  She had PICC placed and was discharged yesterday with plan to continue Rocephin and linezolid.  Since returning home, she has continued headaches and nausea, was seated on the toilet having loose stools, and then suffered a transient loss of consciousness without any chest pain.  Patient denies any focal numbness or weakness and there has not been any seizure-like activity.  She reports having a fever again since returning home from the hospital yesterday.  Denies respiratory symptoms.  She reports multiple negative COVID-19 tests since onset of her symptoms.  ED Course: Upon arrival to the ED, patient is found to be afebrile, saturating well on room air, bradycardic in the 50s, and with blood pressure 104/66.  Orthostatic vital signs are negative.  EKG with sinus bradycardia, rate 51.  Chest x-ray is negative for acute cardiopulmonary disease.  Chemistry panel is unremarkable.  CBC with leukocytosis to 11,200, mild normocytic anemia, and mild thrombocytosis.  Initial lactic acid was  2.0.  COVID-19 screening test not yet resulted.  Patient was treated with 3 L of IV fluids, Rocephin, linezolid, Toradol, Zofran, and Compazine in the ED.  Review of Systems:  All other systems reviewed and apart from HPI, are negative.  Past Medical History:  Diagnosis Date  . Arthritis   . H/O laparoscopy 05/05/13  . History of ectopic pregnancy 05/05/13  . Hx of unilateral salpingectomy 05/05/13   Left  . Hx of wisdom tooth extraction   . Medical history non-contributory     Past Surgical History:  Procedure Laterality Date  . LAPAROSCOPY N/A 05/06/2013   Procedure: LAPAROSCOPY OPERATIVE, Left Salpingectomy, Removal Ectopic Pregnancy;  Surgeon: Mickel Baas, MD;  Location: [facility];  Service: Gynecology;  Laterality: N/A;  . WISDOM TOOTH EXTRACTION       reports that she quit smoking about 18 years ago. Her smoking use included cigarettes. She smoked 0.50 packs per day. She has never used smokeless tobacco. She reports that she does not drink alcohol or use drugs.  No Known Allergies  Family History  Problem Relation Age of Onset  . Psoriasis Sister      Prior to Admission medications   Medication Sig Start Date End Date Taking? Authorizing Provider  Abatacept (ORENCIA IV) Inject into the vein every 30 (thirty) days.    Yes [provider]  cefTRIAXone (ROCEPHIN) 2 g injection Inject 2 g into the muscle every 12 (twelve) hours.    [provider]  linezolid (ZYVOX) 600 MG tablet Take 600 mg by mouth 2 (two) times daily.    [provider]  promethazine (PHENERGAN) 12.5 MG tablet Take  12.5 mg by mouth every 6 (six) hours as needed for nausea or vomiting.    [provider]    Physical Exam: Vitals:   11/19/19 0400 11/19/19 0426 11/19/19 0430 11/19/19 0507  BP: (!) 109/57  (!) 107/59 (!) 101/58  Pulse: (!) 51  (!) 48 (!) 48  Resp: 17  17 16   Temp:  99.2 F (37.3 C)  98.9 F (37.2 C)  TempSrc:  Oral  Oral  SpO2: 98%  97% 98%    Weight:      Height:        Constitutional: NAD, calm  Eyes: PERTLA, lids and conjunctivae normal ENMT: Mucous membranes are moist. Posterior pharynx clear of any exudate or lesions.   Neck: normal, supple, no masses, no thyromegaly Respiratory: clear to auscultation bilaterally, no wheezing, no crackles. No accessory muscle use.  Cardiovascular: S1 & S2 heard, regular rate and rhythm. No extremity edema.   Abdomen: No distension, no tenderness, soft. Bowel sounds active.  Musculoskeletal: no clubbing / cyanosis. No joint deformity upper and lower extremities.   Skin: no significant rashes, lesions, ulcers. Warm, dry, well-perfused. Neurologic: CN 2-12 grossly intact. Sensation intact. Strength 5/5 in all 4 limbs.  Psychiatric: Sleeping, wakes to voice. Oriented to person, place, and situation. Calm, cooperative.    Labs and Imaging on Admission: I have personally reviewed following labs and imaging studies  CBC: Recent Labs  Lab 11/18/19 2214  WBC 11.2*  NEUTROABS 8.4*  HGB 11.6*  HCT 35.1*  MCV 82.4  PLT 403*   Basic Metabolic Panel: Recent Labs  Lab 11/18/19 2214  NA 140  K 3.5  CL 107  CO2 22  GLUCOSE 119*  BUN 12  CREATININE 0.52  CALCIUM 8.4*   GFR: Estimated Creatinine Clearance: 88.4 mL/min (by C-G formula based on SCr of 0.52 mg/dL). Liver Function Tests: Recent Labs  Lab 11/18/19 2214  AST 14*  ALT 11  ALKPHOS 69  BILITOT 0.6  PROT 6.2*  ALBUMIN 3.2*   No results for input(s): LIPASE, AMYLASE in the last 168 hours. No results for input(s): AMMONIA in the last 168 hours. Coagulation Profile: No results for input(s): INR, PROTIME in the last 168 hours. Cardiac Enzymes: No results for input(s): CKTOTAL, CKMB, CKMBINDEX, TROPONINI in the last 168 hours. BNP (last 3 results) No results for input(s): PROBNP in the last 8760 hours. HbA1C: No results for input(s): HGBA1C in the last 72 hours. CBG: No results for input(s): GLUCAP in the last 168  hours. Lipid Profile: No results for input(s): CHOL, HDL, LDLCALC, TRIG, CHOLHDL, LDLDIRECT in the last 72 hours. Thyroid Function Tests: No results for input(s): TSH, T4TOTAL, FREET4, T3FREE, THYROIDAB in the last 72 hours. Anemia Panel: No results for input(s): VITAMINB12, FOLATE, FERRITIN, TIBC, IRON, RETICCTPCT in the last 72 hours. Urine analysis:    Component Value Date/Time   COLORURINE YELLOW 11/18/2019 2206   APPEARANCEUR CLOUDY (A) 11/18/2019 2206   LABSPEC 1.028 11/18/2019 2206   PHURINE 7.0 11/18/2019 2206   GLUCOSEU NEGATIVE 11/18/2019 2206   HGBUR SMALL (A) 11/18/2019 2206   BILIRUBINUR NEGATIVE 11/18/2019 2206   KETONESUR NEGATIVE 11/18/2019 2206   PROTEINUR NEGATIVE 11/18/2019 2206   NITRITE NEGATIVE 11/18/2019 2206   LEUKOCYTESUR NEGATIVE 11/18/2019 2206   Sepsis Labs: @LABRCNTIP (procalcitonin:4,lacticidven:4) )No results found for this or any previous visit (from the past 240 hour(s)).   Radiological Exams on Admission: DG Chest Portable 1 View  Result Date: 11/18/2019 CLINICAL DATA:  Infection, recent discharge for bacterial  meningitis EXAM: PORTABLE CHEST 1 VIEW COMPARISON:  Radiograph 09/23/2015 FINDINGS: Left upper extremity PICC terminates at the mid SVC. No consolidation, features of edema, pneumothorax, or effusion. Pulmonary vascularity is normally distributed. The cardiomediastinal contours are unremarkable. No acute osseous or soft tissue abnormality. IMPRESSION: No acute cardiopulmonary abnormality. Electronically Signed   By: Lovena Le M.D.   On: 11/18/2019 22:46    EKG: Independently reviewed. Sinus bradycardia, rate 51, QTc 449 ms.   Assessment/Plan   1. Meningitis  - Patient developed HA, neck pain, and nausea on 11/08/19 and was admitted to [facility] on 11/15/19 with LP concerning for bacterial meningitis though does not appear that any organism was seen or any positive culture; viral panels were also negative  - She was treated  with Decadron at time of recent admission and started on IV antibiotics, had PICC placed with plan for continued outpatient treatment, but now presents with ongoing headache, fatigue, nausea, and loose stools  - She was given IVF in ED and treated with antiemetics, Rocephin, and linezolid  - Continue antibiotics with Rocephin and vancomycin, place on droplet precautions, continue supportive care with analgesics and antiemetics    2. Rheumatoid arthritis  - Managed with Orencia, rheumatologist planning to hold until infection cleared   3. Sinus bradycardia - HR in 50's in ED and dipping to 40's at times, hemodynamically stable in ED but had syncopal episode just pta, sinus rhythm confirmed on EKG  - Continue cardiac monitoring for now, check echocardiogram, TSH    4. Syncope  - Patient reports having loose stools and then syncope on the toilet shortly pta  - Orthostatic vitals negative in ED - EKG with sinus bradycardia, normal intervals, no blocks  - Continue cardiac monitoring, check echocardiogram    DVT prophylaxis: SCD's  Code Status: Full  Family Communication: Discussed with patient  Consults called: None  Admission status: Observation    Vianne Bulls, MD [facility] Pager: See [email or web address]  If 7AM-7PM, please contact the daytime attending [email or web address]  11/19/2019, 5:38 AM

## 2019-11-19 NOTE — Progress Notes (Signed)
Pharmacy Antibiotic Note  Dawn [name] is a 39 y.o. female admitted on 11/18/2019 with meningitis.  Pharmacy has been consulted for vancomycin dosing.  Of note pt was admitted to [facility] 1/23-26 and was treated with vancomycin, cefepime>ceftriaxone, ampicillin, and dexamethasone, was discharged on ceftriaxone IV and linezolid PO but returned to [facility] prior to starting any of the discharge medications; plan was to continue ABX through 12/01/19.  Of note, vanc dose at OSH was 1250mg  IV Q12H >> vanc level of 5.8 >> dose changed to 1250mg  IV Q8H; the timing of these doses and labs is unclear but the dose change seems reasonable.  Plan: Rec'd linezolid 600mg  IV x1 in ED. Resume vancomycin 1250mg  IV Q8H; monitor trough for goal 15-20. Also resumed ceftriaxone 2g IV Q12H appropriately per MD.  Height: 5\' 6"  (167.6 cm) Weight: 132 lb (59.9 kg) IBW/kg (Calculated) : 59.3  Temp (24hrs), Avg:99.1 F (37.3 C), Min:98.8 F (37.1 C), Max:99.6 F (37.6 C)  Recent Labs  Lab 11/18/19 2214 11/19/19 0241  WBC 11.2*  --   CREATININE 0.52  --   LATICACIDVEN 2.0* 1.5    Estimated Creatinine Clearance: 88.4 mL/min (by C-G formula based on SCr of 0.52 mg/dL).    No Known Allergies   Thank you for allowing pharmacy to be a part of this patient's care.  , PharmD, BCPS  11/19/2019 5:42 AM

## 2019-11-19 NOTE — Telephone Encounter (Signed)
[name] states Dawn [name] was discharged form [facility] 11/18/19. [name] states she was home for about an hour and she passed out in the bathroom. Patient is now admitted to [facility]. She is having fever and headache. She is currently receiving antibiotics and fluids. [name] states Dawn [name] is incoherent. He states Dr. Luciana Axe from ID has come to see [name].

## 2019-11-19 NOTE — Progress Notes (Signed)
  Echocardiogram 2D Echocardiogram has been performed.  Leta Jungling M 11/19/2019, 9:41 AM

## 2019-11-19 NOTE — Progress Notes (Addendum)
Spoke with husband on a 3 way with Mist[facility]. Told them will call MD on call for a consult for Dr.  Coletta Memos.

## 2019-11-19 NOTE — Consult Note (Addendum)
BP (!) 112/57 (BP Location: Right Arm)   Pulse 79   Temp 99.4 F (37.4 C)   Resp 14   Ht 5\' 6"  (1.676 m)   Wt 59.9 kg   SpO2 95%   BMI 21.31 kg/m  Asked by Husband to see Dawn [name] secondary to lethargy and diagnosis of meningitis. Dawn [name] was initially admitted to [facility] with approximately 5 day history of headaches, diarrhea, nausea. Family thought she was dealing with coronavirus-19 which accounted for the delay in taking her to hospital. She is negative for covid on three tests since 11/08/2019. She was febrile, and reported neck pain. She underwent two lumbar punctures as characterized in Dr. Henreitta Leber note. The ID team believes her case is most consistent with a viral meningitis. Dr. Linus Salmons also believed her readmission after being discharged from [facility] was possibly due to an abrupt stop in steroid treatment which she was receiving prior to admission at [facility]. Head CT today was normal.  No Known Allergies Past Medical History:  Diagnosis Date  . Arthritis   . H/O laparoscopy 05/05/13  . History of ectopic pregnancy 05/05/13  . Hx of unilateral salpingectomy 05/05/13   Left  . Hx of wisdom tooth extraction   . Medical history non-contributory    Past Surgical History:  Procedure Laterality Date  . LAPAROSCOPY N/A 05/06/2013   Procedure: LAPAROSCOPY OPERATIVE, Left Salpingectomy, Removal Ectopic Pregnancy;  Surgeon: Sharene Butters, MD;  Location: [place] [facility];  Service: Gynecology;  Laterality: N/A;  . WISDOM TOOTH EXTRACTION     Family History  Problem Relation Age of Onset  . Psoriasis Sister    Social History   Socioeconomic History  . Marital status: Married    Spouse name: Not on file  . Number of children: Not on file  . Years of education: Not on file  . Highest education level: Not on file  Occupational History  . Not on file  Tobacco Use  . Smoking status: Former Smoker    Packs/day: 0.50    Types: Cigarettes    Quit date: 08/28/2001    Years  since quitting: 18.2  . Smokeless tobacco: Never Used  Substance and Sexual Activity  . Alcohol use: No  . Drug use: No  . Sexual activity: Yes    Birth control/protection: None  Other Topics Concern  . Not on file  Social History Narrative  . Not on file   Social Determinants of Health   Financial Resource Strain:   . Difficulty of Paying Living Expenses: Not on file  Food Insecurity:   . Worried About Charity fundraiser in the Last Year: Not on file  . Ran Out of Food in the Last Year: Not on file  Transportation Needs:   . Lack of Transportation (Medical): Not on file  . Lack of Transportation (Non-Medical): Not on file  Physical Activity:   . Days of Exercise per Week: Not on file  . Minutes of Exercise per Session: Not on file  Stress:   . Feeling of Stress : Not on file  Social Connections:   . Frequency of Communication with Friends and Family: Not on file  . Frequency of Social Gatherings with Friends and Family: Not on file  . Attends Religious Services: Not on file  . Active Member of Clubs or Organizations: Not on file  . Attends Archivist Meetings: Not on file  . Marital Status: Not on file  Intimate Partner Violence:   .  Fear of Current or Ex-Partner: Not on file  . Emotionally Abused: Not on file  . Physically Abused: Not on file  . Sexually Abused: Not on file   Prior to Admission medications   Medication Sig Start Date End Date Taking? Authorizing Provider  Abatacept (ORENCIA IV) Inject into the vein every 30 (thirty) days.    Yes [provider]  cefTRIAXone (ROCEPHIN) 2 g injection Inject 2 g into the muscle every 12 (twelve) hours.    [provider]  linezolid (ZYVOX) 600 MG tablet Take 600 mg by mouth 2 (two) times daily.    [provider]  promethazine (PHENERGAN) 12.5 MG tablet Take 12.5 mg by mouth every 6 (six) hours as needed for nausea or vomiting.    [provider]  Physical Exam Neurological:      Mental Status: She is lethargic.     Motor: Motor function is intact.     Coordination: Coordination is intact.     Deep Tendon Reflexes: Babinski sign absent on the right side. Babinski sign absent on the left side.     Comments: Arouses to voice. She is fluent, clear Perrl, full eom Moving all extremities with normal strength Did not perform detailed sensory exam.  Coordination is intact.  Gait nor Romberg assessed.  Tongue uvula midline Symmetric smile Does not know birthday, confused at times, not fully aware of situation    A/P Dawn [name] is a 39 y.o. female with a probable viral meningitis. She is more alert at this time than earlier when I spoke with her husband just after CT was finished. It is not clear whether steroids were to be initiated, there may be reasons I am unable to discern from the notes. I do not know if Dr. Luciana Axe and the primary service have communicated directly, thus I do not have complete knowledge about starting steroids.  Dawn [name]'s level of consciousness is improved. It may have been impaired by narcotics administered to help with her pain earlier in the day. There is no question it is significantly better now.  She remains on ABX.

## 2019-11-19 NOTE — Telephone Encounter (Signed)
Patient's husband Dawn [name] called stating Dawn [name] had been in [facility] for the last couple of days and was discharged last night.  Dawn [name] states she was only home for 1 1/2 hours and passed out in the bathroom.  Dawn [name] states he drove her to [facility] and she was admitted.  Brad stated Dr. Corliss Skains talked to [facility] about having the infectious disease doctor get in touch with her.  Dawn [name] states she is not doing well in the hospital and is experiencing severe headaches and requesting a return call to talk with someone.  Please call back at #[phone]

## 2019-11-20 ENCOUNTER — Telehealth: Payer: Self-pay | Admitting: Rheumatology

## 2019-11-20 DIAGNOSIS — R630 Anorexia: Secondary | ICD-10-CM

## 2019-11-20 DIAGNOSIS — R5381 Other malaise: Secondary | ICD-10-CM

## 2019-11-20 DIAGNOSIS — R509 Fever, unspecified: Secondary | ICD-10-CM

## 2019-11-20 DIAGNOSIS — R5383 Other fatigue: Secondary | ICD-10-CM

## 2019-11-20 DIAGNOSIS — M069 Rheumatoid arthritis, unspecified: Secondary | ICD-10-CM

## 2019-11-20 DIAGNOSIS — H53149 Visual discomfort, unspecified: Secondary | ICD-10-CM

## 2019-11-20 LAB — BASIC METABOLIC PANEL
Anion gap: 9 (ref 5–15)
BUN: <5 mg/dL — ABNORMAL LOW (ref 6–20)
CO2: 23 mmol/L (ref 22–32)
Calcium: 7.8 mg/dL — ABNORMAL LOW (ref 8.9–10.3)
Chloride: 107 mmol/L (ref 98–111)
Creatinine, Ser: 0.41 mg/dL — ABNORMAL LOW (ref 0.44–1.00)
GFR calc Af Amer: >60 mL/min (ref >60)
GFR calc non Af Amer: >60 mL/min (ref >60)
Glucose, Bld: 111 mg/dL — ABNORMAL HIGH (ref 70–99)
Potassium: 3.1 mmol/L — ABNORMAL LOW (ref 3.5–5.1)
Sodium: 139 mmol/L (ref 135–145)

## 2019-11-20 LAB — CBC
HCT: 30.3 % — ABNORMAL LOW (ref 36.0–46.0)
Hemoglobin: 10.1 g/dL — ABNORMAL LOW (ref 12.0–15.0)
MCH: 27.6 pg (ref 26.0–34.0)
MCHC: 33.3 g/dL (ref 30.0–36.0)
MCV: 82.8 fL (ref 80.0–100.0)
Platelets: 263 10*3/uL (ref 150–400)
RBC: 3.66 MIL/uL — ABNORMAL LOW (ref 3.87–5.11)
RDW: 13 % (ref 11.5–15.5)
WBC: 10.7 10*3/uL — ABNORMAL HIGH (ref 4.0–10.5)
nRBC: 0 % (ref 0.0–0.2)

## 2019-11-20 LAB — MAGNESIUM: Magnesium: 1.8 mg/dL (ref 1.7–2.4)

## 2019-11-20 MED ORDER — POTASSIUM CHLORIDE 10 MEQ/100ML IV SOLN
10.0000 meq | INTRAVENOUS | Status: AC
  Administered 2019-11-20 (×3): 10 meq via INTRAVENOUS
  Filled 2019-11-20 (×3): qty 100

## 2019-11-20 NOTE — TOC Initial Note (Signed)
Transition of Care [facility]) - Initial/Assessment Note    Patient Details  Name: Dawn [name] MRN: [id number] Date of Birth: [birth year]  Transition of [facility] CM/SW Contact:    Bess Kinds, RN Phone Number: [phone] 11/20/2019, 2:14 PM  Clinical Narrative:                 Received call from Pam with [employer]. Patient is active for IV infusions and using [facility]. Will need HH orders for RN for resumption of services.   Spoke with patient at bedside. PTA home with spouse and kids. Independent. Has transport home when ready Discussed [employer] will be following up at home for IV antibiotics.   TOC following.   Expected Discharge Plan: Home w Home Health Services Barriers to Discharge: Continued Medical Work up   Patient Goals and CMS Choice Patient states their goals for this hospitalization and ongoing recovery are:: home with family CMS Medicare.gov Compare Post Acute Care list provided to:: Patient Choice offered to / list presented to : Patient  Expected Discharge Plan and Services Expected Discharge Plan: Home w Home Health Services In-house Referral: NA Discharge Planning Services: CM Consult Post Acute Care Choice: Home Health Living arrangements for the past 2 months: Single Family Home                 DME Arranged: N/A DME Agency: NA       HH Arranged: RN HH Agency: [employer] Date HH Agency Contacted: 11/20/19 Time HH Agency Contacted: 1000 Representative spoke with at [facility] Agency: Pam  Prior Living Arrangements/Services Living arrangements for the past 2 months: Single Family Home Lives with:: Self, Minor Children, Spouse Patient language and need for interpreter reviewed:: Yes Do you feel safe going back to the place where you live?: Yes            Criminal Activity/Legal Involvement Pertinent to Current Situation/Hospitalization: No - Comment as needed  Activities of Daily Living Home Assistive Devices/Equipment: None ADL Screening (condition  at time of admission) Patient's cognitive ability adequate to safely complete daily activities?: Yes Is the patient deaf or have difficulty hearing?: No Does the patient have difficulty seeing, even when wearing glasses/contacts?: No Does the patient have difficulty concentrating, remembering, or making decisions?: No Patient able to express need for assistance with ADLs?: Yes Does the patient have difficulty dressing or bathing?: No Independently performs ADLs?: Yes (appropriate for developmental age) Does the patient have difficulty walking or climbing stairs?: No Weakness of Legs: Both Weakness of Arms/Hands: None  Permission Sought/Granted                  Emotional Assessment Appearance:: Appears stated age Attitude/Demeanor/Rapport: Engaged Affect (typically observed): Accepting Orientation: : Oriented to Self, Oriented to  Time, Oriented to Place, Oriented to Situation Alcohol / Substance Use: Not Applicable Psych Involvement: No (comment)  Admission diagnosis:  Dehydration [E86.0] Meningitis [G03.9] Diarrhea, unspecified type [R19.7] Patient Active Problem List   Diagnosis Date Noted  . Meningitis 11/19/2019  . Syncope   . Left foot pain 10/04/2018  . Sicca syndrome, unspecified (HCC) 02/21/2017  . High risk medication use 01/03/2017  . Rheumatoid arthritis with rheumatoid factor of multiple sites without organ or systems involvement (HCC) 06/26/2016  . Status post induction of labor 01/13/2015  . Term pregnancy 01/13/2015  . Spontaneous vaginal delivery 01/13/2015   PCP:  [facility] Pharmacy:   [facility] [id number] - [place], [place] - [address] AT  [place] [address] [place] [address] Phone: [phone] Fax: [phone]     Social Determinants of Health (SDOH) Interventions    Readmission Risk Interventions No flowsheet data found.

## 2019-11-20 NOTE — Progress Notes (Signed)
[facility]   Reason for visit: Follow up on probable aseptic meningitis  Interval History: overnight events reviewed.  CT head done due to lethargy and no acute concerns.  Seen by neurosurgery overnight.  Was started on toradol yesterday.  This am much more alert, less headache than previously reported.    Tmax 100.4.  Mild photophobia.  No n/v.     Physical Exam: Constitutional:  Vitals:   11/20/19 0540 11/20/19 0648  BP: 114/65   Pulse: 60   Resp: 16   Temp: (!) 100.4 F (38 C) 99.2 F (37.3 C)  SpO2: 97%    patient appears in NAD Eyes: anicteric Respiratory: Normal respiratory effort; CTA B Cardiovascular: RRR GI: soft, nt, nd Neuro: AAO x 3  Review of Systems: Constitutional: positive for fevers, fatigue, malaise and anorexia Gastrointestinal: negative for nausea, vomiting and diarrhea Integument/breast: negative for rash  Lab Results  Component Value Date   WBC 10.7 (H) 11/20/2019   HGB 10.1 (L) 11/20/2019   HCT 30.3 (L) 11/20/2019   MCV 82.8 11/20/2019   PLT 263 11/20/2019    Lab Results  Component Value Date   CREATININE 0.41 (L) 11/20/2019   BUN <5 (L) 11/20/2019   NA 139 11/20/2019   K 3.1 (L) 11/20/2019   CL 107 11/20/2019   CO2 23 11/20/2019    Lab Results  Component Value Date   ALT 11 11/18/2019   AST 14 (L) 11/18/2019   ALKPHOS 69 11/18/2019     Microbiology: Recent Results (from the past 240 hour(s))  SARS CORONAVIRUS 2 (TAT 6-24 HRS) Nasopharyngeal Nasopharyngeal Swab     Status: None   Collection Time: 11/19/19  6:01 AM   Specimen: Nasopharyngeal Swab  Result Value Ref Range Status   SARS Coronavirus 2 NEGATIVE NEGATIVE Final    Comment: (NOTE) SARS-CoV-2 target nucleic acids are NOT DETECTED. The SARS-CoV-2 RNA is generally detectable in upper and lower respiratory specimens during the acute phase of infection. Negative results do not preclude SARS-CoV-2 infection, do not rule out co-infections with  other pathogens, and should not be used as the sole basis for treatment or other patient management decisions. Negative results must be combined with clinical observations, patient history, and epidemiological information. The expected result is Negative. Fact Sheet for Patients: HairSlick.no Fact Sheet for Healthcare Providers: quierodirigir.com This test is not yet approved or cleared by the [place] FDA and  has been authorized for detection and/or diagnosis of SARS-CoV-2 by FDA under an Emergency Use Authorization (EUA). This EUA will remain  in effect (meaning this test can be used) for the duration of the COVID-19 declaration under Section 56 4(b)(1) of the Act, 21 U.S.C. section 360bbb-3(b)(1), unless the authorization is terminated or revoked sooner. Performed at [facility], [address], [place], [place] [address]   Culture, blood (routine x 2)     Status: None (Preliminary result)   Collection Time: 11/19/19  5:15 PM   Specimen: BLOOD RIGHT ARM  Result Value Ref Range Status   Specimen Description BLOOD RIGHT ARM  Final   Special Requests   Final    BOTTLES DRAWN AEROBIC AND ANAEROBIC Blood Culture results may not be optimal due to an inadequate volume of blood received in culture bottles   Culture   Final    NO GROWTH < 24 HOURS Performed at [facility], [address] [place], [place] [address]    Report Status PENDING  Incomplete  Culture, blood (routine x 2)     Status: None (Preliminary result)   Collection Time: 11/19/19  5:20 PM   Specimen: BLOOD RIGHT HAND  Result Value Ref Range Status   Specimen Description BLOOD RIGHT HAND  Final   Special Requests   Final    AEROBIC BOTTLE ONLY Blood Culture results may not be optimal due to an inadequate volume of blood received in culture bottles   Culture   Final    NO GROWTH < 24 HOURS Performed at [facility], [place] [address]  [place], [place] [address]    Report Status PENDING  Incomplete    Impression/Plan:  1. Probable aseptic meningitis - no culture growth from [facility] though can continue with planned two weeks of antibiotics.  I discussed with the patient that it will likely be another 1-2 weeks of continued symptoms including headache, photophobia, malaise, fever.  Hopefully will diminish sooner though.  Main goal will be supportive care, NSAIDS, headache cocktails.    2.  Lethargy, confusion - this has resolved.  She received 8 mg of morphine yesterday likely exacerbating this.  Improved this am.   3.  Rheumatoid arthritis - Dr. Estanislado Pandy is holding on her medication.  From an ID perspective, after she has complete recovery of her symptoms including headache, photophobia, fever etc.. without NSAIDs, ok from ID standpoint to restart O[place]cia per discretion of Dr. Estanislado Pandy.    I will sign off, call with any questions We are available for follow up if needed though she likely has follow up with ID at [facility] who is managing her IV antibiotics.

## 2019-11-20 NOTE — Plan of Care (Signed)
  Problem: Education: Goal: Knowledge of General Education information will improve Description: Including pain rating scale, medication(s)/side effects and non-pharmacologic comfort measures Outcome: Progressing   Problem: Elimination: Goal: Will not experience complications related to bowel motility Outcome: Progressing   Problem: Pain Managment: Goal: General experience of comfort will improve Outcome: Progressing   

## 2019-11-20 NOTE — Telephone Encounter (Signed)
Patient's husband Dawn [name] called stating Dawn [name] still hasn't been given Prednisone that Dr. Corliss Skains recommended.  Dawn [name] is very upset and requesting a return call ASAP.

## 2019-11-20 NOTE — Evaluation (Signed)
Physical Therapy Evaluation Patient Details Name: Dawn [name] MRN: [id number] DOB: 1981/06/06 Today's Date: 11/20/2019   History of Present Illness  Patient is a 39 year old female admitted with syncopal episode at home, nausea, vomiting. Diagnosed with meningitis. PMH includes RA.  Clinical Impression  Patient received in bed, alert, agrees to PT assessment. Patient is independent with bed mobility, transfers and ambulation in the room. Reports she has been getting up to bathroom. She reports increased nausea with increased mobility and headache. She is encouraged to ambulate in halls when she feels up to it. Patient does not require PT follow up at this time. Will sign off.     Follow Up Recommendations No PT follow up    Equipment Recommendations  None recommended by PT    Recommendations for Other Services       Precautions / Restrictions Precautions Precautions: Fall Precaution Comments: mod fall Restrictions Weight Bearing Restrictions: No      Mobility  Bed Mobility Overal bed mobility: Independent                Transfers Overall transfer level: Independent                  Ambulation/Gait Ambulation/Gait assistance: Independent Gait Distance (Feet): 25 Feet Assistive device: None Gait Pattern/deviations: WFL(Within Functional Limits) Gait velocity: WNL   General Gait Details: reports increased nausea with increased mobility  Stairs            Wheelchair Mobility    Modified Rankin (Stroke Patients Only)       Balance Overall balance assessment: Independent                                           Pertinent Vitals/Pain Pain Assessment: Faces Faces Pain Scale: Hurts little more Pain Location: headache Pain Descriptors / Indicators: Headache Pain Intervention(s): Monitored during session;Patient requesting pain meds-RN notified    Home Living Family/patient expects to be discharged to:: Private  residence Living Arrangements: Other relatives Available Help at Discharge: Family           Home Equipment: None      Prior Function Level of Independence: Independent               Hand Dominance        Extremity/Trunk Assessment   Upper Extremity Assessment Upper Extremity Assessment: Overall WFL for tasks assessed    Lower Extremity Assessment Lower Extremity Assessment: Overall WFL for tasks assessed    Cervical / Trunk Assessment Cervical / Trunk Assessment: Normal  Communication   Communication: No difficulties  Cognition Arousal/Alertness: Awake/alert Behavior During Therapy: WFL for tasks assessed/performed Overall Cognitive Status: Within Functional Limits for tasks assessed                                        General Comments      Exercises     Assessment/Plan    PT Assessment Patent does not need any further PT services  PT Problem List Pain;Decreased activity tolerance       PT Treatment Interventions      PT Goals (Current goals can be found in the Care Plan section)  Acute Rehab PT Goals Patient Stated Goal: to feel better, go home PT Goal Formulation: With patient Time  For Goal Achievement: 11/23/19 Potential to Achieve Goals: Good    Frequency     Barriers to discharge        Co-evaluation               AM-PAC PT "6 Clicks" Mobility  Outcome Measure Help needed turning from your back to your side while in a flat bed without using bedrails?: None Help needed moving from lying on your back to sitting on the side of a flat bed without using bedrails?: None Help needed moving to and from a bed to a chair (including a wheelchair)?: None Help needed standing up from a chair using your arms (e.g., wheelchair or bedside chair)?: None Help needed to walk in hospital room?: None Help needed climbing 3-5 steps with a railing? : None 6 Click Score: 24    End of Session   Activity Tolerance: Patient  tolerated treatment well Patient left: in bed;with call bell/phone within reach;with nursing/sitter in room Nurse Communication: Mobility status;Patient requests pain meds PT Visit Diagnosis: Other abnormalities of gait and mobility (R26.89)    Time: 1115-1130 PT Time Calculation (min) (ACUTE ONLY): 15 min   Charges:   PT Evaluation $PT Eval Low Complexity: 1 Low PT Treatments $Gait Training: 8-22 mins        Yaniv Lage, PT, GCS 11/20/19,12:01 PM

## 2019-11-20 NOTE — Progress Notes (Signed)
Patient ID: Dawn [name], female   DOB: [birth year], 39 y.o.   MRN: [id number] BP 114/65 (BP Location: Left Arm)   Pulse 60   Temp 99.2 F (37.3 C)   Resp 16   Ht 5\' 6"  (1.676 m)   Wt 63.9 kg   SpO2 97%   BMI 22.74 kg/m  Alert and oriented x 4, speech is clear and fluent Perrl, full eom Tongue and uvula midline Symmetric facial movements Moving all extremities well Have read Dr. partial note. Abx to be completed for a two course. Most likely aseptic meningitis Dawn [name] is much improved since yesterday. Still complains of headaches and nausea. NSAIDS recommended, and toradol is being given.  Will follow.

## 2019-11-20 NOTE — Progress Notes (Addendum)
PROGRESS NOTE    Dawn [name]  VIF:[id number] DOB: [birth year] DOA: 11/18/2019 PCP: Don Broach, [facility]   Brief Narrative:39 y.o. female with medical history significant for rheumatoid arthritis on Orencia, recent admission with suspected meningitis, now presenting to the emergency department for evaluation of headache, nausea, loose stools, and syncope.  Patient reports that she developed headache and nausea on 11/08/2019, went on to have fevers and neck pain as well, was admitted to [facility] in [place], had LP that was felt to be concerning for possible bacterial meningitis, she was treated with Decadron, ampicillin, Rocephin, and vancomycin, and admitted to the hospital there.  It does not appear that any organisms were seen in the CSF, no positive cultures, and viral panels were all negative.  She had PICC placed and was discharged yesterday with plan to continue Rocephin and linezolid.  Since returning home, she has continued headaches and nausea, was seated on the toilet having loose stools, and then suffered a transient loss of consciousness without any chest pain.  Patient denies any focal numbness or weakness and there has not been any seizure-like activity.  She reports having a fever again since returning home from the hospital yesterday.  Denies respiratory symptoms.  She reports multiple negative COVID-19 tests since onset of her symptoms.  ED Course: Upon arrival to the ED, patient is found to be afebrile, saturating well on room air, bradycardic in the 50s, and with blood pressure 104/66.  Orthostatic vital signs are negative.  EKG with sinus bradycardia, rate 51.  Chest x-ray is negative for acute cardiopulmonary disease.  Chemistry panel is unremarkable.  CBC with leukocytosis to 11,200, mild normocytic anemia, and mild thrombocytosis.  Initial lactic acid was 2.0.  COVID-19 screening test not yet resulted.  Patient was treated with 3 L of IV fluids, Rocephin,  linezolid, Toradol, Zofran, and Compazine in the ED.   Assessment & Plan:   Principal Problem:   Meningitis Active Problems:   Rheumatoid arthritis with rheumatoid factor of multiple sites without organ or systems involvement (HCC)   Syncope   #1 Meningitis -diagnosed at [facility] discharged home on IV antibiotics Rocephin and Zyvox admitted here with worsening headache. She was started on Toradol Reglan and sumatriptan She continues to be nauseated has not had anything to eat or drink today or yesterday, remains on IV fluids. Continue Rocephin and Vanco during the hospital stay. Encouraged her and her husband to try and eat and drink so she can be discharged home tomorrow. Appreciate ID input. Discussed with Dr. Corliss Skains and Dr. Earle Gell not planning to start her on any steroids at this time due to active infection.  #2 history of rheumatoid arthritis followed by Dr. Emmie Niemann on hold.  #3 syncope likely secondary to hypotension and severe headache.  She was in sinus bradycardia on admission which has been resolved.  Normal TSH normal echo.      Nutrition Problem: Inadequate oral intake Etiology: nausea     Signs/Symptoms: per patient/family report    Interventions: Boost Breeze, MVI  Estimated body mass index is 22.74 kg/m as calculated from the following:   Height as of this encounter: 5\' 6"  (1.676 m).   Weight as of this encounter: 63.9 kg.  DVT prophylaxis: SCD Code Status: Full code Family Communication: Discussed with patient and husband  Disposition Plan patient came from home plan is to discharge her home seen by PT.  Barriers to discharge patient not eating or drinking for safe discharge.  Encourage  her to eat and drink she has been on IV fluids and is still on IV hydration. Consultants: Infectious disease   Procedures: None Antimicrobials: Rocephin and Vanco  Subjective: She is awake appears better than yesterday feels better than yesterday  but still nauseous with no p.o. intake  Objective: Vitals:   11/19/19 2300 11/20/19 0540 11/20/19 0648 11/20/19 1300  BP:  114/65  122/71  Pulse:  60  79  Resp:  16  16  Temp:  (!) 100.4 F (38 C) 99.2 F (37.3 C) 100.1 F (37.8 C)  TempSrc:    Oral  SpO2:  97%  98%  Weight: 63.9 kg     Height:        Intake/Output Summary (Last 24 hours) at 11/20/2019 1453 Last data filed at 11/20/2019 0400 Gross per 24 hour  Intake 3213.27 ml  Output 700 ml  Net 2513.27 ml   Filed Weights   11/18/19 2204 11/19/19 2300  Weight: 59.9 kg 63.9 kg    Examination:  General exam: Appears calm and comfortable  Respiratory system: Clear to auscultation. Respiratory effort normal. Cardiovascular system: S1 & S2 heard, RRR. No JVD, murmurs, rubs, gallops or clicks. No pedal edema. Gastrointestinal system: Abdomen is nondistended, soft and nontender. No organomegaly or masses felt. Normal bowel sounds heard. Central nervous system: Alert and oriented. No focal neurological deficits. Extremities: Symmetric 5 x 5 power. Skin: No rashes, lesions or ulcers Psychiatry: Judgement and insight appear normal. Mood & affect appropriate.     Data Reviewed: I have personally reviewed following labs and imaging studies  CBC: Recent Labs  Lab 11/18/19 2214 11/20/19 0439  WBC 11.2* 10.7*  NEUTROABS 8.4*  --   HGB 11.6* 10.1*  HCT 35.1* 30.3*  MCV 82.4 82.8  PLT 403* 315   Basic Metabolic Panel: Recent Labs  Lab 11/18/19 2214 11/20/19 0439  NA 140 139  K 3.5 3.1*  CL 107 107  CO2 22 23  GLUCOSE 119* 111*  BUN 12 <5*  CREATININE 0.52 0.41*  CALCIUM 8.4* 7.8*  MG  --  1.8   GFR: Estimated Creatinine Clearance: 87.5 mL/min (A) (by C-G formula based on SCr of 0.41 mg/dL (L)). Liver Function Tests: Recent Labs  Lab 11/18/19 2214  AST 14*  ALT 11  ALKPHOS 69  BILITOT 0.6  PROT 6.2*  ALBUMIN 3.2*   No results for input(s): LIPASE, AMYLASE in the last 168 hours. No results for  input(s): AMMONIA in the last 168 hours. Coagulation Profile: No results for input(s): INR, PROTIME in the last 168 hours. Cardiac Enzymes: No results for input(s): CKTOTAL, CKMB, CKMBINDEX, TROPONINI in the last 168 hours. BNP (last 3 results) No results for input(s): PROBNP in the last 8760 hours. HbA1C: No results for input(s): HGBA1C in the last 72 hours. CBG: No results for input(s): GLUCAP in the last 168 hours. Lipid Profile: No results for input(s): CHOL, HDL, LDLCALC, TRIG, CHOLHDL, LDLDIRECT in the last 72 hours. Thyroid Function Tests: Recent Labs    11/19/19 1414  TSH 1.395   Anemia Panel: No results for input(s): VITAMINB12, FOLATE, FERRITIN, TIBC, IRON, RETICCTPCT in the last 72 hours. Sepsis Labs: Recent Labs  Lab 11/18/19 2214 11/19/19 0241  LATICACIDVEN 2.0* 1.5    Recent Results (from the past 240 hour(s))  SARS CORONAVIRUS 2 (TAT 6-24 HRS) Nasopharyngeal Nasopharyngeal Swab     Status: None   Collection Time: 11/19/19  6:01 AM   Specimen: Nasopharyngeal Swab  Result Value Ref Range Status  SARS Coronavirus 2 NEGATIVE NEGATIVE Final    Comment: (NOTE) SARS-CoV-2 target nucleic acids are NOT DETECTED. The SARS-CoV-2 RNA is generally detectable in upper and lower respiratory specimens during the acute phase of infection. Negative results do not preclude SARS-CoV-2 infection, do not rule out co-infections with other pathogens, and should not be used as the sole basis for treatment or other patient management decisions. Negative results must be combined with clinical observations, patient history, and epidemiological information. The expected result is Negative. Fact Sheet for Patients: HairSlick.no Fact Sheet for Healthcare Providers: quierodirigir.com This test is not yet approved or cleared by the [place] FDA and  has been authorized for detection and/or diagnosis of SARS-CoV-2 by FDA under  an Emergency Use Authorization (EUA). This EUA will remain  in effect (meaning this test can be used) for the duration of the COVID-19 declaration under Section 56 4(b)(1) of the Act, 21 U.S.C. section 360bbb-3(b)(1), unless the authorization is terminated or revoked sooner. Performed at [facility], [address] [place], [place] [address]   Culture, blood (routine x 2)     Status: None (Preliminary result)   Collection Time: 11/19/19  5:15 PM   Specimen: BLOOD RIGHT ARM  Result Value Ref Range Status   Specimen Description BLOOD RIGHT ARM  Final   Special Requests   Final    BOTTLES DRAWN AEROBIC AND ANAEROBIC Blood Culture results may not be optimal due to an inadequate volume of blood received in culture bottles   Culture   Final    NO GROWTH < 24 HOURS Performed at [facility], [address] [place], [place] [address]    Report Status PENDING  Incomplete  Culture, blood (routine x 2)     Status: None (Preliminary result)   Collection Time: 11/19/19  5:20 PM   Specimen: BLOOD RIGHT HAND  Result Value Ref Range Status   Specimen Description BLOOD RIGHT HAND  Final   Special Requests   Final    AEROBIC BOTTLE ONLY Blood Culture results may not be optimal due to an inadequate volume of blood received in culture bottles   Culture   Final    NO GROWTH < 24 HOURS Performed at [facility], [address] [place], [place] [address]    Report Status PENDING  Incomplete         Radiology Studies: CT HEAD WO CONTRAST  Result Date: 11/19/2019 CLINICAL DATA:  Headache, intracranial hemorrhage suspected. EXAM: CT HEAD WITHOUT CONTRAST TECHNIQUE: Contiguous axial images were obtained from the base of the skull through the vertex without intravenous contrast. COMPARISON:  No pertinent prior studies available for comparison. FINDINGS: Brain: No evidence of acute intracranial hemorrhage. No demarcated cortical infarction. No evidence of intracranial mass. No  midline shift, extra-axial fluid collection or hydrocephalus. Cerebral volume is normal for age. Vascular: No hyperdense vessel. Skull: Normal. Negative for fracture or focal lesion. Sinuses/Orbits: Visualized orbits demonstrate no acute abnormality. Trace right maxillary sinus mucosal thickening. No significant mastoid effusion. IMPRESSION: Normal noncontrast CT appearance of the brain. No evidence of acute intracranial abnormality. Electronically Signed   By: Jackey Loge DO   On: 11/19/2019 17:05   DG Chest Portable 1 View  Result Date: 11/18/2019 CLINICAL DATA:  Infection, recent discharge for bacterial meningitis EXAM: PORTABLE CHEST 1 VIEW COMPARISON:  Radiograph 09/23/2015 FINDINGS: Left upper extremity PICC terminates at the mid SVC. No consolidation, features of edema, pneumothorax, or effusion. Pulmonary vascularity is normally distributed.  The cardiomediastinal contours are unremarkable. No acute osseous or soft tissue abnormality. IMPRESSION: No acute cardiopulmonary abnormality. Electronically Signed   By: Kreg Shropshire M.D.   On: 11/18/2019 22:46   ECHOCARDIOGRAM COMPLETE  Result Date: 11/19/2019   ECHOCARDIOGRAM REPORT   Patient Name:   [facility] Date of Exam: 11/19/2019 Medical Rec #:  [id number]       Height:       66.0 in Accession #:    [id number]      Weight:       132.0 lb Date of Birth:  1981-02-04       BSA:          1.68 m Patient Age:    38 years        BP:           101/58 mmHg Patient Gender: F               HR:           48 bpm. Exam Location:  Inpatient Procedure: 2D Echo Indications:    Syncope 780.2 / R55  History:        Patient has no prior history of Echocardiogram examinations.                 Signs/Symptoms:Alzheimer's. Meningitis, headache, nausea.  Sonographer:    Leta Jungling RDCS Referring Phys: [id number] TIMOTHY S OPYD IMPRESSIONS  1. Left ventricular ejection fraction, by visual estimation, is 60 to 65%. The left ventricle has normal function. There is no left  ventricular hypertrophy.  2. The left ventricle has no regional wall motion abnormalities.  3. Global right ventricle has normal systolic function.The right ventricular size is normal. No increase in right ventricular wall thickness.  4. Left atrial size was moderately dilated.  5. Right atrial size was normal.  6. Small pericardial effusion.  7. The mitral valve is normal in structure. Trivial mitral valve regurgitation.  8. The tricuspid valve is normal in structure.  9. The tricuspid valve is normal in structure. Tricuspid valve regurgitation is trivial. 10. The aortic valve has an indeterminant number of cusps. Aortic valve regurgitation is not visualized. No evidence of aortic valve sclerosis or stenosis. 11. The pulmonic valve was grossly normal. Pulmonic valve regurgitation is not visualized. 12. Normal pulmonary artery systolic pressure. FINDINGS  Left Ventricle: Left ventricular ejection fraction, by visual estimation, is 60 to 65%. The left ventricle has normal function. The left ventricle has no regional wall motion abnormalities. There is no left ventricular hypertrophy. Left ventricular diastolic parameters were normal. Right Ventricle: The right ventricular size is normal. No increase in right ventricular wall thickness. Global RV systolic function is has normal systolic function. The tricuspid regurgitant velocity is 1.75 m/s, and with an assumed right atrial pressure  of 3 mmHg, the estimated right ventricular systolic pressure is normal at 15.2 mmHg. Left Atrium: Left atrial size was moderately dilated. Right Atrium: Right atrial size was normal in size Pericardium: A small pericardial effusion is present. Mitral Valve: The mitral valve is normal in structure. Trivial mitral valve regurgitation. Tricuspid Valve: The tricuspid valve is normal in structure. Tricuspid valve regurgitation is trivial. Aortic Valve: The aortic valve has an indeterminant number of cusps. Aortic valve regurgitation is not  visualized. The aortic valve is structurally normal, with no evidence of sclerosis or stenosis. Pulmonic Valve: The pulmonic valve was grossly normal. Pulmonic valve regurgitation is not visualized. Pulmonic regurgitation is not visualized. Aorta: The aortic root, ascending  aorta, aortic arch and descending aorta are all structurally normal, with no evidence of dilitation or obstruction. Pulmonary Artery: The pulmonary artery is not well seen. IAS/Shunts: No atrial level shunt detected by color flow Doppler.  LEFT VENTRICLE PLAX 2D LVIDd:         5.00 cm  Diastology LVIDs:         3.30 cm  LV e' lateral:   12.90 cm/s LV PW:         0.60 cm  LV E/e' lateral: 8.7 LV IVS:        0.80 cm  LV e' medial:    13.50 cm/s LVOT diam:     1.80 cm  LV E/e' medial:  8.3 LV SV:         74 ml LV SV Index:   44.40 LVOT Area:     2.54 cm  RIGHT VENTRICLE RV S prime:     21.20 cm/s TAPSE (M-mode): 3.2 cm LEFT ATRIUM           Index       RIGHT ATRIUM           Index LA diam:      3.40 cm 2.03 cm/m  RA Area:     11.50 cm LA Vol (A2C): 30.1 ml 17.96 ml/m RA Volume:   22.60 ml  13.48 ml/m LA Vol (A4C): 68.3 ml 40.75 ml/m  AORTIC VALVE LVOT Vmax:   124.00 cm/s LVOT Vmean:  84.000 cm/s LVOT VTI:    0.260 m  AORTA Ao Root diam: 2.80 cm Ao Asc diam:  2.80 cm MITRAL VALVE                         TRICUSPID VALVE MV Area (PHT): 3.91 cm              TR Peak grad:   12.2 mmHg MV PHT:        56.26 msec            TR Vmax:        175.00 cm/s MV Decel Time: 194 msec MV E velocity: 112.00 cm/s 103 cm/s  SHUNTS MV A velocity: 74.70 cm/s  70.3 cm/s Systemic VTI:  0.26 m MV E/A ratio:  1.50        1.5       Systemic Diam: 1.80 cm  Jodelle Red MD Electronically signed by Jodelle Red MD Signature Date/Time: 11/19/2019/1:25:39 PM    Final         Scheduled Meds: . Chlorhexidine Gluconate Cloth  6 each Topical Daily  . feeding supplement  1 Container Oral TID BM  . ketorolac  7.5 mg Intravenous Q8H  . metoCLOPramide  (REGLAN) injection  5 mg Intravenous Q8H  . multivitamin with minerals  1 tablet Oral Daily  . sodium chloride flush  3 mL Intravenous Once  . sodium chloride flush  3 mL Intravenous Q12H   Continuous Infusions: . sodium chloride 250 mL/hr at 11/20/19 1151  . cefTRIAXone (ROCEPHIN)  IV Stopped (11/20/19 0700)  . linezolid (ZYVOX) IV 600 mg (11/20/19 1216)     LOS: 1 day     Alwyn Ren, MD [facility]  If 7PM-7AM, please contact night-coverage [email or web address] Password [facility] 11/20/2019, 2:53 PM

## 2019-11-20 NOTE — Telephone Encounter (Signed)
I tried to reach [place] on his cell phone number and also tried Chihiro's cell phone number but had no response.  Left message for [name] to return the call.  I earlier received a message from Dr. Ashley Royalty and spoke with her as well.  She stated that she discussed with Dr. Luciana Axe and does not see the need for starting her on prednisone.  She was never started on steroids.

## 2019-11-20 NOTE — Telephone Encounter (Signed)
[name] states he talked to Dr. Corliss Skains and Dr. Luciana Axe (ID). [name] states that Dr. Luciana Axe wanted Berenice Primas to be started on Prednisone. [name] states that Dr. Franky Macho also agreed that patient should be started on Prednisone.[name] states the order was written and then the nurse advised Brad that Dr. Ashley Royalty cancelled the order. [name] states Berenice Primas has still not received Prednisone. He states that they have not been very attentive. I advised Brad to speak with the nurse in the hospital to have questions answered and to have them contact the doctor regarding the prednisone.

## 2019-11-21 ENCOUNTER — Telehealth: Payer: Self-pay | Admitting: Rheumatology

## 2019-11-21 LAB — CBC
HCT: 29.5 % — ABNORMAL LOW (ref 36.0–46.0)
Hemoglobin: 9.8 g/dL — ABNORMAL LOW (ref 12.0–15.0)
MCH: 27.2 pg (ref 26.0–34.0)
MCHC: 33.2 g/dL (ref 30.0–36.0)
MCV: 81.9 fL (ref 80.0–100.0)
Platelets: 268 10*3/uL (ref 150–400)
RBC: 3.6 MIL/uL — ABNORMAL LOW (ref 3.87–5.11)
RDW: 13.2 % (ref 11.5–15.5)
WBC: 9.3 10*3/uL (ref 4.0–10.5)
nRBC: 0 % (ref 0.0–0.2)

## 2019-11-21 LAB — BASIC METABOLIC PANEL
Anion gap: 8 (ref 5–15)
BUN: <5 mg/dL — ABNORMAL LOW (ref 6–20)
CO2: 23 mmol/L (ref 22–32)
Calcium: 8 mg/dL — ABNORMAL LOW (ref 8.9–10.3)
Chloride: 107 mmol/L (ref 98–111)
Creatinine, Ser: 0.45 mg/dL (ref 0.44–1.00)
GFR calc Af Amer: >60 mL/min (ref >60)
GFR calc non Af Amer: >60 mL/min (ref >60)
Glucose, Bld: 111 mg/dL — ABNORMAL HIGH (ref 70–99)
Potassium: 3.2 mmol/L — ABNORMAL LOW (ref 3.5–5.1)
Sodium: 138 mmol/L (ref 135–145)

## 2019-11-21 MED ORDER — PROMETHAZINE HCL 12.5 MG PO TABS: 12.5000 mg | ORAL_TABLET | Freq: Three times a day (TID) | ORAL | 0 refills | Status: AC | PRN

## 2019-11-21 MED ORDER — SIMETHICONE 80 MG PO CHEW: 80.0000 mg | CHEWABLE_TABLET | Freq: Four times a day (QID) | ORAL | 0 refills | Status: AC | PRN

## 2019-11-21 MED ORDER — ACETAMINOPHEN 325 MG PO TABS: 1000.0000 mg | ORAL_TABLET | Freq: Three times a day (TID) | ORAL | 0 refills | Status: AC | PRN

## 2019-11-21 MED ORDER — PANTOPRAZOLE SODIUM 40 MG PO TBEC
40.0000 mg | DELAYED_RELEASE_TABLET | Freq: Every day | ORAL | Status: DC
  Administered 2019-11-21 – 2019-11-24 (×4): 40 mg via ORAL
  Filled 2019-11-21 (×4): qty 1

## 2019-11-21 MED ORDER — ONDANSETRON HCL 4 MG PO TABS: 4.0000 mg | ORAL_TABLET | Freq: Three times a day (TID) | ORAL | 0 refills | Status: AC | PRN

## 2019-11-21 MED ORDER — GENERIC EXTERNAL MEDICATION
Status: DC
Start: ? — End: 2019-11-21

## 2019-11-21 MED ORDER — SODIUM CHLORIDE 0.9% FLUSH
10.0000 mL | INTRAVENOUS | Status: DC | PRN
  Administered 2019-11-23: 10 mL

## 2019-11-21 MED ORDER — HEPARIN SOD (PORK) LOCK FLUSH 100 UNIT/ML IV SOLN
250.0000 [IU] | INTRAVENOUS | Status: DC | PRN
  Filled 2019-11-21: qty 2.5

## 2019-11-21 MED ORDER — CEFTRIAXONE IV (FOR PTA / DISCHARGE USE ONLY): 2.0000 g | Freq: Two times a day (BID) | INTRAVENOUS | 0 refills | Status: DC

## 2019-11-21 MED ORDER — POTASSIUM CITRATE ER 10 MEQ (1080 MG) PO TBCR
40.0000 meq | EXTENDED_RELEASE_TABLET | Freq: Once | ORAL | Status: DC
  Filled 2019-11-21: qty 4

## 2019-11-21 MED ORDER — SUMATRIPTAN SUCCINATE 50 MG PO TABS: 50.0000 mg | ORAL_TABLET | ORAL | 0 refills | Status: AC | PRN

## 2019-11-21 MED ORDER — PANTOPRAZOLE SODIUM 40 MG PO TBEC: 40.0000 mg | DELAYED_RELEASE_TABLET | Freq: Every day | ORAL | 0 refills | Status: AC

## 2019-11-21 MED ORDER — POTASSIUM CHLORIDE CRYS ER 10 MEQ PO TBCR
40.0000 meq | EXTENDED_RELEASE_TABLET | Freq: Once | ORAL | Status: AC
  Administered 2019-11-21: 40 meq via ORAL
  Filled 2019-11-21: qty 4

## 2019-11-21 MED ORDER — IBUPROFEN 600 MG PO TABS: 600.0000 mg | ORAL_TABLET | Freq: Three times a day (TID) | ORAL | 0 refills | Status: DC | PRN

## 2019-11-21 MED ORDER — SIMETHICONE 80 MG PO CHEW: 80.0000 mg | CHEWABLE_TABLET | Freq: Four times a day (QID) | ORAL | Status: DC | PRN

## 2019-11-21 NOTE — Progress Notes (Signed)
PHARMACY CONSULT NOTE FOR:  OUTPATIENT  PARENTERAL ANTIBIOTIC THERAPY (OPAT)  Indication: Meningitis Regimen: Ceftriaxone 2 gm IV  Q 12 hours + Linezolid 600 mg PO BID  End date: 12/01/19  IV antibiotic discharge orders are pended. To discharging provider:  please sign these orders via discharge navigator,  Select New Orders & click on the button choice - Manage This Unsigned Work.     Thank you for allowing pharmacy to be a part of this patient's care.  Sharin Mons, PharmD, BCPS, BCIDP Infectious Diseases Clinical Pharmacist Phone: [phone] 11/21/2019, 4:16 PM

## 2019-11-21 NOTE — Discharge Summary (Signed)
Physician Discharge Summary  Dawn [name] VUY:[id number] DOB: [birth year] DOA: 11/18/2019  PCP: [facility], [place] date: 11/18/2019 Discharge date: 11/21/2019  Admitted From: Home Disposition:  Home  Recommendations for Outpatient Follow-up:  1. Follow up with Infectious disease in 1-2 weeks 2. Please obtain BMP/CBC in one week   Home Health: Yes, home infusion for antibiotics Equipment/Devices: PICC line (placed during hospitalization at [facility])  Discharge Condition:Stable CODE STATUS: Full Diet recommendation: Regular diet  Brief/Interim Summary: 39 y.o.femalewith medical history significant forrheumatoid arthritis on Oren[place], recent admission with suspected meningitis, now presenting to the emergency department for evaluation of headache, nausea, loose stools, and syncope. Patient reports that she developed headache and nausea on 11/08/2019, went on to have fevers and neck pain as well, was admitted to [facility] in [place], had LP that was felt to be concerning for possible bacterial meningitis, she was treated with Decadron, ampicillin, Rocephin, and vancomycin, and admitted to the hospital there. It does not appear that any organisms were seen in the CSF, no positive cultures, and viral panels were all negative. She had PICC placed and was discharged yesterday with plan to continue Rocephin and linezolid. Since returning home, she has continued headaches and nausea, was seated on the toilet having loose stools, and then suffered a transient loss of consciousness without any chest pain. Patient denies any focal numbness or weakness and there has not been any seizure-like activity. She reports having a fever again since returning home from the hospital yesterday. Denies respiratory symptoms. She reports multiple negative COVID-19 tests since onset of her symptoms.  ED Course:Upon arrival to the ED, patient is found to be afebrile,  saturating well on room air, bradycardic in the 50s, and with blood pressure 104/66. Orthostatic vital signs are negative. EKG with sinus bradycardia, rate 51. Chest x-ray is negative for acute cardiopulmonary disease. Chemistry panel is unremarkable. CBC with leukocytosis to 11,200, mild normocytic anemia, and mild thrombocytosis. Initial lactic acid was 2.0. COVID-19 screening test not yet resulted. Patient was treated with 3 L of IV fluids, Rocephin, linezolid, Toradol, Zofran, and Compazine in the ED.   Discharge Diagnoses:  Principal Problem:   Meningitis Active Problems:   Rheumatoid arthritis with rheumatoid factor of multiple sites without organ or systems involvement (Dawn [name])   Syncope #1 Meningitis -diagnosed at [facility] discharged home on IV antibiotics Rocephin and Zyvox admitted here with worsening headache. She was started on Toradol Reglan and sumatriptan. Her headache improved some but was still present. She will be discharged with home regimen alternating Tylenol and Ibuprofen every 4 hours and Imitrex as needed for persistent headache.  Continue Rocephin and Zyvox after discharge.  She was seen by Infectious disease, Dr. Linus Salmons, who recommended that she continues the current antibiotic treatment.  She will follow up with Dr. Layne Benton at [facility] Infectious disease. She low grade fever while here with Tmax of 100.75F on the day of her discharge.  She tolerated a smoothie, soft foods, Jell O and fluids prior to her discharge with no vomiting.  She will continue home regimen of alternating Zofran and phenergan for the nausea after her discharge.  Of note, the patient was not treated with steroids during this hospitalization due to concern of possible complications with her meningitis. She was also seen by Neurosurgery, Dr. Christella Noa, with recommendation to continue NSAID treatment.  She complained of increase in floaters on the day of her discharge and was seen by Dr. Manuella Ghazi in  Ophthalmology who suspected  possible posterior vitreous detachment and recommended outpatient follow up in 2-3 weeks.  The plan of care was discussed with detail with the patient and with her husband, Dawn [name], at the bedside.   #2 history of rheumatoid arthritis - followed by Dr. Arrie Aran on hold. I tried to reach out to Dr. Estanislado Pandy to clarify her recommendations on prednisone which she had apparently discussed with the patient and the patient's huband but, unfortunately, the clinic was closed and there was no on-call service available. I discussed with the patient and her husband that prednisone was currently not recommended by Infectious Disease and carried potential risks for complications of her meningitis. The patient and her husband agreed to not start treatment with prednisone at this time.   #3 syncope - likely secondary to hypotension and severe headache.  She was in sinus bradycardia on admission which has been resolved.  Normal TSH and normal 2D echo. Her BP remained stable. She was rehydrated with IV fluids while inpatient. She was tolerated increase in oral intake and able to maintain hydration prior to her discharge.      Discharge Instructions  Discharge Instructions    Continue PICC at discharge   Complete by: As directed    Flush PICC line per home health policy: Yes   Diet general   Complete by: As directed    Home infusion instructions   Complete by: As directed    Instructions: Flushing of vascular access device: 0.9% NaCl pre/post medication administration and prn patency; Heparin 100 u/ml, 35m for implanted ports and Heparin 10u/ml, 512mfor all other central venous catheters.   Increase activity slowly   Complete by: As directed      Allergies as of 11/21/2019   No Known Allergies     Medication List    STOP taking these medications   ORENCIA IV     TAKE these medications   acetaminophen 325 MG tablet Commonly known as: TYLENOL Take 3 tablets  (975 mg total) by mouth every 8 (eight) hours as needed for mild pain, fever or headache. Take no more than 400036mf Tylenol per day   cefTRIAXone 2 g injection Commonly known as: ROCEPHIN Inject 2 g into the muscle every 12 (twelve) hours. What changed: Another medication with the same name was added. Make sure you understand how and when to take each.   cefTRIAXone  IVPB Commonly known as: ROCEPHIN Inject 2 g into the vein every 12 (twelve) hours for 10 days. Indication:  Meningitis Last Day of Therapy:  12/01/19 Labs - Once weekly:  CBC/D and BMP, Labs - Every other week:  ESR and CRP What changed: You were already taking a medication with the same name, and this prescription was added. Make sure you understand how and when to take each.   ibuprofen 600 MG tablet Commonly known as: ADVIL Take 1 tablet (600 mg total) by mouth every 8 (eight) hours as needed for up to 10 days for fever, headache or moderate pain. Take it with food.Alternate with Tylenol if needed.   linezolid 600 MG tablet Commonly known as: ZYVOX Take 600 mg by mouth 2 (two) times daily.   ondansetron 4 MG tablet Commonly known as: Zofran Take 1 tablet (4 mg total) by mouth every 8 (eight) hours as needed for nausea or vomiting. Alternate with phenergan if needed   pantoprazole 40 MG tablet Commonly known as: PROTONIX Take 1 tablet (40 mg total) by mouth daily. Start taking on: November 22, 2019  promethazine 12.5 MG tablet Commonly known as: PHENERGAN Take 1 tablet (12.5 mg total) by mouth every 8 (eight) hours as needed for nausea or vomiting. Alternate with Zofran if needed What changed:   when to take this  additional instructions   simethicone 80 MG chewable tablet Commonly known as: MYLICON Chew 1 tablet (80 mg total) by mouth 4 (four) times daily as needed (stomach gas/cramp).            Home Infusion Instuctions  (From admission, onward)         Start     Ordered   11/21/19 0000  Home  infusion instructions    Question:  Instructions  Answer:  Flushing of vascular access device: 0.9% NaCl pre/post medication administration and prn patency; Heparin 100 u/ml, 31m for implanted ports and Heparin 10u/ml, 589mfor all other central venous catheters.   11/21/19 1839         Follow-up Information    ArMinerva FesterMD. Go to.   Specialty: Infectious Diseases Why: As previously scheduled. Call them if questions with the antibiotics.  Contact information: [id number] [phone]      ShDanice GoltzMD. Schedule an appointment as soon as possible for a visit in 2 week(s).   Specialty: Ophthalmology Why: Call and make an appointment with Opthalmology for 2 weeks from now.  Contact information: [phone]        No Known Allergies  Consultations:  Neurosurgery  Infectious Disease  Ophthalmology    Procedures/Studies: CT HEAD WO CONTRAST  Result Date: 11/19/2019 CLINICAL DATA:  Headache, intracranial hemorrhage suspected. EXAM: CT HEAD WITHOUT CONTRAST TECHNIQUE: Contiguous axial images were obtained from the base of the skull through the vertex without intravenous contrast. COMPARISON:  No pertinent prior studies available for comparison. FINDINGS: Brain: No evidence of acute intracranial hemorrhage. No demarcated cortical infarction. No evidence of intracranial mass. No midline shift, extra-axial fluid collection or hydrocephalus. Cerebral volume is normal for age. Vascular: No hyperdense vessel. Skull: Normal. Negative for fracture or focal lesion. Sinuses/Orbits: Visualized orbits demonstrate no acute abnormality. Trace right maxillary sinus mucosal thickening. No significant mastoid effusion. IMPRESSION: Normal noncontrast CT appearance of the brain. No evidence of acute intracranial abnormality. Electronically Signed   By: KyKellie SimmeringO   On:  11/19/2019 17:05   DG Chest Portable 1 View  Result Date: 11/18/2019 CLINICAL DATA:  Infection, recent discharge for bacterial meningitis EXAM: PORTABLE CHEST 1 VIEW COMPARISON:  Radiograph 09/23/2015 FINDINGS: Left upper extremity PICC terminates at the mid SVC. No consolidation, features of edema, pneumothorax, or effusion. Pulmonary vascularity is normally distributed. The cardiomediastinal contours are unremarkable. No acute osseous or soft tissue abnormality. IMPRESSION: No acute cardiopulmonary abnormality. Electronically Signed   By: PrLovena Le.D.   On: 11/18/2019 22:46   ECHOCARDIOGRAM COMPLETE  Result Date: 11/19/2019   ECHOCARDIOGRAM REPORT   [facility]2019 Medical Rec #:  [id number]     Height:       66.0 in Accession #:    [id number]    Weight:       132.0 lb Date of Birth:  1/09-25-1982     BSA:          1.68 m Patient Age:    [age]        BP:  101/58 mmHg Patient Gender: F               HR:           48 bpm. Exam Location:  Inpatient Procedure: 2D Echo Indications:    Syncope 780.2 / R55  History:        Patient has no prior history of Echocardiogram examinations.                 Signs/Symptoms:Alzheimer's. Meningitis, headache, nausea.  Sonographer:    Darlina Sicilian RDCS Referring Phys: [id number]  1. Left ventricular ejection fraction, by visual estimation, is 60 to 65%. The left ventricle has normal function. There is no left ventricular hypertrophy.  2. The left ventricle has no regional wall motion abnormalities.  3. Global right ventricle has normal systolic function.The right ventricular size is normal. No increase in right ventricular wall thickness.  4. Left atrial size was moderately dilated.  5. Right atrial size was normal.  6. Small pericardial effusion.  7. The mitral valve is normal in structure. Trivial mitral valve regurgitation.  8. The tricuspid valve is normal in structure.  9. The tricuspid  valve is normal in structure. Tricuspid valve regurgitation is trivial. 10. The aortic valve has an indeterminant number of cusps. Aortic valve regurgitation is not visualized. No evidence of aortic valve sclerosis or stenosis. 11. The pulmonic valve was grossly normal. Pulmonic valve regurgitation is not visualized. 12. Normal pulmonary artery systolic pressure. FINDINGS  Left Ventricle: Left ventricular ejection fraction, by visual estimation, is 60 to 65%. The left ventricle has normal function. The left ventricle has no regional wall motion abnormalities. There is no left ventricular hypertrophy. Left ventricular diastolic parameters were normal. Right Ventricle: The right ventricular size is normal. No increase in right ventricular wall thickness. Global RV systolic function is has normal systolic function. The tricuspid regurgitant velocity is 1.75 m/s, and with an assumed right atrial pressure  of 3 mmHg, the estimated right ventricular systolic pressure is normal at 15.2 mmHg. Left Atrium: Left atrial size was moderately dilated. Right Atrium: Right atrial size was normal in size Pericardium: A small pericardial effusion is present. Mitral Valve: The mitral valve is normal in structure. Trivial mitral valve regurgitation. Tricuspid Valve: The tricuspid valve is normal in structure. Tricuspid valve regurgitation is trivial. Aortic Valve: The aortic valve has an indeterminant number of cusps. Aortic valve regurgitation is not visualized. The aortic valve is structurally normal, with no evidence of sclerosis or stenosis. Pulmonic Valve: The pulmonic valve was grossly normal. Pulmonic valve regurgitation is not visualized. Pulmonic regurgitation is not visualized. Aorta: The aortic root, ascending aorta, aortic arch and descending aorta are all structurally normal, with no evidence of dilitation or obstruction. Pulmonary Artery: The pulmonary artery is not well seen. IAS/Shunts: No atrial level shunt detected by  color flow Doppler.  LEFT VENTRICLE PLAX 2D LVIDd:         5.00 cm  Diastology LVIDs:         3.30 cm  LV e' lateral:   12.90 cm/s LV PW:         0.60 cm  LV E/e' lateral: 8.7 LV IVS:        0.80 cm  LV e' medial:    13.50 cm/s LVOT diam:     1.80 cm  LV E/e' medial:  8.3 LV SV:         74 ml LV SV Index:   44.40 LVOT  Area:     2.54 cm  RIGHT VENTRICLE RV S prime:     21.20 cm/s TAPSE (M-mode): 3.2 cm LEFT ATRIUM           Index       RIGHT ATRIUM           Index LA diam:      3.40 cm 2.03 cm/m  RA Area:     11.50 cm LA Vol (A2C): 30.1 ml 17.96 ml/m RA Volume:   22.60 ml  13.48 ml/m LA Vol (A4C): 68.3 ml 40.75 ml/m  AORTIC VALVE LVOT Vmax:   124.00 cm/s LVOT Vmean:  84.000 cm/s LVOT VTI:    0.260 m  AORTA Ao Root diam: 2.80 cm Ao Asc diam:  2.80 cm MITRAL VALVE                         TRICUSPID VALVE MV Area (PHT): 3.91 cm              TR Peak grad:   12.2 mmHg MV PHT:        56.26 msec            TR Vmax:        175.00 cm/s MV Decel Time: 194 msec MV E velocity: 112.00 cm/s 103 cm/s  SHUNTS MV A velocity: 74.70 cm/s  70.3 cm/s Systemic VTI:  0.26 m MV E/A ratio:  1.50        1.5       Systemic Diam: 1.80 cm  Buford Dresser MD Electronically signed by Buford Dresser MD Signature Date/Time: 11/19/2019/1:25:39 PM    Final      Subjective: Patient with improvement of her nausea, able to eat and drink more today. Her headache is persistent but not as severe. She reported seeing more floaters today, denied eye pain.   Discharge Exam: Vitals:   11/21/19 0900 11/21/19 1800  BP: (!) 117/51 139/68  Pulse: 63 87  Resp: 16   Temp: 98.3 F (36.8 C)   SpO2: 98%    Vitals:   11/20/19 2349 11/21/19 0454 11/21/19 0900 11/21/19 1800  BP:  (!) 160/63 (!) 117/51 139/68  Pulse:  61 63 87  Resp:  16 16   Temp: 98.8 F (37.1 C) 100.1 F (37.8 C) 98.3 F (36.8 C)   TempSrc: Oral Oral Oral   SpO2:  100% 98%   Weight:      Height:        General: Pt is alert, awake, not in acute  distress Cardiovascular: RRR, S1 and S2 present Respiratory: No respiratory distress, no wheezing, no rhonchi Abdominal: Soft, NT, ND Extremities: no edema, no cyanosis    The results of significant diagnostics from this hospitalization (including imaging, microbiology, ancillary and laboratory) are listed below for reference.     Microbiology: Recent Results (from the past 240 hour(s))  SARS CORONAVIRUS 2 (TAT 6-24 HRS) Nasopharyngeal Nasopharyngeal Swab     Status: None   Collection Time: 11/19/19  6:01 AM   Specimen: Nasopharyngeal Swab  Result Value Ref Range Status   SARS Coronavirus 2 NEGATIVE NEGATIVE Final    Comment: (NOTE) SARS-CoV-2 target nucleic acids are NOT DETECTED. The SARS-CoV-2 RNA is generally detectable in upper and lower respiratory specimens during the acute phase of infection. Negative results do not preclude SARS-CoV-2 infection, do not rule out co-infections with other pathogens, and should not be used as the sole basis for treatment or other  patient management decisions. Negative results must be combined with clinical observations, patient history, and epidemiological information. The expected result is Negative. Fact Sheet for Patients: SugarRoll.be Fact Sheet for Healthcare Providers: https://www.woods-mathews.com/ This test is not yet approved or cleared by the [place] FDA and  has been authorized for detection and/or diagnosis of SARS-CoV-2 by FDA under an Emergency Use Authorization (EUA). This EUA will remain  in effect (meaning this test can be used) for the duration of the COVID-19 declaration under Section 56 4(b)(1) of the Act, 21 U.S.C. section 360bbb-3(b)(1), unless the authorization is terminated or revoked sooner. Performed at [facility], [place] [address] [place], [place] [address]   Culture, blood (routine x 2)     Status: None (Preliminary result)   Collection Time: 11/19/19   5:15 PM   Specimen: BLOOD RIGHT ARM  Result Value Ref Range Status   Specimen Description BLOOD RIGHT ARM  Final   Special Requests   Final    BOTTLES DRAWN AEROBIC AND ANAEROBIC Blood Culture results may not be optimal due to an inadequate volume of blood received in culture bottles   Culture   Final    NO GROWTH 2 DAYS Performed at [facility], [place] [address] [place], [place] [address]    Report Status PENDING  Incomplete  Culture, blood (routine x 2)     Status: None (Preliminary result)   Collection Time: 11/19/19  5:20 PM   Specimen: BLOOD RIGHT HAND  Result Value Ref Range Status   Specimen Description BLOOD RIGHT HAND  Final   Special Requests   Final    AEROBIC BOTTLE ONLY Blood Culture results may not be optimal due to an inadequate volume of blood received in culture bottles   Culture   Final    NO GROWTH 2 DAYS Performed at [facility], [place] [address], [place], [place] [address]    Report Status PENDING  Incomplete     Labs: BNP (last 3 results) No results for input(s): BNP in the last 8760 hours. Basic Metabolic Panel: Recent Labs  Lab 11/18/19 2214 11/20/19 0439 11/21/19 0819  NA 140 139 138  K 3.5 3.1* 3.2*  CL 107 107 107  CO2 '22 23 23  ' GLUCOSE 119* 111* 111*  BUN 12 <5* <5*  CREATININE 0.52 0.41* 0.45  CALCIUM 8.4* 7.8* 8.0*  MG  --  1.8  --    Liver Function Tests: Recent Labs  Lab 11/18/19 2214  AST 14*  ALT 11  ALKPHOS 69  BILITOT 0.6  PROT 6.2*  ALBUMIN 3.2*   No results for input(s): LIPASE, AMYLASE in the last 168 hours. No results for input(s): AMMONIA in the last 168 hours. CBC: Recent Labs  Lab 11/18/19 2214 11/20/19 0439 11/21/19 0819  WBC 11.2* 10.7* 9.3  NEUTROABS 8.4*  --   --   HGB 11.6* 10.1* 9.8*  HCT 35.1* 30.3* 29.5*  MCV 82.4 82.8 81.9  PLT 403* 263 268   Cardiac Enzymes: No results for input(s): CKTOTAL, CKMB, CKMBINDEX, TROPONINI in the last 168 hours. BNP: Invalid input(s): POCBNP CBG: No  results for input(s): GLUCAP in the last 168 hours. D-Dimer No results for input(s): DDIMER in the last 72 hours. Hgb A1c No results for input(s): HGBA1C in the last 72 hours. Lipid Profile No results for input(s): CHOL, HDL, LDLCALC, TRIG, CHOLHDL, LDLDIRECT in the last 72 hours. Thyroid function studies Recent Labs    11/19/19 1414  TSH 1.395  Anemia work up No results for input(s): VITAMINB12, FOLATE, FERRITIN, TIBC, IRON, RETICCTPCT in the last 72 hours. Urinalysis    Component Value Date/Time   COLORURINE YELLOW 11/18/2019 2206   APPEARANCEUR CLOUDY (A) 11/18/2019 2206   LABSPEC 1.028 11/18/2019 2206   PHURINE 7.0 11/18/2019 2206   GLUCOSEU NEGATIVE 11/18/2019 2206   HGBUR SMALL (A) 11/18/2019 2206   BILIRUBINUR NEGATIVE 11/18/2019 2206   KETONESUR NEGATIVE 11/18/2019 2206   PROTEINUR NEGATIVE 11/18/2019 2206   NITRITE NEGATIVE 11/18/2019 2206   LEUKOCYTESUR NEGATIVE 11/18/2019 2206   Sepsis Labs Invalid input(s): PROCALCITONIN,  WBC,  LACTICIDVEN Microbiology Recent Results (from the past 240 hour(s))  SARS CORONAVIRUS 2 (TAT 6-24 HRS) Nasopharyngeal Nasopharyngeal Swab     Status: None   Collection Time: 11/19/19  6:01 AM   Specimen: Nasopharyngeal Swab  Result Value Ref Range Status   SARS Coronavirus 2 NEGATIVE NEGATIVE Final    Comment: (NOTE) SARS-CoV-2 target nucleic acids are NOT DETECTED. The SARS-CoV-2 RNA is generally detectable in upper and lower respiratory specimens during the acute phase of infection. Negative results do not preclude SARS-CoV-2 infection, do not rule out co-infections with other pathogens, and should not be used as the sole basis for treatment or other patient management decisions. Negative results must be combined with clinical observations, patient history, and epidemiological information. The expected result is Negative. Fact Sheet for Patients: SugarRoll.be Fact Sheet for Healthcare  Providers: https://www.woods-mathews.com/ This test is not yet approved or cleared by the [place] FDA and  has been authorized for detection and/or diagnosis of SARS-CoV-2 by FDA under an Emergency Use Authorization (EUA). This EUA will remain  in effect (meaning this test can be used) for the duration of the COVID-19 declaration under Section 56 4(b)(1) of the Act, 21 U.S.C. section 360bbb-3(b)(1), unless the authorization is terminated or revoked sooner. Performed at [facility], [place] [address] [place], [place] [address]   Culture, blood (routine x 2)     Status: None (Preliminary result)   Collection Time: 11/19/19  5:15 PM   Specimen: BLOOD RIGHT ARM  Result Value Ref Range Status   Specimen Description BLOOD RIGHT ARM  Final   Special Requests   Final    BOTTLES DRAWN AEROBIC AND ANAEROBIC Blood Culture results may not be optimal due to an inadequate volume of blood received in culture bottles   Culture   Final    NO GROWTH 2 DAYS Performed at [facility], [place] [address], [place], [place] [address]    Report Status PENDING  Incomplete  Culture, blood (routine x 2)     Status: None (Preliminary result)   Collection Time: 11/19/19  5:20 PM   Specimen: BLOOD RIGHT HAND  Result Value Ref Range Status   Specimen Description BLOOD RIGHT HAND  Final   Special Requests   Final    AEROBIC BOTTLE ONLY Blood Culture results may not be optimal due to an inadequate volume of blood received in culture bottles   Culture   Final    NO GROWTH 2 DAYS Performed at [facility], [place] [address] [place], [place] [address]    Report Status PENDING  Incomplete     Time coordinating discharge: Over 33 minutes  SIGNED:   Blain Pais, MD  [facility] 11/21/2019, 6:44 PM

## 2019-11-21 NOTE — Progress Notes (Signed)
Visited Mrs. Dell this morning. She appreciated the visit and would like the chaplain to follow up with her later on. Will continue to provide spiritual care as she needs.  Rev. Margaretann Loveless Chaplain M. Div.

## 2019-11-21 NOTE — Progress Notes (Signed)
Patient had a temperature of 102.9 during shift change. Pt has discharge orders to go home tonight. Bodenheimer,NP notified via amion and ordered overnight observation. Tylenol prn given at this time.  Will continue to monitor.   Ardie Dragoo,RN.

## 2019-11-21 NOTE — Telephone Encounter (Signed)
Patient's husband left a message stating he missed Dr. Fatima Sanger call yesterday. Requesting a call back to discuss patient being in hospital, and medication questions.

## 2019-11-21 NOTE — Telephone Encounter (Signed)
I called Brad's cell phone which his wife picked up.  She states they decided to keep her in the hospital because she has decreased appetite and nausea.  They have been giving her antiemetics and it is not working.  She states the Toradol is been really hard on her stomach.  She will ask for antacids or maybe a different anti-inflammatory.

## 2019-11-21 NOTE — Consult Note (Signed)
CC:  Chief Complaint  Patient presents with  . Headache    HPI: Dawn [name] is a 39 y.o. female w/ POH of RE and PMH of RA who was admitted for presumed meningitis. Started to have new floaters in right eye more noticeable in bright lights or white background. Started w/in last 24 hours. No pain. Not wearing CL or glasses since admission.   ROS: As per HPI  PMH: Past Medical History:  Diagnosis Date  . Arthritis   . H/O laparoscopy 05/05/13  . History of ectopic pregnancy 05/05/13  . Hx of unilateral salpingectomy 05/05/13   Left  . Hx of wisdom tooth extraction   . Medical history non-contributory     PSH: Past Surgical History:  Procedure Laterality Date  . LAPAROSCOPY N/A 05/06/2013   Procedure: LAPAROSCOPY OPERATIVE, Left Salpingectomy, Removal Ectopic Pregnancy;  Surgeon: Mickel Baas, MD;  Location: [facility];  Service: Gynecology;  Laterality: N/A;  . WISDOM TOOTH EXTRACTION      Meds: No current facility-administered medications on file prior to encounter.   Current Outpatient Medications on File Prior to Encounter  Medication Sig Dispense Refill  . Abatacept (ORENCIA IV) Inject into the vein every 30 (thirty) days.     . cefTRIAXone (ROCEPHIN) 2 g injection Inject 2 g into the muscle every 12 (twelve) hours.    Marland Kitchen linezolid (ZYVOX) 600 MG tablet Take 600 mg by mouth 2 (two) times daily.    . promethazine (PHENERGAN) 12.5 MG tablet Take 12.5 mg by mouth every 6 (six) hours as needed for nausea or vomiting.      SH: Social History   Socioeconomic History  . Marital status: Married    Spouse name: Not on file  . Number of children: Not on file  . Years of education: Not on file  . Highest education level: Not on file  Occupational History  . Not on file  Tobacco Use  . Smoking status: Former Smoker    Packs/day: 0.50    Types: Cigarettes    Quit date: 08/28/2001    Years since quitting: 18.2  . Smokeless tobacco: Never Used  Substance and Sexual  Activity  . Alcohol use: No  . Drug use: No  . Sexual activity: Yes    Birth control/protection: None  Other Topics Concern  . Not on file  Social History Narrative  . Not on file   Social Determinants of Health   Financial Resource Strain:   . Difficulty of Paying Living Expenses: Not on file  Food Insecurity:   . Worried About Programme researcher, broadcasting/film/video in the Last Year: Not on file  . Ran Out of Food in the Last Year: Not on file  Transportation Needs:   . Lack of Transportation (Medical): Not on file  . Lack of Transportation (Non-Medical): Not on file  Physical Activity:   . Days of Exercise per Week: Not on file  . Minutes of Exercise per Session: Not on file  Stress:   . Feeling of Stress : Not on file  Social Connections:   . Frequency of Communication with Friends and Family: Not on file  . Frequency of Social Gatherings with Friends and Family: Not on file  . Attends Religious Services: Not on file  . Active Member of Clubs or Organizations: Not on file  . Attends Banker Meetings: Not on file  . Marital Status: Not on file    FH: Family History  Problem Relation Age of  Onset  . Psoriasis Sister     Exam:  Lucianne Lei: OD: 3 pt [place] OS: 3 pt [place]  CVF: OD: full OS: full  EOM: OD: full d/v OS: full d/v  Pupils: OD: 3->2 mm, no APD OS: 3->2 mm, no APD  IOP: by [place] OD: 18 OS: 17  External: OD: no periorbital edema, no proptosis, good orbicularis strength OS: no periorbital edema, no proptosis, good orbicularis strength    Pen Light Exam: L/L: OD: WNL OS: WNL  C/S: OD: white and quiet OS: white and quiet  K: OD: clear, no abnormal staining OS: clear, no abnormal staining  A/C: OD: grossly deep and quiet appearing by pen light OS: grossly deep and quiet appearing by pen light  I: OD: round and regular OS: round and regular  L: OD: NSC OS: NSC  DFE: dilated @ 5:05 PMw/ [place] and Phenyl OU  V: OD: clear  OS:  clear  N: OD: C/D 0.1, no disc edema OS: C/D 0.1, no disc edema  M: OD: flat, no obvious macular pathology OS: flat, no obvious macular pathology  V: OD: normal appearing vessels OS: normal appearing vessels  P: OD: retina flat 360, no obvious mass/RT/RD OS: retina flat 360, no obvious mass/RT/RD  A/P:  1. Floaters OD: - Discussed exam at bedside limited but suspect possible Posterior Vitreous Detachment - Reassured no evidence of retinal tear or detachment and no evidence of endophthalmitis - Recommend outpatient evaluation in 2-3 weeks  Kassius Battiste T. Manuella Ghazi, [place] [phone]

## 2019-11-22 DIAGNOSIS — G030 Nonpyogenic meningitis: Secondary | ICD-10-CM

## 2019-11-22 LAB — BASIC METABOLIC PANEL
Anion gap: 9 (ref 5–15)
BUN: <5 mg/dL — ABNORMAL LOW (ref 6–20)
CO2: 23 mmol/L (ref 22–32)
Calcium: 7.8 mg/dL — ABNORMAL LOW (ref 8.9–10.3)
Chloride: 101 mmol/L (ref 98–111)
Creatinine, Ser: 0.51 mg/dL (ref 0.44–1.00)
GFR calc Af Amer: >60 mL/min (ref >60)
GFR calc non Af Amer: >60 mL/min (ref >60)
Glucose, Bld: 116 mg/dL — ABNORMAL HIGH (ref 70–99)
Potassium: 3.2 mmol/L — ABNORMAL LOW (ref 3.5–5.1)
Sodium: 133 mmol/L — ABNORMAL LOW (ref 135–145)

## 2019-11-22 LAB — CBC
HCT: 31.3 % — ABNORMAL LOW (ref 36.0–46.0)
Hemoglobin: 10.2 g/dL — ABNORMAL LOW (ref 12.0–15.0)
MCH: 27.6 pg (ref 26.0–34.0)
MCHC: 32.6 g/dL (ref 30.0–36.0)
MCV: 84.6 fL (ref 80.0–100.0)
Platelets: 275 10*3/uL (ref 150–400)
RBC: 3.7 MIL/uL — ABNORMAL LOW (ref 3.87–5.11)
RDW: 13.3 % (ref 11.5–15.5)
WBC: 10.8 10*3/uL — ABNORMAL HIGH (ref 4.0–10.5)
nRBC: 0 % (ref 0.0–0.2)

## 2019-11-22 MED ORDER — HYDROCODONE-ACETAMINOPHEN 5-325 MG PO TABS
1.0000 | ORAL_TABLET | ORAL | Status: DC | PRN
  Administered 2019-11-22 – 2019-11-25 (×6): 1 via ORAL
  Filled 2019-11-22 (×7): qty 1

## 2019-11-22 MED ORDER — POTASSIUM CHLORIDE CRYS ER 20 MEQ PO TBCR
40.0000 meq | EXTENDED_RELEASE_TABLET | Freq: Once | ORAL | Status: AC
  Administered 2019-11-22: 40 meq via ORAL
  Filled 2019-11-22: qty 2

## 2019-11-22 MED ORDER — PREDNISONE 20 MG PO TABS
40.0000 mg | ORAL_TABLET | Freq: Every day | ORAL | Status: DC
  Administered 2019-11-22 – 2019-11-24 (×3): 40 mg via ORAL
  Filled 2019-11-22 (×3): qty 2

## 2019-11-22 NOTE — Care Management (Signed)
Spoke Pam w [facility]. Patient has Rocephin at home already. Pam taught spouse how to administer IV Abx Friday.  HH RN orders are in Epic. No other CM needs.   Patient is ready for DC from CM standpoint.

## 2019-11-22 NOTE — Progress Notes (Addendum)
PROGRESS NOTE    Dawn [name]  KDX:[id number] DOB: 22-Jan-1981 DOA: 11/18/2019 PCP: Don Broach, [facility]   Brief Narrative:39 y.o. female with medical history significant for rheumatoid arthritis on Orencia, recent admission with suspected meningitis, now presenting to the emergency department for evaluation of headache, nausea, loose stools, and syncope.  Patient reports that she developed headache and nausea on 11/08/2019, went on to have fevers and neck pain as well, was admitted to [facility] in [place], had LP that was felt to be concerning for possible bacterial meningitis, she was treated with Decadron, ampicillin, Rocephin, and vancomycin, and admitted to the hospital there.  It does not appear that any organisms were seen in the CSF, no positive cultures, and viral panels were all negative.  She had PICC placed and was discharged yesterday with plan to continue Rocephin and linezolid.  Since returning home, she has continued headaches and nausea, was seated on the toilet having loose stools, and then suffered a transient loss of consciousness without any chest pain.  Patient denies any focal numbness or weakness and there has not been any seizure-like activity.  She reports having a fever again since returning home from the hospital yesterday.  Denies respiratory symptoms.  She reports multiple negative COVID-19 tests since onset of her symptoms.  ED Course: Upon arrival to the ED, patient is found to be afebrile, saturating well on room air, bradycardic in the 50s, and with blood pressure 104/66.  Orthostatic vital signs are negative.  EKG with sinus bradycardia, rate 51.  Chest x-ray is negative for acute cardiopulmonary disease.  Chemistry panel is unremarkable.  CBC with leukocytosis to 11,200, mild normocytic anemia, and mild thrombocytosis.  Initial lactic acid was 2.0.  COVID-19 screening test not yet resulted.  Patient was treated with 3 L of IV fluids, Rocephin,  linezolid, Toradol, Zofran, and Compazine in the ED.  On 1/29 patient had felt better, had increase in oral intake with some improvement of her nausea and headache. Had floaters, was seen by Ophthalmology with recommendations for outpatient follow up. Patient was discharged. However, at the time of discharge, patient was noted to have fever of 102.22F (fever curve had trended down to 100.5-100.24F until then). Discharge orders were discontinued.   1/30. Patient reported increase in nausea. Had fever again in am. ID consulted again. Dr. Drue Second seeing patient. Patient to continue same antibiotic treatment and will start prednisone as requested by the patient--this medication had not been started during this hospitalization as it was not recommended by ID and Neurosurgery with discussion by Dr. Ashley Royalty with the patient's Rheumatologist, Dr. Dicie Beam see Dr. Anastasio Auerbach progress note for further details.   Assessment & Plan:   Principal Problem:   Meningitis Active Problems:   Rheumatoid arthritis with rheumatoid factor of multiple sites without organ or systems involvement (HCC)   Syncope   #1 Meningitis - Diagnosed at [facility] discharged home on IV antibiotics Rocephin and Zyvox admitted here with worsening headache. Unclear if aseptic or bacterial but treated empirically for bacterial meningitis due to immosuppressed state.  Per Dr. Jennette Bill, ID physician seeing her at [facility], patient was discharged with treatment of Rocephin and Zyvox x 2 weeks. Patient did not have decadron Rx after discharge as recommended per Dr. Jennette Bill: "Discontinue Dexamethasone given negative testing for H.flu/Meningococcus/Pneumococcus as little definite therapeutic benefit in absence of these organisms" -Patient had persistent low grade fever, decreased oral intake and headache after her discharge from [facility] and had syncopal episode.  -She  was admitted to [facility] and started on Toradol, Reglan, and  sumatriptan with some improvement but the headache has been persistent. Patient had requested to be started on prednisone but this treatment had not been recommended by ID (Dr. Linus Salmons) due to potential exacerbation of her infectious process--patient reported to me that Dr. Linus Salmons had shared with her that bacterial meningitis was less likely but could not be completely ruled out and she agreed with the decision to not start prednisone as of 1/29.   -While here, she has been seen by Neurosurgery, Dr. Christella Noa, with recommendation to continue NSAID treatment and no neurological changes noted.  -She complained of increase in floaters on 1/29 and was seen by Dr. Manuella Ghazi in Ophthalmology who suspected possible posterior vitreous detachment and recommended outpatient follow up in 2-3 weeks. Her floaters are persistent but with no decrease in vision today.  -She continues to be nauseated with not as much po intake today due to the fevers--she reported being able to drink a smoothie, eat crackers, Jell O, and tolerated more liquids yesterday and was feeling better then.  Given the fever curve trending up, ID consulted again on 1/30. Patient seen by Dr. Graylon Good. Appreciate recommendations. Patient to continue Rocephin and Zyvox. Will have possible repeat LP tomorrow.Patient started on prednisone per ID.   #2 history of rheumatoid arthritis: followed by Dr. Arrie Aran on hold.  #3 syncope likely secondary to hypotension and severe headache.  She was in sinus bradycardia on admission which has been resolved.  Normal TSH normal echo. Her oral intake is decreased again today. Will continue IV fluids. Will continue Zofran and phenergan as needed for nausea.   #4 Nausea. With possible GERD. Patient on NSAIDs at home, reports that she was taking Ibuprofen. Has had nausea but also stomach discomfort.  Started Protonix on 1/29, will continue simethicone as needed as well.  -Continue prn zofran, phenergan, and reglan.    -Of note, per nursing, patient refusion scheduled Reglan and not wanting to take anti-nausea medications.Still not eating or drinking much today. -Discontinued Toradol now that she will be on prednisone to prevent increased risk of gastritis and GI bleeding.   #5. Headache.  Patient requesting to be started on Norco as needed for headache instead of morphine.  -Started norco as needed, discontinued morphine.     Nutrition Problem: Inadequate oral intake Etiology: nausea     Signs/Symptoms: per patient/family report    Interventions: Boost Breeze, MVI  Estimated body mass index is 22.03 kg/m as calculated from the following:   Height as of this encounter: 5\' 6"  (1.676 m).   Weight as of this encounter: 61.9 kg.  DVT prophylaxis: SCDs Code Status: Full code Family Communication: Discussed with the patient.   Disposition Plan:  patient came from home plan is to discharge her home seen by PT.  Barriers to discharge patient not eating or drinking for safe discharge.  Encourage her to eat and drink she has been on IV fluids and is still on IV hydration. Consultants: Infectious disease, Neurosurgery.    Procedures: None Antimicrobials: Rocephin and Zyvox Subjective: She is tired. She feels nauseated. Reports that she had recurrent fever throughout the night. Able to take only small sips of fluids this morning.   Objective: Vitals:   11/21/19 2147 11/22/19 0525 11/22/19 0908 11/22/19 1130  BP: (!) 123/58 (!) 146/65 (!) 150/58   Pulse: 68 62 68   Resp: 16 18 18    Temp: 99.1 F (37.3 C) 98.9 F (  37.2 C) (!) 101.4 F (38.6 C) 98.9 F (37.2 C)  TempSrc: Oral Oral Oral Oral  SpO2: 97% 97% 100%   Weight: 61.9 kg     Height:        Intake/Output Summary (Last 24 hours) at 11/22/2019 1640 Last data filed at 11/22/2019 1529 Gross per 24 hour  Intake 3901.22 ml  Output 0 ml  Net 3901.22 ml   Filed Weights   11/19/19 2300 11/20/19 2112 11/21/19 2147  Weight: 63.9 kg 63.2  kg 61.9 kg    Examination:  General exam: Appears calm and comfortable. Tired appearing.  Respiratory system: Respiratory effort normal. No wheezing.  Cardiovascular system: S1 & S2 present, RRR.  Gastrointestinal system: Abdomen is nondistended, soft and nontender.  Central nervous system: Alert and oriented x3. No facial droop. Normal speech. Following commands. Asking and answering questions appropriately.  Extremities: Symmetric 5 x 5 power. Skin: No rashes, lesions or ulcers Psychiatry: Mood & affect appropriate.     Data Reviewed: I have personally reviewed following labs and imaging studies  CBC: Recent Labs  Lab 11/18/19 2214 11/20/19 0439 11/21/19 0819 11/22/19 0815  WBC 11.2* 10.7* 9.3 10.8*  NEUTROABS 8.4*  --   --   --   HGB 11.6* 10.1* 9.8* 10.2*  HCT 35.1* 30.3* 29.5* 31.3*  MCV 82.4 82.8 81.9 84.6  PLT 403* 263 268 275   Basic Metabolic Panel: Recent Labs  Lab 11/18/19 2214 11/20/19 0439 11/21/19 0819 11/22/19 0815  NA 140 139 138 133*  K 3.5 3.1* 3.2* 3.2*  CL 107 107 107 101  CO2 22 23 23 23   GLUCOSE 119* 111* 111* 116*  BUN 12 <5* <5* <5*  CREATININE 0.52 0.41* 0.45 0.51  CALCIUM 8.4* 7.8* 8.0* 7.8*  MG  --  1.8  --   --    GFR: Estimated Creatinine Clearance: 87.5 mL/min (by C-G formula based on SCr of 0.51 mg/dL). Liver Function Tests: Recent Labs  Lab 11/18/19 2214  AST 14*  ALT 11  ALKPHOS 69  BILITOT 0.6  PROT 6.2*  ALBUMIN 3.2*   No results for input(s): LIPASE, AMYLASE in the last 168 hours. No results for input(s): AMMONIA in the last 168 hours. Coagulation Profile: No results for input(s): INR, PROTIME in the last 168 hours. Cardiac Enzymes: No results for input(s): CKTOTAL, CKMB, CKMBINDEX, TROPONINI in the last 168 hours. BNP (last 3 results) No results for input(s): PROBNP in the last 8760 hours. HbA1C: No results for input(s): HGBA1C in the last 72 hours. CBG: No results for input(s): GLUCAP in the last 168  hours. Lipid Profile: No results for input(s): CHOL, HDL, LDLCALC, TRIG, CHOLHDL, LDLDIRECT in the last 72 hours. Thyroid Function Tests: No results for input(s): TSH, T4TOTAL, FREET4, T3FREE, THYROIDAB in the last 72 hours. Anemia Panel: No results for input(s): VITAMINB12, FOLATE, FERRITIN, TIBC, IRON, RETICCTPCT in the last 72 hours. Sepsis Labs: Recent Labs  Lab 11/18/19 2214 11/19/19 0241  LATICACIDVEN 2.0* 1.5    Recent Results (from the past 240 hour(s))  SARS CORONAVIRUS 2 (TAT 6-24 HRS) Nasopharyngeal Nasopharyngeal Swab     Status: None   Collection Time: 11/19/19  6:01 AM   Specimen: Nasopharyngeal Swab  Result Value Ref Range Status   SARS Coronavirus 2 NEGATIVE NEGATIVE Final    Comment: (NOTE) SARS-CoV-2 target nucleic acids are NOT DETECTED. The SARS-CoV-2 RNA is generally detectable in upper and lower respiratory specimens during the acute phase of infection. Negative results do not preclude SARS-CoV-2  infection, do not rule out co-infections with other pathogens, and should not be used as the sole basis for treatment or other patient management decisions. Negative results must be combined with clinical observations, patient history, and epidemiological information. The expected result is Negative. Fact Sheet for Patients: HairSlick.no Fact Sheet for Healthcare Providers: quierodirigir.com This test is not yet approved or cleared by the [place] FDA and  has been authorized for detection and/or diagnosis of SARS-CoV-2 by FDA under an Emergency Use Authorization (EUA). This EUA will remain  in effect (meaning this test can be used) for the duration of the COVID-19 declaration under Section 56 4(b)(1) of the Act, 21 U.S.C. section 360bbb-3(b)(1), unless the authorization is terminated or revoked sooner. Performed at [facility], [address], [place], [place] [address]   Culture, blood  (routine x 2)     Status: None (Preliminary result)   Collection Time: 11/19/19  5:15 PM   Specimen: BLOOD RIGHT ARM  Result Value Ref Range Status   Specimen Description BLOOD RIGHT ARM  Final   Special Requests   Final    BOTTLES DRAWN AEROBIC AND ANAEROBIC Blood Culture results may not be optimal due to an inadequate volume of blood received in culture bottles   Culture   Final    NO GROWTH 3 DAYS Performed at [facility], [address], [place], [place] [address]    Report Status PENDING  Incomplete  Culture, blood (routine x 2)     Status: None (Preliminary result)   Collection Time: 11/19/19  5:20 PM   Specimen: BLOOD RIGHT HAND  Result Value Ref Range Status   Specimen Description BLOOD RIGHT HAND  Final   Special Requests   Final    AEROBIC BOTTLE ONLY Blood Culture results may not be optimal due to an inadequate volume of blood received in culture bottles   Culture   Final    NO GROWTH 3 DAYS Performed at [facility], [address], [place], [place] [address]    Report Status PENDING  Incomplete         Radiology Studies: No results found.      Scheduled Meds: . Chlorhexidine Gluconate Cloth  6 each Topical Daily  . feeding supplement  1 Container Oral TID BM  . ketorolac  7.5 mg Intravenous Q8H  . metoCLOPramide (REGLAN) injection  5 mg Intravenous Q8H  . multivitamin with minerals  1 tablet Oral Daily  . pantoprazole  40 mg Oral Daily  . predniSONE  40 mg Oral Q breakfast  . sodium chloride flush  3 mL Intravenous Once  . sodium chloride flush  3 mL Intravenous Q12H   Continuous Infusions: . sodium chloride 100 mL/hr at 11/22/19 0950  . cefTRIAXone (ROCEPHIN)  IV 2 g (11/22/19 1524)  . linezolid (ZYVOX) IV 600 mg (11/22/19 1129)     LOS: 3 days     Ky Barban, MD [facility]  If 7PM-7AM, please contact night-coverage [email or web address] Password [email or web address] 11/22/2019, 4:40 PM

## 2019-11-22 NOTE — Progress Notes (Signed)
Patient's discharge was cancelled due to the patient having fever of 102.55F.

## 2019-11-22 NOTE — Progress Notes (Addendum)
[facility]    Date of Admission:  11/18/2019   Total days of antibiotics 6           ID: Dawn [name] is a 39 y.o. female with RA on orencia(anti TNF) admitted for fever, ha, nuchal rigidity to [facility] on 1/23 found to be treated for aseptic meningitis (though initial tap had neutrophilic predominance) cx and viral studies negative Principal Problem:   Meningitis Active Problems:   Rheumatoid arthritis with rheumatoid factor of multiple sites without organ or systems involvement (HCC)   Syncope    Subjective: Remains to have fevers up to 102 last night and 101 today. Still feeling poorly with neck pain.  Medications:  . Chlorhexidine Gluconate Cloth  6 each Topical Daily  . feeding supplement  1 Container Oral TID BM  . ketorolac  7.5 mg Intravenous Q8H  . metoCLOPramide (REGLAN) injection  5 mg Intravenous Q8H  . multivitamin with minerals  1 tablet Oral Daily  . pantoprazole  40 mg Oral Daily  . sodium chloride flush  3 mL Intravenous Once  . sodium chloride flush  3 mL Intravenous Q12H    Objective: Vital signs in last 24 hours: Temp:  [98.9 F (37.2 C)-102.6 F (39.2 C)] 98.9 F (37.2 C) (01/30 1130) Pulse Rate:  [62-87] 68 (01/30 0908) Resp:  [16-18] 18 (01/30 0908) BP: (123-150)/(58-68) 150/58 (01/30 0908) SpO2:  [97 %-100 %] 100 % (01/30 0908) Weight:  [61.9 kg] 61.9 kg (01/29 2147) Physical Exam  Constitutional:  oriented to person, place, and time. appears well-developed and well-nourished. mild distress.  HENT: /AT, PERRLA, no scleral icterus Mouth/Throat: Oropharynx is clear and moist. No oropharyngeal exudate.  Cardiovascular: Normal rate, regular rhythm and normal heart sounds. Exam reveals no gallop and no friction rub.  No murmur heard.  Pulmonary/Chest: Effort normal and breath sounds normal. No respiratory distress.  has no wheezes.  Neck = supple, + nuchal rigidity Neurological: alert and oriented to person, place,  and time.  Skin: Skin is warm and dry. No rash noted. No erythema.  Psychiatric: a normal mood and affect.  behavior is normal.    Lab Results Recent Labs    11/21/19 0819 11/22/19 0815  WBC 9.3 10.8*  HGB 9.8* 10.2*  HCT 29.5* 31.3*  NA 138 133*  K 3.2* 3.2*  CL 107 101  CO2 23 23  BUN <5* <5*  CREATININE 0.45 0.51    Microbiology: 1/27 blood cx ngtd Studies/Results: Reviewed outside records and serology, csf cx nad csf studies - still no growth todate   Assessment/Plan: 39yo F with symptoms consistent with meningitis, presumably aseptic meningitis - since work up thus far is negative. She was placed on 14 day course of linezolid plus ceftriaxone for possible bacterial meningitis given her LP at first, on 1/23, showed 873 total cells with 81% TP elevated at 264 and glu normal at 41. encepahlitis work up panel was negative x 2. Repeat LP, on 1/25, showed CSF of 227 with 74%L, TP 45 and GLU 58. It is not unusual to see neutrophilic predominance initially with aseptic meningitis. Due to being immuncompromised host, she was treated with 13 d of linezolid and ceftriaxone. She is day 6-7 of abtx but now having much improvement today with new onset of fever.  Would recommend to continue with supportive care and can start steroids to see if helps headache. Will also get LP to ensure still having improvement in symptoms and check opening pressure.  Will check aerobic/anaerobic csf cx as well.  Continue with linezolid and ceftriaxone.  [facility] Cell: [phone] Pager: [phone]  11/22/2019, 11:54 AM

## 2019-11-22 NOTE — Plan of Care (Signed)

## 2019-11-23 LAB — BASIC METABOLIC PANEL
Anion gap: 10 (ref 5–15)
BUN: 5 mg/dL — ABNORMAL LOW (ref 6–20)
CO2: 24 mmol/L (ref 22–32)
Calcium: 8 mg/dL — ABNORMAL LOW (ref 8.9–10.3)
Chloride: 102 mmol/L (ref 98–111)
Creatinine, Ser: 0.42 mg/dL — ABNORMAL LOW (ref 0.44–1.00)
GFR calc Af Amer: >60 mL/min (ref >60)
GFR calc non Af Amer: >60 mL/min (ref >60)
Glucose, Bld: 129 mg/dL — ABNORMAL HIGH (ref 70–99)
Potassium: 3.7 mmol/L (ref 3.5–5.1)
Sodium: 136 mmol/L (ref 135–145)

## 2019-11-23 LAB — CBC WITH DIFFERENTIAL/PLATELET
Abs Immature Granulocytes: 0.08 10*3/uL — ABNORMAL HIGH (ref 0.00–0.07)
Basophils Absolute: 0 10*3/uL (ref 0.0–0.1)
Basophils Relative: 0 %
Eosinophils Absolute: 0 10*3/uL (ref 0.0–0.5)
Eosinophils Relative: 0 %
HCT: 32.1 % — ABNORMAL LOW (ref 36.0–46.0)
Hemoglobin: 10.5 g/dL — ABNORMAL LOW (ref 12.0–15.0)
Immature Granulocytes: 1 %
Lymphocytes Relative: 11 %
Lymphs Abs: 0.7 10*3/uL (ref 0.7–4.0)
MCH: 27.4 pg (ref 26.0–34.0)
MCHC: 32.7 g/dL (ref 30.0–36.0)
MCV: 83.8 fL (ref 80.0–100.0)
Monocytes Absolute: 0.2 10*3/uL (ref 0.1–1.0)
Monocytes Relative: 3 %
Neutro Abs: 5.5 10*3/uL (ref 1.7–7.7)
Neutrophils Relative %: 85 %
Platelets: 286 10*3/uL (ref 150–400)
RBC: 3.83 MIL/uL — ABNORMAL LOW (ref 3.87–5.11)
RDW: 13.2 % (ref 11.5–15.5)
WBC: 6.5 10*3/uL (ref 4.0–10.5)
nRBC: 0 % (ref 0.0–0.2)

## 2019-11-23 MED ORDER — GENERIC EXTERNAL MEDICATION
Status: DC
Start: ? — End: 2019-11-23

## 2019-11-23 NOTE — Progress Notes (Signed)
Patients sister called and expressed her concerns regarding the patients vision impairment and is requesting that a neurologist come and see the patient. Patient is also concerned. MD notified. MD stated that she would put in a consult for neurology. Patient and husband notified of this information.

## 2019-11-23 NOTE — Progress Notes (Addendum)
PROGRESS NOTE    Dawn [name]  KGM:[id number] DOB: 1981/08/31 DOA: 11/18/2019 PCP: Haydee Monica, [facility]   Brief Narrative:39 y.o. female with medical history significant for rheumatoid arthritis on Orencia, recent admission with suspected meningitis, now presenting to the emergency department for evaluation of headache, nausea, loose stools, and syncope.  Patient reports that she developed headache and nausea on 11/08/2019, went on to have fevers and neck pain as well, was admitted to [facility] in [place], had LP that was felt to be concerning for possible bacterial meningitis, she was treated with Decadron, ampicillin, Rocephin, and vancomycin, and admitted to the hospital there.  It does not appear that any organisms were seen in the CSF, no positive cultures, and viral panels were all negative.  She had PICC placed and was discharged yesterday with plan to continue Rocephin and linezolid.  Since returning home, she has continued headaches and nausea, was seated on the toilet having loose stools, and then suffered a transient loss of consciousness without any chest pain.  Patient denies any focal numbness or weakness and there has not been any seizure-like activity.  She reports having a fever again since returning home from the hospital yesterday.  Denies respiratory symptoms.  She reports multiple negative COVID-19 tests since onset of her symptoms.  ED Course: Upon arrival to the ED, patient is found to be afebrile, saturating well on room air, bradycardic in the 50s, and with blood pressure 104/66.  Orthostatic vital signs are negative.  EKG with sinus bradycardia, rate 51.  Chest x-ray is negative for acute cardiopulmonary disease.  Chemistry panel is unremarkable.  CBC with leukocytosis to 11,200, mild normocytic anemia, and mild thrombocytosis.  Initial lactic acid was 2.0.  COVID-19 screening test not yet resulted.  Patient was treated with 3 L of IV fluids, Rocephin,  linezolid, Toradol, Zofran, and Compazine in the ED.  On 1/29 patient had felt better, had increase in oral intake with some improvement of her nausea and headache. Had floaters, was seen by Ophthalmology with recommendations for outpatient follow up. Patient was discharged. However, at the time of discharge, patient was noted to have fever of 102.34F (fever curve had trended down to 100.5-100.36F until then). Discharge orders were discontinued.   1/30. Patient reported increase in nausea. Had fever again in am. ID consulted again. Dr. Baxter Flattery seeing patient. Patient to continue same antibiotic treatment and will start prednisone as requested by the patient--this medication had not been started during this hospitalization as it was not recommended by ID and Neurosurgery with discussion by Dr. Zigmund Daniel with the patient's Rheumatologist, Dr. Zenda Alpers see Dr. West Pugh progress note for further details.   1/31: Patient reports that she is eating more, her nausea has improved but she continues to have headache while ambulating (headache is better when she is resting). Her vision continues to have floaters.   Assessment & Plan:   Principal Problem:   Meningitis Active Problems:   Rheumatoid arthritis with rheumatoid factor of multiple sites without organ or systems involvement ([place])   Syncope   #1 Meningitis - -Diagnosed at [facility] discharged home on IV antibiotics Rocephin and Zyvox, received treatment with IV Dexamethasone daily while inpatient but did not have steroids Rx for home use. -Per Dr. Layne Benton, [place] physician seeing her at [facility], patient was discharged with treatment of Rocephin and Zyvox x 2 weeks. Patient did not have decadron Rx after discharge as recommended per Dr. Layne Benton: "Discontinue Dexamethasone given negative testing for H.flu/Meningococcus/Pneumococcus as  little definite therapeutic benefit in absence of these organisms" -Unclear if aseptic or bacterial but  treated empirically for bacterial meningitis due to immosuppressed state. Was discharged home on 1/26.  -Was home for about 2 hours, had a syncopal episode in the bathroom, was admitted here with worsening headache.  -After being admitted at [facility], she was started on Toradol, Reglan, and sumatriptan with some improvement but the headache has been persistent. Patient had requested to be started on prednisone but this treatment had not been recommended by ID (Dr. Luciana Axe) due to potential exacerbation of her infectious process--patient reported to me that Dr. Luciana Axe had shared with her that bacterial meningitis was less likely but could not be completely ruled out and she agreed with the decision to not start prednisone as of 1/29.   -While here, she has been seen by Neurosurgery, Dr. Franky Macho, with recommendation to continue NSAID treatment and no neurological changes noted per his exam. Neurosurgery has signed off.  -She complained of increase in floaters in visual fields on 1/29 and was seen by Dr. Sherryll Burger in Ophthalmology who suspected possible posterior vitreous detachment and recommended outpatient follow up in 2-3 weeks. Her floaters are persistent but with no decrease in vision.   -She continues to be nauseated with slight improvement of her nausea and fever.  -Given the fever curve trending up, ID consulted again on 1/30. Patient seen by Dr. Drue Second. Appreciate recommendations. Patient to continue Rocephin and Zyvox. Will have possible repeat LP tomorrow.Patient started on prednisone per ID.  -Neurology consult requested on 1/31 due to floaters in visual fields. On-call Neurologist, Dr. Jerrell Belfast, reported no need for formal consult at this time but recommended MRI brain with and without contrast and to call them back if abnormal findings other than meningitis changes. Recommended IV Depakote and IV compazine x1 for breakthrough headache relief. I discussed these recommendations with the patient, she wanted to  discuss them with her husband, [name], and would notify her nurse if she wanted to proceed with the MRI or with the IV medications as recommended by Neurology.   Addendum: the patient wants to proceed with MRI brain. MRI brain w and wo contrast ordered.   #2 history of rheumatoid arthritis: followed by Dr. Corliss Skains. Orencia on hold.  #3 syncope likely secondary to hypotension and severe headache.  She was in sinus bradycardia on admission which has been resolved.  Normal TSH and normal echo. Her oral intake is slightly improved today. Will continue IV fluids. Will continue Zofran and phenergan as needed for nausea.   #4 Nausea. With possible GERD. Patient on NSAIDs at home, reports that she was taking Ibuprofen. Has had nausea but also stomach discomfort.  Started Protonix on 1/29, will continue simethicone as needed as well.  -Continue prn zofran, phenergan, and reglan.  -Per nursing, patient not requesting anti-nausea medications today.Still not eating or drinking much today, will continue IV fluids. -Discontinued Toradol now that she will be on prednisone to prevent increased risk of gastritis and GI bleeding.   #5. Headache.  Patient requested to be started on Norco as needed for headache instead of morphine.  -Cotninue norco as needed, morphine has been discontinued.      Nutrition Problem: Inadequate oral intake Etiology: nausea     Signs/Symptoms: per patient/family report    Interventions: Boost Breeze, MVI  Estimated body mass index is 22.03 kg/m as calculated from the following:   Height as of this encounter: 5\' 6"  (1.676 m).   Weight as  of this encounter: 61.9 kg.  DVT prophylaxis: SCDs Code Status: Full code Family Communication: Discussed with the patient. Discussed with her husband at the bedside.   Disposition Plan:  patient came from home. Discharge home once clinically improved. She has been seen by PT.  Barriers to discharge patient not eating or drinking  for safe discharge.  Encourage her to eat and drink she has been on IV fluids and is still on IV hydration. Consultants: Infectious disease, Neurosurgery, Ophthalmology.    Procedures: None Antimicrobials: Rocephin and Zyvox Subjective: Reports that she slept well last night. Continues to have headache but only when ambulating. Fever curve has trended down. Eating slightly more, her nausea is improving.    Objective: Vitals:   11/22/19 1641 11/22/19 2011 11/23/19 0511 11/23/19 0942  BP: 128/60 (!) 114/57 135/64 (!) 138/59  Pulse: 71 83 70 64  Resp: 18 16 16 18   Temp: (!) 100.6 F (38.1 C) 99.8 F (37.7 C) 98.5 F (36.9 C) 98.3 F (36.8 C)  TempSrc: Oral Oral Oral Oral  SpO2: 99% 96% 99% 98%  Weight:      Height:        Intake/Output Summary (Last 24 hours) at 11/23/2019 1610 Last data filed at 11/23/2019 1315 Gross per 24 hour  Intake 2477.85 ml  Output 0 ml  Net 2477.85 ml   Filed Weights   11/19/19 2300 11/20/19 2112 11/21/19 2147  Weight: 63.9 kg 63.2 kg 61.9 kg    Examination:  General exam: Appears calm and comfortable.  Respiratory system: Respiratory effort normal. No wheezing.  Cardiovascular system: S1 & S2 present, RRR.  Gastrointestinal system: Abdomen is nondistended, soft and nontender.  Central nervous system: Alert and oriented x3. No facial droop. Normal speech. Following commands. Asking and answering questions appropriately.  Extremities: Symmetric 5 x 5 power. Skin: No rashes, lesions or ulcers Psychiatry: Mood & affect appropriate.     Data Reviewed: I have personally reviewed following labs and imaging studies  CBC: Recent Labs  Lab 11/18/19 2214 11/20/19 0439 11/21/19 0819 11/22/19 0815 11/23/19 0340  WBC 11.2* 10.7* 9.3 10.8* 6.5  NEUTROABS 8.4*  --   --   --  5.5  HGB 11.6* 10.1* 9.8* 10.2* 10.5*  HCT 35.1* 30.3* 29.5* 31.3* 32.1*  MCV 82.4 82.8 81.9 84.6 83.8  PLT 403* 263 268 275 286   Basic Metabolic Panel: Recent Labs    Lab 11/18/19 2214 11/20/19 0439 11/21/19 0819 11/22/19 0815 11/23/19 0340  NA 140 139 138 133* 136  K 3.5 3.1* 3.2* 3.2* 3.7  CL 107 107 107 101 102  CO2 22 23 23 23 24   GLUCOSE 119* 111* 111* 116* 129*  BUN 12 <5* <5* <5* 5*  CREATININE 0.52 0.41* 0.45 0.51 0.42*  CALCIUM 8.4* 7.8* 8.0* 7.8* 8.0*  MG  --  1.8  --   --   --    GFR: Estimated Creatinine Clearance: 87.5 mL/min (A) (by C-G formula based on SCr of 0.42 mg/dL (L)). Liver Function Tests: Recent Labs  Lab 11/18/19 2214  AST 14*  ALT 11  ALKPHOS 69  BILITOT 0.6  PROT 6.2*  ALBUMIN 3.2*   No results for input(s): LIPASE, AMYLASE in the last 168 hours. No results for input(s): AMMONIA in the last 168 hours. Coagulation Profile: No results for input(s): INR, PROTIME in the last 168 hours. Cardiac Enzymes: No results for input(s): CKTOTAL, CKMB, CKMBINDEX, TROPONINI in the last 168 hours. BNP (last 3 results) No results for  input(s): PROBNP in the last 8760 hours. HbA1C: No results for input(s): HGBA1C in the last 72 hours. CBG: No results for input(s): GLUCAP in the last 168 hours. Lipid Profile: No results for input(s): CHOL, HDL, LDLCALC, TRIG, CHOLHDL, LDLDIRECT in the last 72 hours. Thyroid Function Tests: No results for input(s): TSH, T4TOTAL, FREET4, T3FREE, THYROIDAB in the last 72 hours. Anemia Panel: No results for input(s): VITAMINB12, FOLATE, FERRITIN, TIBC, IRON, RETICCTPCT in the last 72 hours. Sepsis Labs: Recent Labs  Lab 11/18/19 2214 11/19/19 0241  LATICACIDVEN 2.0* 1.5    Recent Results (from the past 240 hour(s))  SARS CORONAVIRUS 2 (TAT 6-24 HRS) Nasopharyngeal Nasopharyngeal Swab     Status: None   Collection Time: 11/19/19  6:01 AM   Specimen: Nasopharyngeal Swab  Result Value Ref Range Status   SARS Coronavirus 2 NEGATIVE NEGATIVE Final    Comment: (NOTE) SARS-CoV-2 target nucleic acids are NOT DETECTED. The SARS-CoV-2 RNA is generally detectable in upper and  lower respiratory specimens during the acute phase of infection. Negative results do not preclude SARS-CoV-2 infection, do not rule out co-infections with other pathogens, and should not be used as the sole basis for treatment or other patient management decisions. Negative results must be combined with clinical observations, patient history, and epidemiological information. The expected result is Negative. Fact Sheet for Patients: HairSlick.no Fact Sheet for Healthcare Providers: quierodirigir.com This test is not yet approved or cleared by the [place] FDA and  has been authorized for detection and/or diagnosis of SARS-CoV-2 by FDA under an Emergency Use Authorization (EUA). This EUA will remain  in effect (meaning this test can be used) for the duration of the COVID-19 declaration under Section 56 4(b)(1) of the Act, 21 U.S.C. section 360bbb-3(b)(1), unless the authorization is terminated or revoked sooner. Performed at [facility], [address], [place], [place] [address]   Culture, blood (routine x 2)     Status: None (Preliminary result)   Collection Time: 11/19/19  5:15 PM   Specimen: BLOOD RIGHT ARM  Result Value Ref Range Status   Specimen Description BLOOD RIGHT ARM  Final   Special Requests   Final    BOTTLES DRAWN AEROBIC AND ANAEROBIC Blood Culture results may not be optimal due to an inadequate volume of blood received in culture bottles   Culture   Final    NO GROWTH 4 DAYS Performed at [facility], [address], [place], [place] [address]    Report Status PENDING  Incomplete  Culture, blood (routine x 2)     Status: None (Preliminary result)   Collection Time: 11/19/19  5:20 PM   Specimen: BLOOD RIGHT HAND  Result Value Ref Range Status   Specimen Description BLOOD RIGHT HAND  Final   Special Requests   Final    AEROBIC BOTTLE ONLY Blood Culture results may not be optimal due to an  inadequate volume of blood received in culture bottles   Culture   Final    NO GROWTH 4 DAYS Performed at [facility], [address], [place], [place] [address]    Report Status PENDING  Incomplete         Radiology Studies: No results found.      Scheduled Meds: . Chlorhexidine Gluconate Cloth  6 each Topical Daily  . feeding supplement  1 Container Oral TID BM  . metoCLOPramide (REGLAN) injection  5 mg Intravenous Q8H  . multivitamin with minerals  1 tablet Oral  Daily  . pantoprazole  40 mg Oral Daily  . predniSONE  40 mg Oral Q breakfast  . sodium chloride flush  3 mL Intravenous Once  . sodium chloride flush  3 mL Intravenous Q12H   Continuous Infusions: . sodium chloride 100 mL/hr at 11/22/19 2019  . cefTRIAXone (ROCEPHIN)  IV 2 g (11/23/19 1434)  . linezolid (ZYVOX) IV 600 mg (11/23/19 0943)     LOS: 4 days   Time spent: 35 minutes, including discussion of the plan with the patient and her husband.   Ky Barban, MD [facility]  If 7PM-7AM, please contact night-coverage [email or web address] Password [email or web address] 11/23/2019, 4:10 PM

## 2019-11-23 NOTE — Progress Notes (Signed)
    [facility]    Date of Admission:  11/18/2019   Total days of antibiotics7          ID: Dawn [name] is a 39 y.o. female with RA with presumed aseptic meningitis, but still getting treated with linezolid/ampicillin with ongoing fever and headache Principal Problem:   Meningitis Active Problems:   Rheumatoid arthritis with rheumatoid factor of multiple sites without organ or systems involvement ([facility])   Syncope    Subjective: She feels somewhat better since initiation of steroids and norco instead of toradol. Has floaters but mostly still having significant headache onset when sitting up or ambulating  Medications:  . Chlorhexidine Gluconate Cloth  6 each Topical Daily  . feeding supplement  1 Container Oral TID BM  . metoCLOPramide (REGLAN) injection  5 mg Intravenous Q8H  . multivitamin with minerals  1 tablet Oral Daily  . pantoprazole  40 mg Oral Daily  . predniSONE  40 mg Oral Q breakfast  . sodium chloride flush  3 mL Intravenous Once  . sodium chloride flush  3 mL Intravenous Q12H    Objective: Vital signs in last 24 hours: Temp:  [98.3 F (36.8 C)-100.6 F (38.1 C)] 98.3 F (36.8 C) (01/31 0942) Pulse Rate:  [64-83] 64 (01/31 0942) Resp:  [16-18] 18 (01/31 0942) BP: (114-138)/(57-64) 138/59 (01/31 0942) SpO2:  [96 %-99 %] 98 % (01/31 0942) Physical Exam  Constitutional:  oriented to person, place, and time. appears well-developed and well-nourished. No distress.  HENT: [place]/AT, PERRLA, no scleral icterus Mouth/Throat: Oropharynx is clear and moist. No oropharyngeal exudate.  Cardiovascular: Normal rate, regular rhythm and normal heart sounds. Exam reveals no gallop and no friction rub.  No murmur heard.  Pulmonary/Chest: Effort normal and breath sounds normal. No respiratory distress.  has no wheezes.  Neck = supple, no nuchal rigidity Abdominal: Soft. Bowel sounds are normal.  exhibits no distension. There is no tenderness.  Back:  no fluctuation at needle site from prior LP Lymphadenopathy: no cervical adenopathy. No axillary adenopathy Neurological: alert and oriented to person, place, and time.  Skin: Skin is warm and dry. No rash noted. No erythema.  Psychiatric: a normal mood and affect.  behavior is normal.    Lab Results Recent Labs    11/22/19 0815 11/23/19 0340  WBC 10.8* 6.5  HGB 10.2* 10.5*  HCT 31.3* 32.1*  NA 133* 136  K 3.2* 3.7  CL 101 102  CO2 23 24  BUN <5* 5*  CREATININE 0.51 0.42*    Microbiology: Blood cx ngtd Studies/Results: No results found.   Assessment/Plan: Aseptic meningitis = agree with plan to get repeat brain MRI to see if meningeal enhancement vs. Other findings to explain ongoing symptoms. Headache is thought to still be associated with aseptic meningitis. Will defer LP for now. Continue with linezolid and ampicillin to finish original 14 day course. Appears better  Would continue with pred 40mg  daily to also help symptoms for now.  DR dam to see tomorrow  [facility] Cell: [phone] Pager: [phone]  11/23/2019, 4:29 PM

## 2019-11-24 ENCOUNTER — Inpatient Hospital Stay (HOSPITAL_COMMUNITY): Payer: BC Managed Care – PPO

## 2019-11-24 DIAGNOSIS — R930 Abnormal findings on diagnostic imaging of skull and head, not elsewhere classified: Secondary | ICD-10-CM

## 2019-11-24 DIAGNOSIS — I776 Arteritis, unspecified: Secondary | ICD-10-CM

## 2019-11-24 DIAGNOSIS — R519 Headache, unspecified: Secondary | ICD-10-CM

## 2019-11-24 LAB — CBC WITH DIFFERENTIAL/PLATELET
Abs Immature Granulocytes: 0.03 10*3/uL (ref 0.00–0.07)
Basophils Absolute: 0 10*3/uL (ref 0.0–0.1)
Basophils Relative: 0 %
Eosinophils Absolute: 0 10*3/uL (ref 0.0–0.5)
Eosinophils Relative: 0 %
HCT: 29.4 % — ABNORMAL LOW (ref 36.0–46.0)
Hemoglobin: 9.6 g/dL — ABNORMAL LOW (ref 12.0–15.0)
Immature Granulocytes: 0 %
Lymphocytes Relative: 30 %
Lymphs Abs: 2 10*3/uL (ref 0.7–4.0)
MCH: 27.6 pg (ref 26.0–34.0)
MCHC: 32.7 g/dL (ref 30.0–36.0)
MCV: 84.5 fL (ref 80.0–100.0)
Monocytes Absolute: 0.6 10*3/uL (ref 0.1–1.0)
Monocytes Relative: 9 %
Neutro Abs: 4.1 10*3/uL (ref 1.7–7.7)
Neutrophils Relative %: 61 %
Platelets: 265 10*3/uL (ref 150–400)
RBC: 3.48 MIL/uL — ABNORMAL LOW (ref 3.87–5.11)
RDW: 13.3 % (ref 11.5–15.5)
WBC: 6.7 10*3/uL (ref 4.0–10.5)
nRBC: 0 % (ref 0.0–0.2)

## 2019-11-24 LAB — BASIC METABOLIC PANEL
Anion gap: 9 (ref 5–15)
BUN: 5 mg/dL — ABNORMAL LOW (ref 6–20)
CO2: 25 mmol/L (ref 22–32)
Calcium: 7.9 mg/dL — ABNORMAL LOW (ref 8.9–10.3)
Chloride: 105 mmol/L (ref 98–111)
Creatinine, Ser: 0.43 mg/dL — ABNORMAL LOW (ref 0.44–1.00)
GFR calc Af Amer: >60 mL/min (ref >60)
GFR calc non Af Amer: >60 mL/min (ref >60)
Glucose, Bld: 101 mg/dL — ABNORMAL HIGH (ref 70–99)
Potassium: 3.1 mmol/L — ABNORMAL LOW (ref 3.5–5.1)
Sodium: 139 mmol/L (ref 135–145)

## 2019-11-24 LAB — CULTURE, BLOOD (ROUTINE X 2)
Culture: NO GROWTH
Culture: NO GROWTH

## 2019-11-24 MED ORDER — GENERIC EXTERNAL MEDICATION
Status: DC
Start: ? — End: 2019-11-24

## 2019-11-24 MED ORDER — PANTOPRAZOLE SODIUM 40 MG IV SOLR
40.0000 mg | INTRAVENOUS | Status: DC
  Administered 2019-11-24 – 2019-11-28 (×5): 40 mg via INTRAVENOUS
  Filled 2019-11-24 (×5): qty 40

## 2019-11-24 MED ORDER — POTASSIUM CHLORIDE 10 MEQ/100ML IV SOLN
10.0000 meq | INTRAVENOUS | Status: AC
  Administered 2019-11-24 (×4): 10 meq via INTRAVENOUS
  Filled 2019-11-24 (×4): qty 100

## 2019-11-24 MED ORDER — LINEZOLID 600 MG PO TABS
600.0000 mg | ORAL_TABLET | Freq: Two times a day (BID) | ORAL | Status: DC
  Administered 2019-11-24 – 2019-11-29 (×9): 600 mg via ORAL
  Filled 2019-11-24 (×11): qty 1

## 2019-11-24 MED ORDER — GADOBUTROL 1 MMOL/ML IV SOLN: 7.5000 mL | Freq: Once | INTRAVENOUS | Status: DC | PRN

## 2019-11-24 MED ORDER — METHYLPREDNISOLONE SODIUM SUCC 40 MG IJ SOLR
40.0000 mg | Freq: Every day | INTRAMUSCULAR | Status: DC
  Administered 2019-11-24 – 2019-11-25 (×2): 40 mg via INTRAVENOUS
  Filled 2019-11-24 (×2): qty 1

## 2019-11-24 MED ORDER — GADOBUTROL 1 MMOL/ML IV SOLN
6.0000 mL | Freq: Once | INTRAVENOUS | Status: AC | PRN
  Administered 2019-11-24: 6 mL via INTRAVENOUS

## 2019-11-24 NOTE — Progress Notes (Addendum)
PROGRESS NOTE    Dawn [name]   JFH:[id number]  DOB: 07-10-1981  DOA: 11/18/2019 PCP: Pc, [facility]   Brief Narrative:  Dawn [name] 39 y.o.femalewith medical history significant forrheumatoid arthritis on Orencia who was recently admitted at [facility] for suspected meningitis and discharged on empiric Rocephin, oral Zyvoxx. After getting home, she passed out and returned to our ER with ongoing complaints of headaches, nausea, vomiting and weakness.  Subjective: Ongoing headache and nausea. She vomited all her meds this AM.     Assessment & Plan:   Principal Problem:   Meningitis? ongoing headaches, nausea, vomiting, syncope - MRI today reveals numerous lesions- Neuro consulted for more input- I spoke with Dr Cheral Marker - appreciate ID follow up  Active Problems:   Rheumatoid arthritis  - receiving Orencia as oupt    Syncope - ? Vasovagal - may have been dehydrated  Time spent in minutes: 35 DVT prophylaxis: SCDs Code Status: Full code Family Communication:  Disposition Plan: cont treatment for Meningitis f/u on neuro input Consultants:   ID  Neuro Procedures:     Antimicrobials:  Anti-infectives (From admission, onward)   Start     Dose/Rate Route Frequency Ordered Stop   11/24/19 2000  linezolid (ZYVOX) tablet 600 mg     600 mg Oral Every 12 hours 11/24/19 1109     11/21/19 0000  cefTRIAXone (ROCEPHIN) IVPB     2 g Intravenous Every 12 hours 11/21/19 1839 12/01/19 2359   11/19/19 2000  linezolid (ZYVOX) IVPB 600 mg  Status:  Discontinued     600 mg 300 mL/hr over 60 Minutes Intravenous Every 12 hours 11/19/19 1556 11/24/19 1109   11/19/19 1600  vancomycin (VANCOREADY) IVPB 1250 mg/250 mL  Status:  Discontinued     1,250 mg 166.7 mL/hr over 90 Minutes Intravenous Every 8 hours 11/19/19 0548 11/19/19 1556   11/19/19 1400  cefTRIAXone (ROCEPHIN) 2 g in sodium chloride 0.9 % 100 mL IVPB     2 g 200 mL/hr over 30 Minutes Intravenous Every 12  hours 11/19/19 0530     11/19/19 0230  linezolid (ZYVOX) IVPB 600 mg     600 mg 300 mL/hr over 60 Minutes Intravenous  Once 11/19/19 0201 11/19/19 0552   11/19/19 0215  cefTRIAXone (ROCEPHIN) 2 g in sodium chloride 0.9 % 100 mL IVPB     2 g 200 mL/hr over 30 Minutes Intravenous  Once 11/19/19 0201 11/19/19 0305       Objective: Vitals:   11/23/19 2138 11/23/19 2138 11/24/19 0558 11/24/19 0922  BP: 133/70 133/70 (!) 143/61 130/66  Pulse: (!) 56 (!) 54 63 (!) 58  Resp: 18 18 16 18   Temp: 98.4 F (36.9 C) 98.4 F (36.9 C) 98.1 F (36.7 C) 98.7 F (37.1 C)  TempSrc: Oral Oral Oral Oral  SpO2: 99% 99% 100% 99%  Weight: 60.8 kg     Height:        Intake/Output Summary (Last 24 hours) at 11/24/2019 1610 Last data filed at 11/24/2019 1500 Gross per 24 hour  Intake 2457.06 ml  Output 300 ml  Net 2157.06 ml   Filed Weights   11/20/19 2112 11/21/19 2147 11/23/19 2138  Weight: 63.2 kg 61.9 kg 60.8 kg    Examination: General exam: Appears comfortable  HEENT: PERRLA, oral mucosa moist, no sclera icterus or thrush Respiratory system: Clear to auscultation. Respiratory effort normal. Cardiovascular system: S1 & S2 heard, RRR.   Gastrointestinal system: Abdomen soft, non-tender, nondistended. Normal bowel  sounds. Central nervous system: Alert and oriented. No focal neurological deficits. Extremities: No cyanosis, clubbing or edema Skin: No rashes or ulcers Psychiatry:  depressed     Data Reviewed: I have personally reviewed following labs and imaging studies  CBC: Recent Labs  Lab 11/18/19 2214 11/18/19 2214 11/20/19 0439 11/21/19 0819 11/22/19 0815 11/23/19 0340 11/24/19 0411  WBC 11.2*   < > 10.7* 9.3 10.8* 6.5 6.7  NEUTROABS 8.4*  --   --   --   --  5.5 4.1  HGB 11.6*   < > 10.1* 9.8* 10.2* 10.5* 9.6*  HCT 35.1*   < > 30.3* 29.5* 31.3* 32.1* 29.4*  MCV 82.4   < > 82.8 81.9 84.6 83.8 84.5  PLT 403*   < > 263 268 275 286 265   < > = values in this interval not  displayed.   Basic Metabolic Panel: Recent Labs  Lab 11/20/19 0439 11/21/19 0819 11/22/19 0815 11/23/19 0340 11/24/19 0411  NA 139 138 133* 136 139  K 3.1* 3.2* 3.2* 3.7 3.1*  CL 107 107 101 102 105  CO2 23 23 23 24 25   GLUCOSE 111* 111* 116* 129* 101*  BUN <5* <5* <5* 5* 5*  CREATININE 0.41* 0.45 0.51 0.42* 0.43*  CALCIUM 7.8* 8.0* 7.8* 8.0* 7.9*  MG 1.8  --   --   --   --    GFR: Estimated Creatinine Clearance: 87.5 mL/min (A) (by C-G formula based on SCr of 0.43 mg/dL (L)). Liver Function Tests: Recent Labs  Lab 11/18/19 2214  AST 14*  ALT 11  ALKPHOS 69  BILITOT 0.6  PROT 6.2*  ALBUMIN 3.2*   No results for input(s): LIPASE, AMYLASE in the last 168 hours. No results for input(s): AMMONIA in the last 168 hours. Coagulation Profile: No results for input(s): INR, PROTIME in the last 168 hours. Cardiac Enzymes: No results for input(s): CKTOTAL, CKMB, CKMBINDEX, TROPONINI in the last 168 hours. BNP (last 3 results) No results for input(s): PROBNP in the last 8760 hours. HbA1C: No results for input(s): HGBA1C in the last 72 hours. CBG: No results for input(s): GLUCAP in the last 168 hours. Lipid Profile: No results for input(s): CHOL, HDL, LDLCALC, TRIG, CHOLHDL, LDLDIRECT in the last 72 hours. Thyroid Function Tests: No results for input(s): TSH, T4TOTAL, FREET4, T3FREE, THYROIDAB in the last 72 hours. Anemia Panel: No results for input(s): VITAMINB12, FOLATE, FERRITIN, TIBC, IRON, RETICCTPCT in the last 72 hours. Urine analysis:    Component Value Date/Time   COLORURINE YELLOW 11/18/2019 2206   APPEARANCEUR CLOUDY (A) 11/18/2019 2206   LABSPEC 1.028 11/18/2019 2206   PHURINE 7.0 11/18/2019 2206   GLUCOSEU NEGATIVE 11/18/2019 2206   HGBUR SMALL (A) 11/18/2019 2206   BILIRUBINUR NEGATIVE 11/18/2019 2206   KETONESUR NEGATIVE 11/18/2019 2206   PROTEINUR NEGATIVE 11/18/2019 2206   NITRITE NEGATIVE 11/18/2019 2206   LEUKOCYTESUR NEGATIVE 11/18/2019 2206    Sepsis Labs: @LABRCNTIP (procalcitonin:4,lacticidven:4) ) Recent Results (from the past 240 hour(s))  SARS CORONAVIRUS 2 (TAT 6-24 HRS) Nasopharyngeal Nasopharyngeal Swab     Status: None   Collection Time: 11/19/19  6:01 AM   Specimen: Nasopharyngeal Swab  Result Value Ref Range Status   SARS Coronavirus 2 NEGATIVE NEGATIVE Final    Comment: (NOTE) SARS-CoV-2 target nucleic acids are NOT DETECTED. The SARS-CoV-2 RNA is generally detectable in upper and lower respiratory specimens during the acute phase of infection. Negative results do not preclude SARS-CoV-2 infection, do not rule out co-infections with other  pathogens, and should not be used as the sole basis for treatment or other patient management decisions. Negative results must be combined with clinical observations, patient history, and epidemiological information. The expected result is Negative. Fact Sheet for Patients: HairSlick.no Fact Sheet for Healthcare Providers: quierodirigir.com This test is not yet approved or cleared by the [place] FDA and  has been authorized for detection and/or diagnosis of SARS-CoV-2 by FDA under an Emergency Use Authorization (EUA). This EUA will remain  in effect (meaning this test can be used) for the duration of the COVID-19 declaration under Section 56 4(b)(1) of the Act, 21 U.S.C. section 360bbb-3(b)(1), unless the authorization is terminated or revoked sooner. Performed at [facility], [address] [place], [place] [address]   Culture, blood (routine x 2)     Status: None   Collection Time: 11/19/19  5:15 PM   Specimen: BLOOD RIGHT ARM  Result Value Ref Range Status   Specimen Description BLOOD RIGHT ARM  Final   Special Requests   Final    BOTTLES DRAWN AEROBIC AND ANAEROBIC Blood Culture results may not be optimal due to an inadequate volume of blood received in culture bottles   Culture   Final    NO  GROWTH 5 DAYS Performed at [facility], [address] [place], [place] [address]    Report Status 11/24/2019 FINAL  Final  Culture, blood (routine x 2)     Status: None   Collection Time: 11/19/19  5:20 PM   Specimen: BLOOD RIGHT HAND  Result Value Ref Range Status   Specimen Description BLOOD RIGHT HAND  Final   Special Requests   Final    AEROBIC BOTTLE ONLY Blood Culture results may not be optimal due to an inadequate volume of blood received in culture bottles   Culture   Final    NO GROWTH 5 DAYS Performed at [facility], [address] [place], [place] [address]    Report Status 11/24/2019 FINAL  Final         Radiology Studies: MR BRAIN W WO CONTRAST  Result Date: 11/24/2019 CLINICAL DATA:  Initial evaluation for meningitis, CNS infection, headache. EXAM: MRI HEAD WITHOUT AND WITH CONTRAST TECHNIQUE: Multiplanar, multiecho pulse sequences of the brain and surrounding structures were obtained without and with intravenous contrast. CONTRAST:  31mL GADAVIST GADOBUTROL 1 MMOL/ML IV SOLN COMPARISON:  Prior head CT from 11/19/2019. FINDINGS: Brain: Cerebral volume within normal limits for age. There are a few scattered subcentimeter foci of FLAIR signal abnormality seen along several cortical sulci, most pronounced within the left frontal lobe (series 11, image 19, 18), and left parietal lobe (series 11, image 20). Subtle FLAIR signal intensity seen along several cortical sulci within the parietooccipital regions (series 11, image 13, 12). Following contrast administration, there is subtle leptomeningeal enhancement, most notable posteriorly. Constellation of findings most suggestive of meningitis, consistent with provided history. Note made of an additional 1 cm FLAIR lesion involving the right periatrial white matter (series 11, image 14), somewhat more indeterminate, but suspected to be related underlying infection/meningitis as well, as this lesion demonstrates subtle  postcontrast enhancement (series 19, image 10). Small capillary telangiectasia or possibly demyelinating lesion would be the primary differential considerations. No other focal parenchymal signal abnormality. No abnormal foci of restricted diffusion to suggest acute or subacute ischemia. Gray-white matter differentiation maintained. No encephalomalacia to suggest chronic cortical infarction. Minimal susceptibility artifact noted associated with the right periatrial lesion (series 14,  image 29). No other evidence for acute or chronic intracranial hemorrhage. No mass lesion, midline shift or mass effect. No hydrocephalus. No visible intraventricular debris. Pituitary gland suprasellar region normal. Midline structures intact. Vascular: Major intracranial vascular flow voids are maintained. Skull and upper cervical spine: Craniocervical junction within normal limits. Upper cervical spine normal. Bone marrow signal intensity within normal limits. No scalp soft tissue abnormality. Sinuses/Orbits: Globes and orbital soft tissues demonstrate no acute finding. Paranasal sinuses are clear. No mastoid effusion. Inner ear structures grossly normal. Other: None. IMPRESSION: 1. Few scattered subcentimeter foci of FLAIR signal abnormality involving the juxtacortical white matter and cortical sulci of both cerebral hemispheres with associated subtle leptomeningeal enhancement. Constellation of findings suggestive of meningitis. 2. 1 cm FLAIR lesion involving the right periatrial white matter as above, somewhat more indeterminate, although suspected to be related to underlying infection/meningitis as well. Small capillary telangiectasia or demyelinating lesion would be the primary differential considerations. 3. Otherwise normal brain MRI. No other acute intracranial process identified. Electronically Signed   By: Rise Mu M.D.   On: 11/24/2019 04:36      Scheduled Meds: . Chlorhexidine Gluconate Cloth  6 each  Topical Daily  . feeding supplement  1 Container Oral TID BM  . linezolid  600 mg Oral Q12H  . methylPREDNISolone (SOLU-MEDROL) injection  40 mg Intravenous Daily  . metoCLOPramide (REGLAN) injection  5 mg Intravenous Q8H  . multivitamin with minerals  1 tablet Oral Daily  . pantoprazole (PROTONIX) IV  40 mg Intravenous Q24H  . sodium chloride flush  3 mL Intravenous Once  . sodium chloride flush  3 mL Intravenous Q12H   Continuous Infusions: . sodium chloride 100 mL/hr at 11/24/19 1239  . cefTRIAXone (ROCEPHIN)  IV 2 g (11/24/19 1455)  . potassium chloride 10 mEq (11/24/19 1551)     LOS: 5 days      Calvert Cantor, MD [facility] Pager: [email or web address] Password [email or web address] 11/24/2019, 4:10 PM

## 2019-11-24 NOTE — Progress Notes (Signed)
Notified MD Rizwan of k=3.1.  Leonia Reeves, RN, BSN.

## 2019-11-24 NOTE — Progress Notes (Addendum)
Subjective: Still with no improvement in her headache   Antibiotics:  Anti-infectives (From admission, onward)   Start     Dose/Rate Route Frequency Ordered Stop   11/24/19 2000  linezolid (ZYVOX) tablet 600 mg     600 mg Oral Every 12 hours 11/24/19 1109     11/21/19 0000  cefTRIAXone (ROCEPHIN) IVPB     2 g Intravenous Every 12 hours 11/21/19 1839 12/01/19 2359   11/19/19 2000  linezolid (ZYVOX) IVPB 600 mg  Status:  Discontinued     600 mg 300 mL/hr over 60 Minutes Intravenous Every 12 hours 11/19/19 1556 11/24/19 1109   11/19/19 1600  vancomycin (VANCOREADY) IVPB 1250 mg/250 mL  Status:  Discontinued     1,250 mg 166.7 mL/hr over 90 Minutes Intravenous Every 8 hours 11/19/19 0548 11/19/19 1556   11/19/19 1400  cefTRIAXone (ROCEPHIN) 2 g in sodium chloride 0.9 % 100 mL IVPB     2 g 200 mL/hr over 30 Minutes Intravenous Every 12 hours 11/19/19 0530     11/19/19 0230  linezolid (ZYVOX) IVPB 600 mg     600 mg 300 mL/hr over 60 Minutes Intravenous  Once 11/19/19 0201 11/19/19 0552   11/19/19 0215  cefTRIAXone (ROCEPHIN) 2 g in sodium chloride 0.9 % 100 mL IVPB     2 g 200 mL/hr over 30 Minutes Intravenous  Once 11/19/19 0201 11/19/19 0305      Medications: Scheduled Meds: . Chlorhexidine Gluconate Cloth  6 each Topical Daily  . feeding supplement  1 Container Oral TID BM  . linezolid  600 mg Oral Q12H  . methylPREDNISolone (SOLU-MEDROL) injection  40 mg Intravenous Daily  . metoCLOPramide (REGLAN) injection  5 mg Intravenous Q8H  . multivitamin with minerals  1 tablet Oral Daily  . pantoprazole (PROTONIX) IV  40 mg Intravenous Q24H  . sodium chloride flush  3 mL Intravenous Once  . sodium chloride flush  3 mL Intravenous Q12H   Continuous Infusions: . sodium chloride 100 mL/hr at 11/24/19 1239  . cefTRIAXone (ROCEPHIN)  IV 2 g (11/24/19 0340)  . potassium chloride 10 mEq (11/24/19 1342)   PRN Meds:.acetaminophen, gadobutrol, heparin lock flush,  HYDROcodone-acetaminophen, ondansetron (ZOFRAN) IV, promethazine, simethicone, sodium chloride flush, SUMAtriptan    Objective: Weight change:   Intake/Output Summary (Last 24 hours) at 11/24/2019 1421 Last data filed at 11/24/2019 1025 Gross per 24 hour  Intake 2031.32 ml  Output 300 ml  Net 1731.32 ml   Blood pressure 130/66, pulse (!) 58, temperature 98.7 F (37.1 C), temperature source Oral, resp. rate 18, height 5\' 6"  (1.676 m), weight 60.8 kg, SpO2 99 %, unknown if currently breastfeeding. Temp:  [98.1 F (36.7 C)-98.7 F (37.1 C)] 98.7 F (37.1 C) (02/01 0922) Pulse Rate:  [54-81] 58 (02/01 0922) Resp:  [16-18] 18 (02/01 0922) BP: (112-143)/(61-70) 130/66 (02/01 0922) SpO2:  [97 %-100 %] 99 % (02/01 0922) Weight:  [60.8 kg] 60.8 kg (01/31 2138)  Physical Exam: General: Alert and awake, oriented x3, not in any acute distress. HEENT: anicteric sclera, EOMI CVS regular rate, normal  Chest: , no wheezing, no respiratory distress Abdomen: soft non-distended,  Extremities: no edema or deformity noted bilaterally Skin: no rashes Neuro: nonfocal  CBC:    BMET Recent Labs    11/23/19 0340 11/24/19 0411  NA 136 139  K 3.7 3.1*  CL 102 105  CO2 24 25  GLUCOSE 129* 101*  BUN 5* 5*  CREATININE 0.42*  0.43*  CALCIUM 8.0* 7.9*     Liver Panel  No results for input(s): PROT, ALBUMIN, AST, ALT, ALKPHOS, BILITOT, BILIDIR, IBILI in the last 72 hours.     Sedimentation Rate No results for input(s): ESRSEDRATE in the last 72 hours. C-Reactive Protein No results for input(s): CRP in the last 72 hours.  Micro Results: Recent Results (from the past 720 hour(s))  SARS CORONAVIRUS 2 (TAT 6-24 HRS) Nasopharyngeal Nasopharyngeal Swab     Status: None   Collection Time: 11/19/19  6:01 AM   Specimen: Nasopharyngeal Swab  Result Value Ref Range Status   SARS Coronavirus 2 NEGATIVE NEGATIVE Final    Comment: (NOTE) SARS-CoV-2 target nucleic acids are NOT DETECTED. The  SARS-CoV-2 RNA is generally detectable in upper and lower respiratory specimens during the acute phase of infection. Negative results do not preclude SARS-CoV-2 infection, do not rule out co-infections with other pathogens, and should not be used as the sole basis for treatment or other patient management decisions. Negative results must be combined with clinical observations, patient history, and epidemiological information. The expected result is Negative. Fact Sheet for Patients: SugarRoll.be Fact Sheet for Healthcare Providers: https://www.woods-mathews.com/ This test is not yet approved or cleared by the [place] FDA and  has been authorized for detection and/or diagnosis of SARS-CoV-2 by FDA under an Emergency Use Authorization (EUA). This EUA will remain  in effect (meaning this test can be used) for the duration of the COVID-19 declaration under Section 56 4(b)(1) of the Act, 21 U.S.C. section 360bbb-3(b)(1), unless the authorization is terminated or revoked sooner. Performed at [facility], [place] [address] [place], [place] [address]   Culture, blood (routine x 2)     Status: None   Collection Time: 11/19/19  5:15 PM   Specimen: BLOOD RIGHT ARM  Result Value Ref Range Status   Specimen Description BLOOD RIGHT ARM  Final   Special Requests   Final    BOTTLES DRAWN AEROBIC AND ANAEROBIC Blood Culture results may not be optimal due to an inadequate volume of blood received in culture bottles   Culture   Final    NO GROWTH 5 DAYS Performed at [facility], [place] [address] [place], [place] [address]    Report Status 11/24/2019 FINAL  Final  Culture, blood (routine x 2)     Status: None   Collection Time: 11/19/19  5:20 PM   Specimen: BLOOD RIGHT HAND  Result Value Ref Range Status   Specimen Description BLOOD RIGHT HAND  Final   Special Requests   Final    AEROBIC BOTTLE ONLY Blood Culture results may not be optimal  due to an inadequate volume of blood received in culture bottles   Culture   Final    NO GROWTH 5 DAYS Performed at [facility], [place] [address], [place], [place] [address]    Report Status 11/24/2019 FINAL  Final    Studies/Results: MR BRAIN W WO CONTRAST  Result Date: 11/24/2019 CLINICAL DATA:  Initial evaluation for meningitis, CNS infection, headache. EXAM: MRI HEAD WITHOUT AND WITH CONTRAST TECHNIQUE: Multiplanar, multiecho pulse sequences of the brain and surrounding structures were obtained without and with intravenous contrast. CONTRAST:  53mL GADAVIST GADOBUTROL 1 MMOL/ML IV SOLN COMPARISON:  Prior head CT from 11/19/2019. FINDINGS: Brain: Cerebral volume within normal limits for age. There are a few scattered subcentimeter foci of FLAIR signal abnormality seen along several cortical sulci, most pronounced within the left frontal lobe (series  11, image 19, 18), and left parietal lobe (series 11, image 20). Subtle FLAIR signal intensity seen along several cortical sulci within the parietooccipital regions (series 11, image 13, 12). Following contrast administration, there is subtle leptomeningeal enhancement, most notable posteriorly. Constellation of findings most suggestive of meningitis, consistent with provided history. Note made of an additional 1 cm FLAIR lesion involving the right periatrial white matter (series 11, image 14), somewhat more indeterminate, but suspected to be related underlying infection/meningitis as well, as this lesion demonstrates subtle postcontrast enhancement (series 19, image 10). Small capillary telangiectasia or possibly demyelinating lesion would be the primary differential considerations. No other focal parenchymal signal abnormality. No abnormal foci of restricted diffusion to suggest acute or subacute ischemia. Gray-white matter differentiation maintained. No encephalomalacia to suggest chronic cortical infarction. Minimal susceptibility artifact noted  associated with the right periatrial lesion (series 14, image 29). No other evidence for acute or chronic intracranial hemorrhage. No mass lesion, midline shift or mass effect. No hydrocephalus. No visible intraventricular debris. Pituitary gland suprasellar region normal. Midline structures intact. Vascular: Major intracranial vascular flow voids are maintained. Skull and upper cervical spine: Craniocervical junction within normal limits. Upper cervical spine normal. Bone marrow signal intensity within normal limits. No scalp soft tissue abnormality. Sinuses/Orbits: Globes and orbital soft tissues demonstrate no acute finding. Paranasal sinuses are clear. No mastoid effusion. Inner ear structures grossly normal. Other: None. IMPRESSION: 1. Few scattered subcentimeter foci of FLAIR signal abnormality involving the juxtacortical white matter and cortical sulci of both cerebral hemispheres with associated subtle leptomeningeal enhancement. Constellation of findings suggestive of meningitis. 2. 1 cm FLAIR lesion involving the right periatrial white matter as above, somewhat more indeterminate, although suspected to be related to underlying infection/meningitis as well. Small capillary telangiectasia or demyelinating lesion would be the primary differential considerations. 3. Otherwise normal brain MRI. No other acute intracranial process identified. Electronically Signed   By: Rise Mu M.D.   On: 11/24/2019 04:36      Assessment/Plan:  INTERVAL HISTORY: MRI complete see report   Principal Problem:   Meningitis Active Problems:   Rheumatoid arthritis with rheumatoid factor of multiple sites without organ or systems involvement (HCC)   Syncope    Dawn [name] is a 39 y.o. female with rheumatoid arthritis who presented with what is an apparent aseptic meningitis.  She has been getting linezolid along with ceftriaxone for the possibility that she has a bacterial infection though cultures  were sterile from outside hospital.  MRI of the brain performed today show multiple areas of hyperintensity that radiology feels consistent with meningitis though they also raise ? Of hospital area of demyelination.  I think this patient does indeed have an aseptic meningitis but given the uncertainty and ? Could these scattered foci septic emboli  I would consider obtaining a transesophageal echocardiogram to exclude infectious endocarditis which could also cause abnormalities on MRI such as septic emboli  I have called Cardiology and they can perform this study on Wednesday.  It would be helpful for primary team to review MRI findings with Neurology and Neuroradiology.  Finally if this continues would consider LARGE volume LP with removal of 30 cc of CSF so that CSF can be sent for fungal antigens, cultures, AFB stain and culture as well as cytology  The presentation however seems to acute to be put into the chronic meningitis category but given she is receiving immunomodulatory therapy need to keep this in mind.    LOS: 5 days   Dawn [name]  Dawn [name] 11/24/2019, 2:21 PM

## 2019-11-24 NOTE — Plan of Care (Signed)
  Problem: Education: Goal: Knowledge of General Education information will improve Description Including pain rating scale, medication(s)/side effects and non-pharmacologic comfort measures Outcome: Progressing   

## 2019-11-24 NOTE — Consult Note (Signed)
Neurology Consultation  Reason for Consult: Headache with abnormal MRI  Referring Physician: Dr. Wynelle Cleveland  CC: Headache  History is obtained from: Patient and chart  HPI: Dawn [name] is a 39 y.o. female with medical history significant for rheumatoid arthritis on Orencia, recent admission with suspected meningitis, now presenting to the emergency department for evaluation of headache, nausea, loose stools and syncope.  Patient developed her headache on 11/08/2019, she was admitted to [facility] in which had a LP that was felt concerning for possible bacterial meningitis and treated with Decadron, ampicillin, Rocephin, and vancomycin.  Patient was discharged on 1/26 however returning home continue to have headaches and nausea along with loose stools.  Apparently upon returning home she also passed out and returned to the emergency department with ongoing complaints of headaches, nausea, vomiting and weakness.  Due to this patient did have an MRI today which showed a small number of approximately centimeter-sized FLAIR-hyperintense juxtacortical and cortical lesions. Neurology was consulted to further assess.  She currently rates her headache as 2/10. She has mild neck pain. She has been having floaters bilaterally for the past 2 weeks, since the headaches started. The headaches wax and wane and have been rated as high as an 8-10/10. She denies any motor weakness or sensory deficits and has not been confused. She has no history of MS as well as no family history of such.   From looking through her medication history while in the hospital, from a headache standpoint she has received: -Ketorolac -Reglan -Acetaminophen -Hydrocodone  In talking to the patient she states that the headache is not consistent.  There are times when she stands up and it is worse, throbbing but is alleviated when she lays down.  Other times it can occur when she is just laying down.  She does note that it seems to always occur  at 5 AM.  The headache is in the frontal region of her head and also down the sides of her neck at times.  She states that she is able to get up get to the bathroom without severe headache.  She is able to move her neck in all directions without any pain.  She states that she does have some black spots that are in her visual field-like a fog at times.  She denotes that she does not have any history of migraine headaches.  She has no other neurological symptoms.  She feels like Norco seems to be the only medication that brings the headache down but not for very long.   Past Medical History:  Diagnosis Date  . Arthritis   . H/O laparoscopy 05/05/13  . History of ectopic pregnancy 05/05/13  . Hx of unilateral salpingectomy 05/05/13   Left  . Hx of wisdom tooth extraction   . Medical history non-contributory      Family History  Problem Relation Age of Onset  . Psoriasis Sister    Social History:   reports that she quit smoking about 18 years ago. Her smoking use included cigarettes. She smoked 0.50 packs per day. She has never used smokeless tobacco. She reports that she does not drink alcohol or use drugs.  Medications  Current Facility-Administered Medications:  .  0.9 %  sodium chloride infusion, , Intravenous, Continuous, Georgette Shell, MD, Last Rate: 100 mL/hr at 11/24/19 1239, New Bag at 11/24/19 1239 .  acetaminophen (TYLENOL) tablet 650 mg, 650 mg, Oral, Q6H PRN, Opyd, Ilene Qua, MD, 650 mg at 11/24/19 1245 .  cefTRIAXone (  ROCEPHIN) 2 g in sodium chloride 0.9 % 100 mL IVPB, 2 g, Intravenous, Q12H, Opyd, Timothy S, MD, Last Rate: 200 mL/hr at 11/24/19 1455, 2 g at 11/24/19 1455 .  Chlorhexidine Gluconate Cloth 2 % PADS 6 each, 6 each, Topical, Daily, Georgette Shell, MD, 6 each at 11/23/19 [phone] .  feeding supplement (BOOST / RESOURCE BREEZE) liquid 1 Container, 1 Container, Oral, TID BM, Georgette Shell, MD, 1 Container at 11/20/19 1156 .  gadobutrol (GADAVIST) 1  MMOL/ML injection 7.5 mL, 7.5 mL, Intravenous, Once PRN, Adele Barthel D, MD .  heparin lock flush 100 unit/mL, 250 Units, Intracatheter, Prior to discharge, Adele Barthel D, MD .  HYDROcodone-acetaminophen (NORCO/VICODIN) 5-325 MG per tablet 1 tablet, 1 tablet, Oral, Q4H PRN, Blain Pais, MD, 1 tablet at 11/24/19 [phone] .  linezolid (ZYVOX) tablet 600 mg, 600 mg, Oral, Q12H, Susa Raring, RPH .  methylPREDNISolone sodium succinate (SOLU-MEDROL) 40 mg/mL injection 40 mg, 40 mg, Intravenous, Daily, Rizwan, Saima, MD, 40 mg at 11/24/19 1135 .  metoCLOPramide (REGLAN) injection 5 mg, 5 mg, Intravenous, Q8H, Georgette Shell, MD, 5 mg at 11/24/19 1451 .  multivitamin with minerals tablet 1 tablet, 1 tablet, Oral, Daily, Georgette Shell, MD, 1 tablet at 11/24/19 [phone] .  ondansetron (ZOFRAN) injection 4 mg, 4 mg, Intravenous, Q6H PRN, Opyd, Ilene Qua, MD, 4 mg at 11/24/19 1135 .  pantoprazole (PROTONIX) injection 40 mg, 40 mg, Intravenous, Q24H, Rizwan, Saima, MD, 40 mg at 11/24/19 1135 .  promethazine (PHENERGAN) injection 12.5 mg, 12.5 mg, Intravenous, Q6H PRN, Opyd, Ilene Qua, MD .  simethicone (MYLICON) chewable tablet 80 mg, 80 mg, Oral, QID PRN, Adele Barthel D, MD .  sodium chloride flush (NS) 0.9 % injection 10-40 mL, 10-40 mL, Intracatheter, PRN, Adele Barthel D, MD, 10 mL at 11/23/19 1803 .  sodium chloride flush (NS) 0.9 % injection 3 mL, 3 mL, Intravenous, Once, Opyd, Timothy S, MD .  sodium chloride flush (NS) 0.9 % injection 3 mL, 3 mL, Intravenous, Q12H, Opyd, Ilene Qua, MD, 3 mL at 11/23/19 2220 .  SUMAtriptan (IMITREX) injection 6 mg, 6 mg, Subcutaneous, Q2H PRN, Georgette Shell, MD  ROS:     General ROS: Positive for -headache Psychological ROS: negative for - behavioral disorder, hallucinations, memory difficulties, mood swings or suicidal ideation Ophthalmic ROS: negative for - blurry vision, double vision, eye pain or loss of  vision ENT ROS: negative for - epistaxis, nasal discharge, oral lesions, sore throat, tinnitus or vertigo Allergy and Immunology ROS: negative for - hives or itchy/watery eyes Hematological and Lymphatic ROS: negative for - bleeding problems, bruising or swollen lymph nodes Endocrine ROS: negative for - galactorrhea, hair pattern changes, polydipsia/polyuria or temperature intolerance Respiratory ROS: negative for - cough, hemoptysis, shortness of breath or wheezing Cardiovascular ROS: negative for - chest pain, dyspnea on exertion, edema or irregular heartbeat Gastrointestinal ROS: negative for - abdominal pain, diarrhea, hematemesis, nausea/vomiting or stool incontinence Genito-Urinary ROS: negative for - dysuria, hematuria, incontinence or urinary frequency/urgency Musculoskeletal ROS: negative for - joint swelling or muscular weakness Neurological ROS: as noted in HPI Dermatological ROS: negative for rash and skin lesion changes  Exam: Current vital signs: BP 130/66 (BP Location: Left Leg)   Pulse (!) 58   Temp 98.7 F (37.1 C) (Oral)   Resp 18   Ht _0  (1.676 m)   Wt 60.8 kg   SpO2 99%   BMI 21.63 kg/m  Vital signs in last 24 hours:  Temp:  [98.1 F (36.7 C)-98.7 F (37.1 C)] 98.7 F (37.1 C) (02/01 0922) Pulse Rate:  [54-63] 58 (02/01 0922) Resp:  [16-18] 18 (02/01 0922) BP: (130-143)/(61-70) 130/66 (02/01 0922) SpO2:  [99 %-100 %] 99 % (02/01 0922) Weight:  [60.8 kg] 60.8 kg (01/31 2138)   Constitutional: Appears well-developed and well-nourished.  Psych: Affect appropriate to situation Eyes: No scleral injection HENT: No OP obstrucion Head: Normocephalic.  Cardiovascular: Normal rate and regular rhythm.  Respiratory: Effort normal, non-labored breathing GI: Soft.  No distension. There is no tenderness.  Skin: WDI  Neuro: Mental Status: Patient is awake, alert, oriented to person, place, month, year, and situation. Speech- naming, repeating,  comprehension Patient is able to give a clear and coherent history. Cranial Nerves: II: Visual Fields are full.  III,IV, VI: EOMI without ptosis or diploplia. Pupils equal, round and reactive to light V: Facial sensation is symmetric to temperature VII: Facial movement is symmetric.  VIII: hearing is intact to voice X: Palat elevates symmetrically XI: Shoulder shrug is symmetric. XII: tongue is midline without atrophy or fasciculations.  Motor: Tone is normal. Bulk is normal. 5/5 strength was present in all four extremities.  Sensory: Sensation is symmetric to light touch and temperature in the arms and legs. Deep Tendon Reflexes: 2+ and symmetric in the biceps and patellae.  Plantars: Toes are downgoing bilaterally.  Cerebellar: FNF and HKS are intact bilaterally  Labs I have reviewed labs in epic and the results pertinent to this consultation are:   CBC    Component Value Date/Time   WBC 6.7 11/24/2019 0411   RBC 3.48 (L) 11/24/2019 0411   HGB 9.6 (L) 11/24/2019 0411   HGB 12.3 09/16/2019 1057   HCT 29.4 (L) 11/24/2019 0411   HCT 39.6 09/16/2019 1057   PLT 265 11/24/2019 0411   PLT 242 09/16/2019 1057   MCV 84.5 11/24/2019 0411   MCV 90 09/16/2019 1057   MCH 27.6 11/24/2019 0411   MCHC 32.7 11/24/2019 0411   RDW 13.3 11/24/2019 0411   RDW 13.7 09/16/2019 1057   LYMPHSABS 2.0 11/24/2019 0411   LYMPHSABS 1.2 09/16/2019 1057   MONOABS 0.6 11/24/2019 0411   EOSABS 0.0 11/24/2019 0411   EOSABS 0.0 09/16/2019 1057   BASOSABS 0.0 11/24/2019 0411   BASOSABS 0.0 09/16/2019 1057    CMP     Component Value Date/Time   NA 139 11/24/2019 0411   NA 140 09/16/2019 1057   K 3.1 (L) 11/24/2019 0411   CL 105 11/24/2019 0411   CO2 25 11/24/2019 0411   GLUCOSE 101 (H) 11/24/2019 0411   BUN 5 (L) 11/24/2019 0411   BUN 15 09/16/2019 1057   CREATININE 0.43 (L) 11/24/2019 0411   CREATININE 0.64 08/26/2018 1057   CALCIUM 7.9 (L) 11/24/2019 0411   PROT 6.2 (L) 11/18/2019  2214   PROT 7.0 09/16/2019 1057   ALBUMIN 3.2 (L) 11/18/2019 2214   ALBUMIN 4.7 09/16/2019 1057   AST 14 (L) 11/18/2019 2214   ALT 11 11/18/2019 2214   ALKPHOS 69 11/18/2019 2214   BILITOT 0.6 11/18/2019 2214   BILITOT 0.5 09/16/2019 1057   GFRNONAA >60 11/24/2019 0411   GFRNONAA 113 08/26/2018 1057   GFRAA >60 11/24/2019 0411   GFRAA 131 08/26/2018 1057    Imaging I have reviewed the images obtained:  CT-scan of the brain-11/19/2019-Normal noncontrast CT appearance of the brain. No evidence of acute intracranial abnormality.  MRI examination of the brain- IMPRESSION: 1. Few scattered subcentimeter foci  of FLAIR signal abnormality involving the juxtacortical white matter and cortical sulci of both cerebral hemispheres with associated subtle leptomeningeal enhancement. Constellation of findings suggestive of meningitis. 2. 1 cm FLAIR lesion involving the right periatrial white matter as above, somewhat more indeterminate, although suspected to be related to underlying infection/meningitis as well. Small capillary telangiectasia or demyelinating lesion would be the primary differential considerations. 3. Otherwise normal brain MRI. No other acute intracranial process identified.   Etta Quill PA-C [facility] [phone] 11/24/2019, 5:04 PM     Assessment:  39 year old female presenting to the hospital while being treated with IV ABX at home for possible bacterial meningitis diagnosed at outlying facility.  Patient now returns with fluctuating severe headache, as described above.   1. MRI was obtained which did show a few foci of FLAIR abnormality of uncertain age. Radiology report documents overlying subtle meningeal enhancement, but on review of images by Neurology team, this latter finding appears more likely to be incidental.  2. DDx for the MRI lesions is broad, including MS, small vessel CNS vasculitis (including Susac syndrome, CNS lupus vasculitis, primary  CNS angitis), cardioembolic strokes and neurosarcoidosis. She is at increased risk for a vasculitis given her history of rheumatoid arthritis. Although she may be more prone to infection due to treatment with Orencia, the lesions on her MRI are unusual for an infection - however, SBE should be considered.  3. In discussing with patient, the description of the headaches does not fit perfectly with post LP headache. Migraine also unlikely given new-onset and essentially unremitting waxing/waning course of the headaches for the past 2 weeks.  Recommendations: 1. Consider obtaining a 4-vessel diagnostic angiogram with Dr. Estanislado Pandy for assessment of possible CNS vasculitis.  2. CNS vasculitis work up to include lupus panel, ESR and C-reactive protein as well as lab assessment for possible hepatitis C. For possible ANCA associated CNS vasculitis ANCA immunoassays (MPO- and PR3- ANCA testing) can be obtained.   3. May need repeat LP. If LP is obtained, labs should include a VZV PCR and repeat bacterial and fungal cultures on CSF. Also would obtain a CSF cryptococcal antigen level, IgG index and oligoclonal bands.   4. Agree with obtaining TEE.  5. Will need outpatient Neurology follow up.  6. Obtain labs from [facility] admission, including prior LP results.   I have seen and examined the patient. I have formulated the assessment and recommendations. 39 year old female with waxing and waning headaches for 2 weeks in conjunction with systemic symptoms. Diagnosed at OSH with possible bacterial meningitis, which was being treated at home when she represented to [facility] with recurrent headache, weakness and an episode of syncope. MRI today showed a small number of approximately centimeter-sized FLAIR-hyperintense juxtacortical and cortical lesions.  Exam is nonfocal. DDx includes CNS vasculitis. Recommendations as above.   Electronically signed: Dr. Kerney Elbe

## 2019-11-24 NOTE — Telephone Encounter (Signed)
I called to check on the patient.  I spoke with Brad.  He mentioned that patient had MRI this morning and is a scheduled for TEE.  She was seen by Dr.Van Dam today.  I answered most questions which I could by reviewing the chart.

## 2019-11-24 NOTE — Progress Notes (Signed)
Sister, Galen Manila, called and stated that a doctor had left the pt's room and told pt that the plan was for a test that involved "sticking a camera down her throat to see her heart." This RN checked the notes and orders but there are currently no orders or a note regarding this test. The pt stated that MD Charlcie Cradle was the MD that told her about this test. Per Galen Manila she wants the husband at the bedside before this test is performed and was asking if a team had assessed the puncture sites of the lumbar punctures that were performed in [place]. This information was passed to MD Rizwan and contacted infectious disease pager to share this information with them also.    Leonia Reeves, RN, BSN

## 2019-11-25 ENCOUNTER — Other Ambulatory Visit (HOSPITAL_COMMUNITY): Payer: BC Managed Care – PPO

## 2019-11-25 ENCOUNTER — Encounter (HOSPITAL_COMMUNITY): Payer: Self-pay | Admitting: Internal Medicine

## 2019-11-25 MED ORDER — GENERIC EXTERNAL MEDICATION
Status: DC
Start: ? — End: 2019-11-25

## 2019-11-25 MED ORDER — METHYLPREDNISOLONE SODIUM SUCC 40 MG IJ SOLR: 40.0000 mg | Freq: Every day | INTRAMUSCULAR | Status: DC

## 2019-11-25 MED ORDER — SODIUM CHLORIDE 0.9 % IV SOLN
1000.0000 mg | INTRAVENOUS | Status: AC
  Administered 2019-11-25 – 2019-11-29 (×5): 1000 mg via INTRAVENOUS
  Filled 2019-11-25 (×6): qty 8

## 2019-11-25 NOTE — Progress Notes (Signed)
Subjective: Feels a little bit better today but not much change in headache   Antibiotics:  Anti-infectives (From admission, onward)   Start     Dose/Rate Route Frequency Ordered Stop   11/24/19 2000  linezolid (ZYVOX) tablet 600 mg     600 mg Oral Every 12 hours 11/24/19 1109     11/21/19 0000  cefTRIAXone (ROCEPHIN) IVPB  Status:  Discontinued     2 g Intravenous Every 12 hours 11/21/19 1839 11/25/19    11/19/19 2000  linezolid (ZYVOX) IVPB 600 mg  Status:  Discontinued     600 mg 300 mL/hr over 60 Minutes Intravenous Every 12 hours 11/19/19 1556 11/24/19 1109   11/19/19 1600  vancomycin (VANCOREADY) IVPB 1250 mg/250 mL  Status:  Discontinued     1,250 mg 166.7 mL/hr over 90 Minutes Intravenous Every 8 hours 11/19/19 0548 11/19/19 1556   11/19/19 1400  cefTRIAXone (ROCEPHIN) 2 g in sodium chloride 0.9 % 100 mL IVPB  Status:  Discontinued     2 g 200 mL/hr over 30 Minutes Intravenous Every 12 hours 11/19/19 0530 11/25/19 1042   11/19/19 0230  linezolid (ZYVOX) IVPB 600 mg     600 mg 300 mL/hr over 60 Minutes Intravenous  Once 11/19/19 0201 11/19/19 0552   11/19/19 0215  cefTRIAXone (ROCEPHIN) 2 g in sodium chloride 0.9 % 100 mL IVPB     2 g 200 mL/hr over 30 Minutes Intravenous  Once 11/19/19 0201 11/19/19 0305      Medications: Scheduled Meds: . Chlorhexidine Gluconate Cloth  6 each Topical Daily  . feeding supplement  1 Container Oral TID BM  . linezolid  600 mg Oral Q12H  . [START ON 11/30/2019] methylPREDNISolone (SOLU-MEDROL) injection  40 mg Intravenous Daily  . metoCLOPramide (REGLAN) injection  5 mg Intravenous Q8H  . multivitamin with minerals  1 tablet Oral Daily  . pantoprazole (PROTONIX) IV  40 mg Intravenous Q24H  . sodium chloride flush  3 mL Intravenous Once  . sodium chloride flush  3 mL Intravenous Q12H   Continuous Infusions: . sodium chloride 100 mL/hr at 11/24/19 2338  . methylPREDNISolone (SOLU-MEDROL) injection 1,000 mg (11/25/19 1248)    PRN Meds:.acetaminophen, gadobutrol, heparin lock flush, HYDROcodone-acetaminophen, ondansetron (ZOFRAN) IV, promethazine, simethicone, sodium chloride flush, SUMAtriptan    Objective: Weight change: 1 kg  Intake/Output Summary (Last 24 hours) at 11/25/2019 1446 Last data filed at 11/25/2019 1443 Gross per 24 hour  Intake 2250.83 ml  Output 200 ml  Net 2050.83 ml   Blood pressure (!) 136/56, pulse 97, temperature 100.1 F (37.8 C), temperature source Oral, resp. rate 16, height 5\' 6"  (1.676 m), weight 61.8 kg, SpO2 98 %, unknown if currently breastfeeding. Temp:  [98 F (36.7 C)-102.9 F (39.4 C)] 100.1 F (37.8 C) (02/02 1419) Pulse Rate:  [66-97] 97 (02/02 1000) Resp:  [16-21] 16 (02/02 1056) BP: (99-142)/(50-68) 136/56 (02/02 1000) SpO2:  [95 %-100 %] 98 % (02/02 1000) Weight:  [61.8 kg] 61.8 kg (02/01 2120)  Physical Exam: General: Alert and awake, oriented x3, not in any acute distress. HEENT: anicteric sclera, EOMI CVS regular rate, normal  Chest: , no wheezing, no respiratory distress Abdomen: soft non-distended,  Extremities: no edema or deformity noted bilaterally Skin: no rashes Neuro: nonfocal  CBC:    BMET Recent Labs    11/23/19 0340 11/24/19 0411  NA 136 139  K 3.7 3.1*  CL 102 105  CO2 24 25  GLUCOSE 129*  101*  BUN 5* 5*  CREATININE 0.42* 0.43*  CALCIUM 8.0* 7.9*     Liver Panel  No results for input(s): PROT, ALBUMIN, AST, ALT, ALKPHOS, BILITOT, BILIDIR, IBILI in the last 72 hours.     Sedimentation Rate No results for input(s): ESRSEDRATE in the last 72 hours. C-Reactive Protein No results for input(s): CRP in the last 72 hours.  Micro Results: Recent Results (from the past 720 hour(s))  SARS CORONAVIRUS 2 (TAT 6-24 HRS) Nasopharyngeal Nasopharyngeal Swab     Status: None   Collection Time: 11/19/19  6:01 AM   Specimen: Nasopharyngeal Swab  Result Value Ref Range Status   SARS Coronavirus 2 NEGATIVE NEGATIVE Final    Comment:  (NOTE) SARS-CoV-2 target nucleic acids are NOT DETECTED. The SARS-CoV-2 RNA is generally detectable in upper and lower respiratory specimens during the acute phase of infection. Negative results do not preclude SARS-CoV-2 infection, do not rule out co-infections with other pathogens, and should not be used as the sole basis for treatment or other patient management decisions. Negative results must be combined with clinical observations, patient history, and epidemiological information. The expected result is Negative. Fact Sheet for Patients: HairSlick.no Fact Sheet for Healthcare Providers: quierodirigir.com This test is not yet approved or cleared by the [place] FDA and  has been authorized for detection and/or diagnosis of SARS-CoV-2 by FDA under an Emergency Use Authorization (EUA). This EUA will remain  in effect (meaning this test can be used) for the duration of the COVID-19 declaration under Section 56 4(b)(1) of the Act, 21 U.S.C. section 360bbb-3(b)(1), unless the authorization is terminated or revoked sooner. Performed at [facility], [address], [place], [place] [address]   Culture, blood (routine x 2)     Status: None   Collection Time: 11/19/19  5:15 PM   Specimen: BLOOD RIGHT ARM  Result Value Ref Range Status   Specimen Description BLOOD RIGHT ARM  Final   Special Requests   Final    BOTTLES DRAWN AEROBIC AND ANAEROBIC Blood Culture results may not be optimal due to an inadequate volume of blood received in culture bottles   Culture   Final    NO GROWTH 5 DAYS Performed at [facility], [address], [place], [place] [address]    Report Status 11/24/2019 FINAL  Final  Culture, blood (routine x 2)     Status: None   Collection Time: 11/19/19  5:20 PM   Specimen: BLOOD RIGHT HAND  Result Value Ref Range Status   Specimen Description BLOOD RIGHT HAND  Final   Special Requests   Final     AEROBIC BOTTLE ONLY Blood Culture results may not be optimal due to an inadequate volume of blood received in culture bottles   Culture   Final    NO GROWTH 5 DAYS Performed at [facility], [address], [place], [place] [address]    Report Status 11/24/2019 FINAL  Final    Studies/Results: MR BRAIN W WO CONTRAST  Result Date: 11/24/2019 CLINICAL DATA:  Initial evaluation for meningitis, CNS infection, headache. EXAM: MRI HEAD WITHOUT AND WITH CONTRAST TECHNIQUE: Multiplanar, multiecho pulse sequences of the brain and surrounding structures were obtained without and with intravenous contrast. CONTRAST:  40mL GADAVIST GADOBUTROL 1 MMOL/ML IV SOLN COMPARISON:  Prior head CT from 11/19/2019. FINDINGS: Brain: Cerebral volume within normal limits for age. There are a few scattered subcentimeter foci of FLAIR signal abnormality seen along several cortical sulci,  most pronounced within the left frontal lobe (series 11, image 19, 18), and left parietal lobe (series 11, image 20). Subtle FLAIR signal intensity seen along several cortical sulci within the parietooccipital regions (series 11, image 13, 12). Following contrast administration, there is subtle leptomeningeal enhancement, most notable posteriorly. Constellation of findings most suggestive of meningitis, consistent with provided history. Note made of an additional 1 cm FLAIR lesion involving the right periatrial white matter (series 11, image 14), somewhat more indeterminate, but suspected to be related underlying infection/meningitis as well, as this lesion demonstrates subtle postcontrast enhancement (series 19, image 10). Small capillary telangiectasia or possibly demyelinating lesion would be the primary differential considerations. No other focal parenchymal signal abnormality. No abnormal foci of restricted diffusion to suggest acute or subacute ischemia. Gray-white matter differentiation maintained. No encephalomalacia to suggest chronic  cortical infarction. Minimal susceptibility artifact noted associated with the right periatrial lesion (series 14, image 29). No other evidence for acute or chronic intracranial hemorrhage. No mass lesion, midline shift or mass effect. No hydrocephalus. No visible intraventricular debris. Pituitary gland suprasellar region normal. Midline structures intact. Vascular: Major intracranial vascular flow voids are maintained. Skull and upper cervical spine: Craniocervical junction within normal limits. Upper cervical spine normal. Bone marrow signal intensity within normal limits. No scalp soft tissue abnormality. Sinuses/Orbits: Globes and orbital soft tissues demonstrate no acute finding. Paranasal sinuses are clear. No mastoid effusion. Inner ear structures grossly normal. Other: None. IMPRESSION: 1. Few scattered subcentimeter foci of FLAIR signal abnormality involving the juxtacortical white matter and cortical sulci of both cerebral hemispheres with associated subtle leptomeningeal enhancement. Constellation of findings suggestive of meningitis. 2. 1 cm FLAIR lesion involving the right periatrial white matter as above, somewhat more indeterminate, although suspected to be related to underlying infection/meningitis as well. Small capillary telangiectasia or demyelinating lesion would be the primary differential considerations. 3. Otherwise normal brain MRI. No other acute intracranial process identified. Electronically Signed   By: Rise Mu M.D.   On: 11/24/2019 04:36      Assessment/Plan:  INTERVAL HISTORY: Neurology formally seen the patient initiated autoimmune work-up and and he patient will get a CT to look at the vasculature in the CNS possibly another lumbar puncture   Principal Problem:   Meningitis Active Problems:   Rheumatoid arthritis with rheumatoid factor of multiple sites without organ or systems involvement (HCC)   Syncope    Dawn [name] is a 39 y.o. female with  rheumatoid arthritis who presented with what is an apparent aseptic meningitis.  She has been getting linezolid along with ceftriaxone for the possibility that she has a bacterial infection though cultures were sterile from outside hospital.  MRI of the brain showed  multiple areas of hyperintensity that radiology feels consistent with meningitis though they also raise ? Of hospital area of demyelination.  She has not been formally seen by neurology who are initiating autoimmune work-up.  Patient now is had the transesophageal echocardiogram but results are still pending.  I am going to discontinue her ceftriaxone and keep her on Zyvox alone for now  As mentioned before if diagnosis still unclear and there is concern for an infectious etiology of her aseptic meningitis she does have a large volume lumbar puncture with 30 mL at least of CSF taken and CSF sent for cell count differential glucose protein, cryptococcal antigen, histoplasma antigen Blastomyces antigen fungal cultures AFB cultures  VZV, HSV PCRs also reasonable      LOS: 6 days   Paulette Blanch  Dam 11/25/2019, 2:46 PM

## 2019-11-25 NOTE — Progress Notes (Signed)
11/25/2019 1:18 PM  Patient sister Idell Pickles called to voice concerns regarding care.   Spoke to patient and husband, [name], at bedside regarding care concerns and assisted in service recovery. Concerns were mentioned about the delay in receiving documentation of medical records from previous admission at [facility] in [place]. Per further chart review, [facility] requested medical records on 11/19/19 1730 and information was received via fax 11/20/19 0836 - hard copies placed in patient shadow chart at [employer]. Additional concerns regarding Nursing care and Physician awareness. Informed [name] that he has been granted permission to continue to stay over night with patient while on [facility]M. Brad verbalized understanding.   Will continue to round on both patient and family member while on [facility]M.   Chevon Laufer MSN, RN-BC, CNML [facility] Phone: [phone]

## 2019-11-25 NOTE — Anesthesia Preprocedure Evaluation (Addendum)
Anesthesia Evaluation  Patient identified by MRN, date of birth, ID band Patient awake    Reviewed: Allergy & Precautions, NPO status , Patient's Chart, lab work & pertinent test results  History of Anesthesia Complications Negative for: history of anesthetic complications  Airway Mallampati: I  TM Distance: >3 FB Neck ROM: Full    Dental   Pulmonary former smoker,    Pulmonary exam normal        Cardiovascular negative cardio ROS Normal cardiovascular exam     Neuro/Psych negative neurological ROS  negative psych ROS   GI/Hepatic negative GI ROS, Neg liver ROS,   Endo/Other  negative endocrine ROS  Renal/GU negative Renal ROS  negative genitourinary   Musculoskeletal  (+) Arthritis , Rheumatoid disorders,    Abdominal   Peds  Hematology negative hematology ROS (+)   Anesthesia Other Findings Recent admission for suspected meningitis 11/07/18 discharged on IV abx, now with multiple episodes of syncope, nausea, diarrhea, bactermia  Reproductive/Obstetrics negative OB ROS                            Anesthesia Physical Anesthesia Plan  ASA: III  Anesthesia Plan: MAC   Post-op Pain Management:    Induction:   PONV Risk Score and Plan: 2 and Treatment may vary due to age or medical condition and Propofol infusion  Airway Management Planned: Nasal Cannula  Additional Equipment:   Intra-op Plan:   Post-operative Plan:   Informed Consent: I have reviewed the patients History and Physical, chart, labs and discussed the procedure including the risks, benefits and alternatives for the proposed anesthesia with the patient or authorized representative who has indicated his/her understanding and acceptance.       Plan Discussed with: CRNA and Surgeon  Anesthesia Plan Comments:        Anesthesia Quick Evaluation

## 2019-11-25 NOTE — Progress Notes (Signed)
Subjective: Patient currently having a severe headache 8/10.  No nausea or vomiting since yesterday.  Objective: Current vital signs: BP (!) 117/54 (BP Location: Right Arm)   Pulse 76   Temp 99.5 F (37.5 C) (Oral)   Resp 20   Ht '5\' 6"'  (1.676 m)   Wt 61.8 kg   SpO2 97%   BMI 21.99 kg/m  Vital signs in last 24 hours: Temp:  [98 F (36.7 C)-99.5 F (37.5 C)] 99.5 F (37.5 C) (02/02 0534) Pulse Rate:  [58-94] 76 (02/02 0534) Resp:  [18-20] 20 (02/02 0534) BP: (99-142)/(50-68) 117/54 (02/02 0534) SpO2:  [95 %-100 %] 97 % (02/02 0534) Weight:  [61.8 kg] 61.8 kg (02/01 2120)  Intake/Output from previous day: 02/01 0701 - 02/02 0700 In: 2130.8 [P.O.:240; I.V.:1591.2; IV Piggyback:299.6] Out: 300 [Emesis/NG output:300] Intake/Output this shift: No intake/output data recorded. Nutritional status:  Diet Order            Diet NPO time specified  Diet effective midnight        Diet general        Diet - low sodium heart healthy             Constitutional: Appears well-developed and well-nourished.  Psych: Affect appropriate to situation Eyes: No scleral injection HENT: No OP obstrucion Head: Normocephalic.  Cardiovascular: Normal rate and regular rhythm.  Respiratory: Effort normal, non-labored breathing GI: Soft. No distension. There is no tenderness.  Skin: WDI  Neuro: Mental Status: Patient is awake, alert, oriented to person, place, month, year, and situation. Speech- naming, repeating, comprehension Patient is able to give a clear and coherent history. Cranial Nerves: II: Visual Fields are full.  III,IV, VI: EOMI without ptosis or diploplia. Pupils equal, round and reactive to light V: Facial sensation is symmetric to temperature VII: Facial movement is symmetric.  VIII: hearing is intact to voice X: Palat elevates symmetrically XI: Shoulder shrug is symmetric. XII: tongue is midline without atrophy or fasciculations.  Motor: Tone is normal. Bulk is  normal. 5/5 strength was present in all four extremities.  Sensory: Sensation is symmetric to light touch and temperature in the arms and legs. Deep Tendon Reflexes: 2+ and symmetric in the biceps and patellae.  Plantars: Toes are downgoing bilaterally.  Cerebellar: FNF and HKS are intact bilaterally    Lab Results: Results for orders placed or performed during the hospital encounter of 11/18/19 (from the past 48 hour(s))  Basic metabolic panel     Status: Abnormal   Collection Time: 11/24/19  4:11 AM  Result Value Ref Range   Sodium 139 135 - 145 mmol/L   Potassium 3.1 (L) 3.5 - 5.1 mmol/L   Chloride 105 98 - 111 mmol/L   CO2 25 22 - 32 mmol/L   Glucose, Bld 101 (H) 70 - 99 mg/dL   BUN 5 (L) 6 - 20 mg/dL   Creatinine, Ser 0.43 (L) 0.44 - 1.00 mg/dL   Calcium 7.9 (L) 8.9 - 10.3 mg/dL   GFR calc non Af Amer >60 >60 mL/min   GFR calc Af Amer >60 >60 mL/min   Anion gap 9 5 - 15    Comment: Performed at [facility], [address], [place], [place] [address]  CBC with Differential/Platelet     Status: Abnormal   Collection Time: 11/24/19  4:11 AM  Result Value Ref Range   WBC 6.7 4.0 - 10.5 K/uL   RBC 3.48 (L) 3.87 - 5.11 MIL/uL   Hemoglobin 9.6 (  L) 12.0 - 15.0 g/dL   HCT 29.4 (L) 36.0 - 46.0 %   MCV 84.5 80.0 - 100.0 fL   MCH 27.6 26.0 - 34.0 pg   MCHC 32.7 30.0 - 36.0 g/dL   RDW 13.3 11.5 - 15.5 %   Platelets 265 150 - 400 K/uL   nRBC 0.0 0.0 - 0.2 %   Neutrophils Relative % 61 %   Neutro Abs 4.1 1.7 - 7.7 K/uL   Lymphocytes Relative 30 %   Lymphs Abs 2.0 0.7 - 4.0 K/uL   Monocytes Relative 9 %   Monocytes Absolute 0.6 0.1 - 1.0 K/uL   Eosinophils Relative 0 %   Eosinophils Absolute 0.0 0.0 - 0.5 K/uL   Basophils Relative 0 %   Basophils Absolute 0.0 0.0 - 0.1 K/uL   Immature Granulocytes 0 %   Abs Immature Granulocytes 0.03 0.00 - 0.07 K/uL    Comment: Performed at [facility] [address] [place], [place] [address]    Recent Results (from  the past 240 hour(s))  SARS CORONAVIRUS 2 (TAT 6-24 HRS) Nasopharyngeal Nasopharyngeal Swab     Status: None   Collection Time: 11/19/19  6:01 AM   Specimen: Nasopharyngeal Swab  Result Value Ref Range Status   SARS Coronavirus 2 NEGATIVE NEGATIVE Final    Comment: (NOTE) SARS-CoV-2 target nucleic acids are NOT DETECTED. The SARS-CoV-2 RNA is generally detectable in upper and lower respiratory specimens during the acute phase of infection. Negative results do not preclude SARS-CoV-2 infection, do not rule out co-infections with other pathogens, and should not be used as the sole basis for treatment or other patient management decisions. Negative results must be combined with clinical observations, patient history, and epidemiological information. The expected result is Negative. Fact Sheet for Patients: SugarRoll.be Fact Sheet for Healthcare Providers: https://www.woods-mathews.com/ This test is not yet approved or cleared by the [place] FDA and  has been authorized for detection and/or diagnosis of SARS-CoV-2 by FDA under an Emergency Use Authorization (EUA). This EUA will remain  in effect (meaning this test can be used) for the duration of the COVID-19 declaration under Section 56 4(b)(1) of the Act, 21 U.S.C. section 360bbb-3(b)(1), unless the authorization is terminated or revoked sooner. Performed at [facility], [place] [address], [place], [place] [address]   Culture, blood (routine x 2)     Status: None   Collection Time: 11/19/19  5:15 PM   Specimen: BLOOD RIGHT ARM  Result Value Ref Range Status   Specimen Description BLOOD RIGHT ARM  Final   Special Requests   Final    BOTTLES DRAWN AEROBIC AND ANAEROBIC Blood Culture results may not be optimal due to an inadequate volume of blood received in culture bottles   Culture   Final    NO GROWTH 5 DAYS Performed at [facility], [place] [address] [place], [place]  [address]    Report Status 11/24/2019 FINAL  Final  Culture, blood (routine x 2)     Status: None   Collection Time: 11/19/19  5:20 PM   Specimen: BLOOD RIGHT HAND  Result Value Ref Range Status   Specimen Description BLOOD RIGHT HAND  Final   Special Requests   Final    AEROBIC BOTTLE ONLY Blood Culture results may not be optimal due to an inadequate volume of blood received in culture bottles   Culture   Final    NO GROWTH 5 DAYS Performed at [facility]  Lab, [address] [place], [place] [address]    Report Status 11/24/2019 FINAL  Final    Lipid Panel No results for input(s): CHOL, TRIG, HDL, CHOLHDL, VLDL, LDLCALC in the last 72 hours.  Studies/Results: MR BRAIN W WO CONTRAST  Result Date: 11/24/2019 CLINICAL DATA:  Initial evaluation for meningitis, CNS infection, headache. EXAM: MRI HEAD WITHOUT AND WITH CONTRAST TECHNIQUE: Multiplanar, multiecho pulse sequences of the brain and surrounding structures were obtained without and with intravenous contrast. CONTRAST:  71m GADAVIST GADOBUTROL 1 MMOL/ML IV SOLN COMPARISON:  Prior head CT from 11/19/2019. FINDINGS: Brain: Cerebral volume within normal limits for age. There are a few scattered subcentimeter foci of FLAIR signal abnormality seen along several cortical sulci, most pronounced within the left frontal lobe (series 11, image 19, 18), and left parietal lobe (series 11, image 20). Subtle FLAIR signal intensity seen along several cortical sulci within the parietooccipital regions (series 11, image 13, 12). Following contrast administration, there is subtle leptomeningeal enhancement, most notable posteriorly. Constellation of findings most suggestive of meningitis, consistent with provided history. Note made of an additional 1 cm FLAIR lesion involving the right periatrial white matter (series 11, image 14), somewhat more indeterminate, but suspected to be related underlying infection/meningitis as well, as this lesion demonstrates  subtle postcontrast enhancement (series 19, image 10). Small capillary telangiectasia or possibly demyelinating lesion would be the primary differential considerations. No other focal parenchymal signal abnormality. No abnormal foci of restricted diffusion to suggest acute or subacute ischemia. Gray-white matter differentiation maintained. No encephalomalacia to suggest chronic cortical infarction. Minimal susceptibility artifact noted associated with the right periatrial lesion (series 14, image 29). No other evidence for acute or chronic intracranial hemorrhage. No mass lesion, midline shift or mass effect. No hydrocephalus. No visible intraventricular debris. Pituitary gland suprasellar region normal. Midline structures intact. Vascular: Major intracranial vascular flow voids are maintained. Skull and upper cervical spine: Craniocervical junction within normal limits. Upper cervical spine normal. Bone marrow signal intensity within normal limits. No scalp soft tissue abnormality. Sinuses/Orbits: Globes and orbital soft tissues demonstrate no acute finding. Paranasal sinuses are clear. No mastoid effusion. Inner ear structures grossly normal. Other: None. IMPRESSION: 1. Few scattered subcentimeter foci of FLAIR signal abnormality involving the juxtacortical white matter and cortical sulci of both cerebral hemispheres with associated subtle leptomeningeal enhancement. Constellation of findings suggestive of meningitis. 2. 1 cm FLAIR lesion involving the right periatrial white matter as above, somewhat more indeterminate, although suspected to be related to underlying infection/meningitis as well. Small capillary telangiectasia or demyelinating lesion would be the primary differential considerations. 3. Otherwise normal brain MRI. No other acute intracranial process identified. Electronically Signed   By: BJeannine BogaM.D.   On: 11/24/2019 04:36    Medications:  Scheduled: . Chlorhexidine Gluconate  Cloth  6 each Topical Daily  . feeding supplement  1 Container Oral TID BM  . linezolid  600 mg Oral Q12H  . methylPREDNISolone (SOLU-MEDROL) injection  40 mg Intravenous Daily  . metoCLOPramide (REGLAN) injection  5 mg Intravenous Q8H  . multivitamin with minerals  1 tablet Oral Daily  . pantoprazole (PROTONIX) IV  40 mg Intravenous Q24H  . sodium chloride flush  3 mL Intravenous Once  . sodium chloride flush  3 mL Intravenous Q12H   Continuous: . sodium chloride 100 mL/hr at 11/24/19 2338  . cefTRIAXone (ROCEPHIN)  IV 2 g (11/25/19 0243)    Pertinent labs [facility]:    CSF cytology: Cerebrospinal  Fluid (Cytospin): Abundant lymphocytes, plasmacytoid lymphocytes, monocytes, neutrophils, and eosinophils.  CSF flow cytometry: Cerebrospinal fluid, flow cytometric immunophenotyping: - No immunophenotypic abnormalities identified. - See comment.  CSF bacterial culture: No growth at 5 days. Moderate WBC seen. No organisms seen CSF    CSF cryptococcal Ag: negative      HIV test: Non-reactive  Peripheral blood culture: No growth at 5 days     Assessment:  39 year old female presenting to the hospital while being treated with IV ABX at home for possible bacterial meningitis diagnosed at outlying facility.  Patient now returns with fluctuating severe headache, as described above.   1. MRI was obtained which did show a few foci of FLAIR abnormality of uncertain age. Radiology report documents overlying subtle meningeal enhancement, but on review of images by Neurology team, this latter finding appears more likely to be incidental.  2. DDx for the MRI lesions is broad, including MS, small vessel CNS vasculitis (including Susac syndrome, CNS lupus vasculitis, primary CNS angitis), cardioembolic strokes and neurosarcoidosis. She is at increased risk for a vasculitis given her history of rheumatoid arthritis. Although she may be more prone to infection due  to treatment with Orencia, the lesions on her MRI are unusual for an infection - however, SBE should be considered.  3. In discussing with patient, the description of the headaches does not fit perfectly with post LP headache. Migraine also unlikely given new-onset and essentially unremitting waxing/waning course of the headaches for the past 2 weeks.  Recommendations: 1. Consider obtaining a 4-vessel diagnostic angiogram with Dr. Estanislado Pandy for assessment of possible CNS vasculitis.  2. CNS vasculitis work up to include lupus panel, ESR and C-reactive protein as well as lab assessment for possible hepatitis C. For possible ANCA associated CNS vasculitis ANCA immunoassays (MPO- and PR3- ANCA testing) can be obtained.   3.  At this point I do not believe repeat LP  is needed as all previous documents from [facility] have been obtained 4. Agree with obtaining TEE.  5. Obtain repeat urinalysis to compare to previous urinalysis done at [facility] 6.  Start IV pulsed-dose Solu-Medrol for 5 days at 1000 mg/day 7.  Would obtain nephrology consult to assess for possible vasculitis affecting her kidneys given abnormal urinalysis results at [facility].  8. Will need outpatient Neurology follow up.      LOS: 6 days   '@Electronically'  signed: Dr. Kerney Elbe 11/25/2019  9:13 AM

## 2019-11-25 NOTE — Plan of Care (Signed)
  Problem: Clinical Measurements: Goal: Diagnostic test results will improve Outcome: Progressing   

## 2019-11-25 NOTE — Progress Notes (Signed)
Applies ice pack to patients back for reduce fever per MD orders. Will continue to monitor patient.   Lillia Pauls RN

## 2019-11-25 NOTE — Progress Notes (Signed)
Patients temperature is 102.6 after checking given patients tylenol for a temperature of 100.9. MD paged. Waiting on orders from MD.  Lillia Pauls. RN

## 2019-11-25 NOTE — Progress Notes (Signed)
On arrival to pick pt up for TEE, pt c/o of 8/10 headache and temp 102.6.  Pt stated she didn't feel like having procedure today.  Notified bedside nurse.  Pt rescheduled for Thursday (next day with anesthesia availability).  Roselie Awkward, RN

## 2019-11-25 NOTE — Progress Notes (Signed)
PROGRESS NOTE    Dawn [name]   WIO:[id number]  DOB: 07-Jun-1981  DOA: 11/18/2019 PCP: Dawn [name]   Brief Narrative:  Dawn [name] 39 y.o.femalewith medical history significant forrheumatoid arthritis on Orencia who was recently admitted at [facility] for suspected meningitis and discharged on empiric Rocephin, oral Zyvoxx. After getting home, she passed out and returned to our ER with ongoing complaints of headaches, nausea, vomiting and weakness.  Subjective: Continues to have headaches, nausea, fever.     Assessment & Plan:   Principal Problem:   Meningitis? ongoing headaches, nausea, vomiting, fevers, syncope - she has been on antibiotics per ID but not improving - MRI > reveals numerous lesions- Neuro consulted for more input- I spoke with Dr Otelia Limes  - Neuro is doing further work up- Neuro also starting high dose steroids to treat for possible vasculitis  Active Problems: Vomiting - related to above- cont antiemetics and cont PPI for GI protection while on high dose steroids    Rheumatoid arthritis  - receiving Orencia as oupt    Syncope - ? Vasovagal - may have been dehydrated  Time spent in minutes: 35 DVT prophylaxis: SCDs Code Status: Full code Family Communication:  Disposition Plan: cont treatment per Neuro and ID recommendations Consultants:   ID  Neuro Procedures:     Antimicrobials:  Anti-infectives (From admission, onward)   Start     Dose/Rate Route Frequency Ordered Stop   11/24/19 2000  linezolid (ZYVOX) tablet 600 mg     600 mg Oral Every 12 hours 11/24/19 1109     11/21/19 0000  cefTRIAXone (ROCEPHIN) IVPB  Status:  Discontinued     2 g Intravenous Every 12 hours 11/21/19 1839 11/25/19    11/19/19 2000  linezolid (ZYVOX) IVPB 600 mg  Status:  Discontinued     600 mg 300 mL/hr over 60 Minutes Intravenous Every 12 hours 11/19/19 1556 11/24/19 1109   11/19/19 1600  vancomycin (VANCOREADY) IVPB 1250 mg/250 mL  Status:   Discontinued     1,250 mg 166.7 mL/hr over 90 Minutes Intravenous Every 8 hours 11/19/19 0548 11/19/19 1556   11/19/19 1400  cefTRIAXone (ROCEPHIN) 2 g in sodium chloride 0.9 % 100 mL IVPB  Status:  Discontinued     2 g 200 mL/hr over 30 Minutes Intravenous Every 12 hours 11/19/19 0530 11/25/19 1042   11/19/19 0230  linezolid (ZYVOX) IVPB 600 mg     600 mg 300 mL/hr over 60 Minutes Intravenous  Once 11/19/19 0201 11/19/19 0552   11/19/19 0215  cefTRIAXone (ROCEPHIN) 2 g in sodium chloride 0.9 % 100 mL IVPB     2 g 200 mL/hr over 30 Minutes Intravenous  Once 11/19/19 0201 11/19/19 0305       Objective: Vitals:   11/25/19 1056 11/25/19 1108 11/25/19 1321 11/25/19 1419  BP:      Pulse:      Resp: 16     Temp:  (!) 102.6 F (39.2 C) (!) 102.9 F (39.4 C) 100.1 F (37.8 C)  TempSrc:  Oral  Oral  SpO2:      Weight:      Height:        Intake/Output Summary (Last 24 hours) at 11/25/2019 1436 Last data filed at 11/25/2019 0601 Gross per 24 hour  Intake 2130.83 ml  Output 0 ml  Net 2130.83 ml   Filed Weights   11/21/19 2147 11/23/19 2138 11/24/19 2120  Weight: 61.9 kg 60.8 kg 61.8 kg  Examination: General exam: Appears comfortable  HEENT: PERRLA, oral mucosa moist, no sclera icterus or thrush Respiratory system: Clear to auscultation. Respiratory effort normal. Cardiovascular system: S1 & S2 heard,  No murmurs  Gastrointestinal system: Abdomen soft, non-tender, nondistended. Normal bowel sounds   Central nervous system: Alert and oriented. No focal neurological deficits. Extremities: No cyanosis, clubbing or edema Skin: No rashes or ulcers Psychiatry:  depressed     Data Reviewed: I have personally reviewed following labs and imaging studies  CBC: Recent Labs  Lab 11/18/19 2214 11/18/19 2214 11/20/19 0439 11/21/19 0819 11/22/19 0815 11/23/19 0340 11/24/19 0411  WBC 11.2*   < > 10.7* 9.3 10.8* 6.5 6.7  NEUTROABS 8.4*  --   --   --   --  5.5 4.1  HGB 11.6*    < > 10.1* 9.8* 10.2* 10.5* 9.6*  HCT 35.1*   < > 30.3* 29.5* 31.3* 32.1* 29.4*  MCV 82.4   < > 82.8 81.9 84.6 83.8 84.5  PLT 403*   < > 263 268 275 286 265   < > = values in this interval not displayed.   Basic Metabolic Panel: Recent Labs  Lab 11/20/19 0439 11/21/19 0819 11/22/19 0815 11/23/19 0340 11/24/19 0411  NA 139 138 133* 136 139  K 3.1* 3.2* 3.2* 3.7 3.1*  CL 107 107 101 102 105  CO2 23 23 23 24 25   GLUCOSE 111* 111* 116* 129* 101*  BUN <5* <5* <5* 5* 5*  CREATININE 0.41* 0.45 0.51 0.42* 0.43*  CALCIUM 7.8* 8.0* 7.8* 8.0* 7.9*  MG 1.8  --   --   --   --    GFR: Estimated Creatinine Clearance: 87.5 mL/min (A) (by C-G formula based on SCr of 0.43 mg/dL (L)). Liver Function Tests: Recent Labs  Lab 11/18/19 2214  AST 14*  ALT 11  ALKPHOS 69  BILITOT 0.6  PROT 6.2*  ALBUMIN 3.2*   No results for input(s): LIPASE, AMYLASE in the last 168 hours. No results for input(s): AMMONIA in the last 168 hours. Coagulation Profile: No results for input(s): INR, PROTIME in the last 168 hours. Cardiac Enzymes: No results for input(s): CKTOTAL, CKMB, CKMBINDEX, TROPONINI in the last 168 hours. BNP (last 3 results) No results for input(s): PROBNP in the last 8760 hours. HbA1C: No results for input(s): HGBA1C in the last 72 hours. CBG: No results for input(s): GLUCAP in the last 168 hours. Lipid Profile: No results for input(s): CHOL, HDL, LDLCALC, TRIG, CHOLHDL, LDLDIRECT in the last 72 hours. Thyroid Function Tests: No results for input(s): TSH, T4TOTAL, FREET4, T3FREE, THYROIDAB in the last 72 hours. Anemia Panel: No results for input(s): VITAMINB12, FOLATE, FERRITIN, TIBC, IRON, RETICCTPCT in the last 72 hours. Urine analysis:    Component Value Date/Time   COLORURINE YELLOW 11/18/2019 2206   APPEARANCEUR CLOUDY (A) 11/18/2019 2206   LABSPEC 1.028 11/18/2019 2206   PHURINE 7.0 11/18/2019 2206   GLUCOSEU NEGATIVE 11/18/2019 2206   HGBUR SMALL (A) 11/18/2019 2206    BILIRUBINUR NEGATIVE 11/18/2019 2206   [place] 11/18/2019 2206   PROTEINUR NEGATIVE 11/18/2019 2206   NITRITE NEGATIVE 11/18/2019 2206   LEUKOCYTESUR NEGATIVE 11/18/2019 2206   Sepsis Labs: @LABRCNTIP (procalcitonin:4,lacticidven:4) ) Recent Results (from the past 240 hour(s))  SARS CORONAVIRUS 2 (TAT 6-24 HRS) Nasopharyngeal Nasopharyngeal Swab     Status: None   Collection Time: 11/19/19  6:01 AM   Specimen: Nasopharyngeal Swab  Result Value Ref Range Status   SARS Coronavirus 2 NEGATIVE NEGATIVE Final  Comment: (NOTE) SARS-CoV-2 target nucleic acids are NOT DETECTED. The SARS-CoV-2 RNA is generally detectable in upper and lower respiratory specimens during the acute phase of infection. Negative results do not preclude SARS-CoV-2 infection, do not rule out co-infections with other pathogens, and should not be used as the sole basis for treatment or other patient management decisions. Negative results must be combined with clinical observations, patient history, and epidemiological information. The expected result is Negative. Fact Sheet for Patients: HairSlick.no Fact Sheet for Healthcare Providers: quierodirigir.com This test is not yet approved or cleared by the [place] FDA and  has been authorized for detection and/or diagnosis of SARS-CoV-2 by FDA under an Emergency Use Authorization (EUA). This EUA will remain  in effect (meaning this test can be used) for the duration of the COVID-19 declaration under Section 56 4(b)(1) of the Act, 21 U.S.C. section 360bbb-3(b)(1), unless the authorization is terminated or revoked sooner. Performed at [facility], [address] [place], [place] [address]   Culture, blood (routine x 2)     Status: None   Collection Time: 11/19/19  5:15 PM   Specimen: BLOOD RIGHT ARM  Result Value Ref Range Status   Specimen Description BLOOD RIGHT ARM  Final   Special  Requests   Final    BOTTLES DRAWN AEROBIC AND ANAEROBIC Blood Culture results may not be optimal due to an inadequate volume of blood received in culture bottles   Culture   Final    NO GROWTH 5 DAYS Performed at [facility], [address] [place], [place] [address]    Report Status 11/24/2019 FINAL  Final  Culture, blood (routine x 2)     Status: None   Collection Time: 11/19/19  5:20 PM   Specimen: BLOOD RIGHT HAND  Result Value Ref Range Status   Specimen Description BLOOD RIGHT HAND  Final   Special Requests   Final    AEROBIC BOTTLE ONLY Blood Culture results may not be optimal due to an inadequate volume of blood received in culture bottles   Culture   Final    NO GROWTH 5 DAYS Performed at [facility], [address], [place], [place] [address]    Report Status 11/24/2019 FINAL  Final         Radiology Studies: MR BRAIN W WO CONTRAST  Result Date: 11/24/2019 CLINICAL DATA:  Initial evaluation for meningitis, CNS infection, headache. EXAM: MRI HEAD WITHOUT AND WITH CONTRAST TECHNIQUE: Multiplanar, multiecho pulse sequences of the brain and surrounding structures were obtained without and with intravenous contrast. CONTRAST:  67mL GADAVIST GADOBUTROL 1 MMOL/ML IV SOLN COMPARISON:  Prior head CT from 11/19/2019. FINDINGS: Brain: Cerebral volume within normal limits for age. There are a few scattered subcentimeter foci of FLAIR signal abnormality seen along several cortical sulci, most pronounced within the left frontal lobe (series 11, image 19, 18), and left parietal lobe (series 11, image 20). Subtle FLAIR signal intensity seen along several cortical sulci within the parietooccipital regions (series 11, image 13, 12). Following contrast administration, there is subtle leptomeningeal enhancement, most notable posteriorly. Constellation of findings most suggestive of meningitis, consistent with provided history. Note made of an additional 1 cm FLAIR lesion involving the  right periatrial white matter (series 11, image 14), somewhat more indeterminate, but suspected to be related underlying infection/meningitis as well, as this lesion demonstrates subtle postcontrast enhancement (series 19, image 10). Small capillary telangiectasia or possibly demyelinating lesion would be the primary differential considerations.  No other focal parenchymal signal abnormality. No abnormal foci of restricted diffusion to suggest acute or subacute ischemia. Gray-white matter differentiation maintained. No encephalomalacia to suggest chronic cortical infarction. Minimal susceptibility artifact noted associated with the right periatrial lesion (series 14, image 29). No other evidence for acute or chronic intracranial hemorrhage. No mass lesion, midline shift or mass effect. No hydrocephalus. No visible intraventricular debris. Pituitary gland suprasellar region normal. Midline structures intact. Vascular: Major intracranial vascular flow voids are maintained. Skull and upper cervical spine: Craniocervical junction within normal limits. Upper cervical spine normal. Bone marrow signal intensity within normal limits. No scalp soft tissue abnormality. Sinuses/Orbits: Globes and orbital soft tissues demonstrate no acute finding. Paranasal sinuses are clear. No mastoid effusion. Inner ear structures grossly normal. Other: None. IMPRESSION: 1. Few scattered subcentimeter foci of FLAIR signal abnormality involving the juxtacortical white matter and cortical sulci of both cerebral hemispheres with associated subtle leptomeningeal enhancement. Constellation of findings suggestive of meningitis. 2. 1 cm FLAIR lesion involving the right periatrial white matter as above, somewhat more indeterminate, although suspected to be related to underlying infection/meningitis as well. Small capillary telangiectasia or demyelinating lesion would be the primary differential considerations. 3. Otherwise normal brain MRI. No other  acute intracranial process identified. Electronically Signed   By: Rise Mu M.D.   On: 11/24/2019 04:36      Scheduled Meds: . Chlorhexidine Gluconate Cloth  6 each Topical Daily  . feeding supplement  1 Container Oral TID BM  . linezolid  600 mg Oral Q12H  . [START ON 11/30/2019] methylPREDNISolone (SOLU-MEDROL) injection  40 mg Intravenous Daily  . metoCLOPramide (REGLAN) injection  5 mg Intravenous Q8H  . multivitamin with minerals  1 tablet Oral Daily  . pantoprazole (PROTONIX) IV  40 mg Intravenous Q24H  . sodium chloride flush  3 mL Intravenous Once  . sodium chloride flush  3 mL Intravenous Q12H   Continuous Infusions: . sodium chloride 100 mL/hr at 11/24/19 2338  . methylPREDNISolone (SOLU-MEDROL) injection 1,000 mg (11/25/19 1248)     LOS: 6 days      Calvert Cantor, MD [facility] Pager: [email or web address] Password [facility] 11/25/2019, 2:36 PM

## 2019-11-26 LAB — RPR: RPR Ser Ql: NONREACTIVE

## 2019-11-26 LAB — HIV ANTIBODY (ROUTINE TESTING W REFLEX): HIV Screen 4th Generation wRfx: NONREACTIVE

## 2019-11-26 LAB — CRYPTOCOCCAL ANTIGEN: Crypto Ag: NEGATIVE

## 2019-11-26 MED ORDER — ENSURE ENLIVE PO LIQD
237.0000 mL | Freq: Three times a day (TID) | ORAL | Status: DC
  Administered 2019-11-26 – 2019-11-29 (×3): 237 mL via ORAL

## 2019-11-26 NOTE — Plan of Care (Signed)
  Problem: Education: Goal: Knowledge of General Education information will improve Description: Including pain rating scale, medication(s)/side effects and non-pharmacologic comfort measures Outcome: Progressing   Problem: Clinical Measurements: Goal: Ability to maintain clinical measurements within normal limits will improve Outcome: Progressing   Problem: Nutrition: Goal: Adequate nutrition will be maintained Outcome: Progressing   Problem: Coping: Goal: Level of anxiety will decrease Outcome: Progressing   Problem: Pain Managment: Goal: General experience of comfort will improve Outcome: Progressing   

## 2019-11-26 NOTE — Progress Notes (Signed)
      Subjective:  Yesterday had a high fever 102 degrees with headaches and did not have a transesophageal echocardiogram   Antibiotics:  Anti-infectives (From admission, onward)   Start     Dose/Rate Route Frequency Ordered Stop   11/24/19 2000  linezolid (ZYVOX) tablet 600 mg     600 mg Oral Every 12 hours 11/24/19 1109     11/21/19 0000  cefTRIAXone (ROCEPHIN) IVPB  Status:  Discontinued     2 g Intravenous Every 12 hours 11/21/19 1839 11/25/19    11/19/19 2000  linezolid (ZYVOX) IVPB 600 mg  Status:  Discontinued     600 mg 300 mL/hr over 60 Minutes Intravenous Every 12 hours 11/19/19 1556 11/24/19 1109   11/19/19 1600  vancomycin (VANCOREADY) IVPB 1250 mg/250 mL  Status:  Discontinued     1,250 mg 166.7 mL/hr over 90 Minutes Intravenous Every 8 hours 11/19/19 0548 11/19/19 1556   11/19/19 1400  cefTRIAXone (ROCEPHIN) 2 g in sodium chloride 0.9 % 100 mL IVPB  Status:  Discontinued     2 g 200 mL/hr over 30 Minutes Intravenous Every 12 hours 11/19/19 0530 11/25/19 1042   11/19/19 0230  linezolid (ZYVOX) IVPB 600 mg     600 mg 300 mL/hr over 60 Minutes Intravenous  Once 11/19/19 0201 11/19/19 0552   11/19/19 0215  cefTRIAXone (ROCEPHIN) 2 g in sodium chloride 0.9 % 100 mL IVPB     2 g 200 mL/hr over 30 Minutes Intravenous  Once 11/19/19 0201 11/19/19 0305      Medications: Scheduled Meds: . Chlorhexidine Gluconate Cloth  6 each Topical Daily  . feeding supplement (ENSURE ENLIVE)  237 mL Oral TID BM  . linezolid  600 mg Oral Q12H  . metoCLOPramide (REGLAN) injection  5 mg Intravenous Q8H  . multivitamin with minerals  1 tablet Oral Daily  . pantoprazole (PROTONIX) IV  40 mg Intravenous Q24H  . sodium chloride flush  3 mL Intravenous Once  . sodium chloride flush  3 mL Intravenous Q12H   Continuous Infusions: . sodium chloride 75 mL/hr at 11/26/19 0525  . methylPREDNISolone (SOLU-MEDROL) injection 1,000 mg (11/26/19 0838)   PRN Meds:.acetaminophen, gadobutrol,  heparin lock flush, HYDROcodone-acetaminophen, ondansetron (ZOFRAN) IV, promethazine, simethicone, sodium chloride flush, SUMAtriptan    Objective: Weight change:   Intake/Output Summary (Last 24 hours) at 11/26/2019 1524 Last data filed at 11/26/2019 1500 Gross per 24 hour  Intake 2213.23 ml  Output 1250 ml  Net 963.23 ml   Blood pressure 105/60, pulse (!) 51, temperature 97.9 F (36.6 C), temperature source Oral, resp. rate 20, height 5' 6" (1.676 m), weight 61.8 kg, SpO2 96 %, unknown if currently breastfeeding. Temp:  [97.8 F (36.6 C)-99.1 F (37.3 C)] 97.9 F (36.6 C) (02/03 0915) Pulse Rate:  [51-85] 51 (02/03 0915) Resp:  [18-20] 20 (02/03 0915) BP: (99-123)/(54-62) 105/60 (02/03 0915) SpO2:  [96 %-100 %] 96 % (02/03 0915)  Physical Exam: General: Alert and awake, oriented x3, not in any acute distress and looks more comfortable today compared to yesterday. HEENT: anicteric sclera, EOMI CVS regular rate, normal  Chest: , no wheezing, no respiratory distress Abdomen: soft non-distended,  Extremities: no edema or deformity noted bilaterally Skin: no rashes Neuro: nonfocal  CBC:    BMET Recent Labs    11/24/19 0411  NA 139  K 3.1*  CL 105  CO2 25  GLUCOSE 101*  BUN 5*  CREATININE 0.43*  CALCIUM 7.9*       Liver Panel  No results for input(s): PROT, ALBUMIN, AST, ALT, ALKPHOS, BILITOT, BILIDIR, IBILI in the last 72 hours.     Sedimentation Rate No results for input(s): ESRSEDRATE in the last 72 hours. C-Reactive Protein No results for input(s): CRP in the last 72 hours.  Micro Results: Recent Results (from the past 720 hour(s))  SARS CORONAVIRUS 2 (TAT 6-24 HRS) Nasopharyngeal Nasopharyngeal Swab     Status: None   Collection Time: 11/19/19  6:01 AM   Specimen: Nasopharyngeal Swab  Result Value Ref Range Status   SARS Coronavirus 2 NEGATIVE NEGATIVE Final    Comment: (NOTE) SARS-CoV-2 target nucleic acids are NOT DETECTED. The SARS-CoV-2 RNA is  generally detectable in upper and lower respiratory specimens during the acute phase of infection. Negative results do not preclude SARS-CoV-2 infection, do not rule out co-infections with other pathogens, and should not be used as the sole basis for treatment or other patient management decisions. Negative results must be combined with clinical observations, patient history, and epidemiological information. The expected result is Negative. Fact Sheet for Patients: HairSlick.no Fact Sheet for Healthcare Providers: quierodirigir.com This test is not yet approved or cleared by the [place] FDA and  has been authorized for detection and/or diagnosis of SARS-CoV-2 by FDA under an Emergency Use Authorization (EUA). This EUA will remain  in effect (meaning this test can be used) for the duration of the COVID-19 declaration under Section 56 4(b)(1) of the Act, 21 U.S.C. section 360bbb-3(b)(1), unless the authorization is terminated or revoked sooner. Performed at [facility], [address], [place], [place] [address]   Culture, blood (routine x 2)     Status: None   Collection Time: 11/19/19  5:15 PM   Specimen: BLOOD RIGHT ARM  Result Value Ref Range Status   Specimen Description BLOOD RIGHT ARM  Final   Special Requests   Final    BOTTLES DRAWN AEROBIC AND ANAEROBIC Blood Culture results may not be optimal due to an inadequate volume of blood received in culture bottles   Culture   Final    NO GROWTH 5 DAYS Performed at [facility], [address] [place], [place] [address]    Report Status 11/24/2019 FINAL  Final  Culture, blood (routine x 2)     Status: None   Collection Time: 11/19/19  5:20 PM   Specimen: BLOOD RIGHT HAND  Result Value Ref Range Status   Specimen Description BLOOD RIGHT HAND  Final   Special Requests   Final    AEROBIC BOTTLE ONLY Blood Culture results may not be optimal due to an  inadequate volume of blood received in culture bottles   Culture   Final    NO GROWTH 5 DAYS Performed at [facility], [address], [place], [place] [address]    Report Status 11/24/2019 FINAL  Final    Studies/Results: No results found.    Assessment/Plan:  INTERVAL HISTORY: TEE was abandoned due to patient not being willing to go through with it with her high fever yesterday.  Since then she has been afebrile actually feels much better on higher dose corticosteroids  Principal Problem:   Meningitis Active Problems:   Rheumatoid arthritis with rheumatoid factor of multiple sites without organ or systems involvement (HCC)   Syncope    Dillan Lunden is a 39 y.o. female with rheumatoid arthritis who presented with what is an apparent aseptic meningitis.  She has been getting linezolid along with ceftriaxone  for the possibility that she has a bacterial infection though cultures were sterile from outside hospital.  MRI of the brain showed  multiple areas of hyperintensity that radiology feels consistent with meningitis though they also raise ? Of hospital area of demyelination.  She has now been formally seen by neurology who are initiating autoimmune work-up.  I discontinued her ceftriaxone and keep her on Zyvox alone for now  While she did have a high fever yesterday it was prior to initiating high-dose corticosteroids and since then she has had improvement.  If she continues to improve on steroids  in terms of her headaches and fevers and no cardiac pathology found on transesophageal echocardiogram then I would simply finish out a 2-week course of total antimicrobial therapy with oral Zyvox.  If however her fevers and headaches recur she does need a large lumbar puncture as described yesterday.     LOS: 7 days   Alcide Evener 11/26/2019, 3:24 PM

## 2019-11-26 NOTE — H&P (View-Only) (Signed)
Subjective:  Yesterday had a high fever 102 degrees with headaches and did not have a transesophageal echocardiogram   Antibiotics:  Anti-infectives (From admission, onward)   Start     Dose/Rate Route Frequency Ordered Stop   11/24/19 2000  linezolid (ZYVOX) tablet 600 mg     600 mg Oral Every 12 hours 11/24/19 1109     11/21/19 0000  cefTRIAXone (ROCEPHIN) IVPB  Status:  Discontinued     2 g Intravenous Every 12 hours 11/21/19 1839 11/25/19    11/19/19 2000  linezolid (ZYVOX) IVPB 600 mg  Status:  Discontinued     600 mg 300 mL/hr over 60 Minutes Intravenous Every 12 hours 11/19/19 1556 11/24/19 1109   11/19/19 1600  vancomycin (VANCOREADY) IVPB 1250 mg/250 mL  Status:  Discontinued     1,250 mg 166.7 mL/hr over 90 Minutes Intravenous Every 8 hours 11/19/19 0548 11/19/19 1556   11/19/19 1400  cefTRIAXone (ROCEPHIN) 2 g in sodium chloride 0.9 % 100 mL IVPB  Status:  Discontinued     2 g 200 mL/hr over 30 Minutes Intravenous Every 12 hours 11/19/19 0530 11/25/19 1042   11/19/19 0230  linezolid (ZYVOX) IVPB 600 mg     600 mg 300 mL/hr over 60 Minutes Intravenous  Once 11/19/19 0201 11/19/19 0552   11/19/19 0215  cefTRIAXone (ROCEPHIN) 2 g in sodium chloride 0.9 % 100 mL IVPB     2 g 200 mL/hr over 30 Minutes Intravenous  Once 11/19/19 0201 11/19/19 0305      Medications: Scheduled Meds: . Chlorhexidine Gluconate Cloth  6 each Topical Daily  . feeding supplement (ENSURE ENLIVE)  237 mL Oral TID BM  . linezolid  600 mg Oral Q12H  . metoCLOPramide (REGLAN) injection  5 mg Intravenous Q8H  . multivitamin with minerals  1 tablet Oral Daily  . pantoprazole (PROTONIX) IV  40 mg Intravenous Q24H  . sodium chloride flush  3 mL Intravenous Once  . sodium chloride flush  3 mL Intravenous Q12H   Continuous Infusions: . sodium chloride 75 mL/hr at 11/26/19 0525  . methylPREDNISolone (SOLU-MEDROL) injection 1,000 mg (11/26/19 0838)   PRN Meds:.acetaminophen, gadobutrol,  heparin lock flush, HYDROcodone-acetaminophen, ondansetron (ZOFRAN) IV, promethazine, simethicone, sodium chloride flush, SUMAtriptan    Objective: Weight change:   Intake/Output Summary (Last 24 hours) at 11/26/2019 1524 Last data filed at 11/26/2019 1500 Gross per 24 hour  Intake 2213.23 ml  Output 1250 ml  Net 963.23 ml   Blood pressure 105/60, pulse (!) 51, temperature 97.9 F (36.6 C), temperature source Oral, resp. rate 20, height 5\' 6"  (1.676 m), weight 61.8 kg, SpO2 96 %, unknown if currently breastfeeding. Temp:  [97.8 F (36.6 C)-99.1 F (37.3 C)] 97.9 F (36.6 C) (02/03 0915) Pulse Rate:  [51-85] 51 (02/03 0915) Resp:  [18-20] 20 (02/03 0915) BP: (99-123)/(54-62) 105/60 (02/03 0915) SpO2:  [96 %-100 %] 96 % (02/03 0915)  Physical Exam: General: Alert and awake, oriented x3, not in any acute distress and looks more comfortable today compared to yesterday. HEENT: anicteric sclera, EOMI CVS regular rate, normal  Chest: , no wheezing, no respiratory distress Abdomen: soft non-distended,  Extremities: no edema or deformity noted bilaterally Skin: no rashes Neuro: nonfocal  CBC:    BMET Recent Labs    11/24/19 0411  NA 139  K 3.1*  CL 105  CO2 25  GLUCOSE 101*  BUN 5*  CREATININE 0.43*  CALCIUM 7.9*  Liver Panel  No results for input(s): PROT, ALBUMIN, AST, ALT, ALKPHOS, BILITOT, BILIDIR, IBILI in the last 72 hours.     Sedimentation Rate No results for input(s): ESRSEDRATE in the last 72 hours. C-Reactive Protein No results for input(s): CRP in the last 72 hours.  Micro Results: Recent Results (from the past 720 hour(s))  SARS CORONAVIRUS 2 (TAT 6-24 HRS) Nasopharyngeal Nasopharyngeal Swab     Status: None   Collection Time: 11/19/19  6:01 AM   Specimen: Nasopharyngeal Swab  Result Value Ref Range Status   SARS Coronavirus 2 NEGATIVE NEGATIVE Final    Comment: (NOTE) SARS-CoV-2 target nucleic acids are NOT DETECTED. The SARS-CoV-2 RNA is  generally detectable in upper and lower respiratory specimens during the acute phase of infection. Negative results do not preclude SARS-CoV-2 infection, do not rule out co-infections with other pathogens, and should not be used as the sole basis for treatment or other patient management decisions. Negative results must be combined with clinical observations, patient history, and epidemiological information. The expected result is Negative. Fact Sheet for Patients: HairSlick.no Fact Sheet for Healthcare Providers: quierodirigir.com This test is not yet approved or cleared by the [place] FDA and  has been authorized for detection and/or diagnosis of SARS-CoV-2 by FDA under an Emergency Use Authorization (EUA). This EUA will remain  in effect (meaning this test can be used) for the duration of the COVID-19 declaration under Section 56 4(b)(1) of the Act, 21 U.S.C. section 360bbb-3(b)(1), unless the authorization is terminated or revoked sooner. Performed at [facility], [address], [place], [place] [address]   Culture, blood (routine x 2)     Status: None   Collection Time: 11/19/19  5:15 PM   Specimen: BLOOD RIGHT ARM  Result Value Ref Range Status   Specimen Description BLOOD RIGHT ARM  Final   Special Requests   Final    BOTTLES DRAWN AEROBIC AND ANAEROBIC Blood Culture results may not be optimal due to an inadequate volume of blood received in culture bottles   Culture   Final    NO GROWTH 5 DAYS Performed at [facility], [address] [place], [place] [address]    Report Status 11/24/2019 FINAL  Final  Culture, blood (routine x 2)     Status: None   Collection Time: 11/19/19  5:20 PM   Specimen: BLOOD RIGHT HAND  Result Value Ref Range Status   Specimen Description BLOOD RIGHT HAND  Final   Special Requests   Final    AEROBIC BOTTLE ONLY Blood Culture results may not be optimal due to an  inadequate volume of blood received in culture bottles   Culture   Final    NO GROWTH 5 DAYS Performed at [facility], [address], [place], [place] [address]    Report Status 11/24/2019 FINAL  Final    Studies/Results: No results found.    Assessment/Plan:  INTERVAL HISTORY: TEE was abandoned due to patient not being willing to go through with it with her high fever yesterday.  Since then she has been afebrile actually feels much better on higher dose corticosteroids  Principal Problem:   Meningitis Active Problems:   Rheumatoid arthritis with rheumatoid factor of multiple sites without organ or systems involvement (HCC)   Syncope    Dillan Lunden is a 39 y.o. female with rheumatoid arthritis who presented with what is an apparent aseptic meningitis.  She has been getting linezolid along with ceftriaxone  for the possibility that she has a bacterial infection though cultures were sterile from outside hospital.  MRI of the brain showed  multiple areas of hyperintensity that radiology feels consistent with meningitis though they also raise ? Of hospital area of demyelination.  She has now been formally seen by neurology who are initiating autoimmune work-up.  I discontinued her ceftriaxone and keep her on Zyvox alone for now  While she did have a high fever yesterday it was prior to initiating high-dose corticosteroids and since then she has had improvement.  If she continues to improve on steroids  in terms of her headaches and fevers and no cardiac pathology found on transesophageal echocardiogram then I would simply finish out a 2-week course of total antimicrobial therapy with oral Zyvox.  If however her fevers and headaches recur she does need a large lumbar puncture as described yesterday.     LOS: 7 days   Alcide Evener 11/26/2019, 3:24 PM

## 2019-11-26 NOTE — Progress Notes (Signed)
Nutrition Follow up  DOCUMENTATION CODES:   Not applicable  INTERVENTION:   Consider Cortrak if intake remains poor (day 7 without adequate nutrition).    Ensure Enlive po TID, each supplement provides 350 kcal and 20 grams of protein  MVI daily   NUTRITION DIAGNOSIS:   Inadequate oral intake related to nausea as evidenced by per patient/family report.  Ongoing  GOAL:   Patient will meet greater than or equal to 90% of their needs   Not meeting   MONITOR:   PO intake, Supplement acceptance, Diet advancement, Weight trends, Labs, I & O's  REASON FOR ASSESSMENT:   Malnutrition Screening Tool    ASSESSMENT:   Patient with PMH significant for RA on Orencia. Recently admitted at [facility] for suspected meningitis and was sent home with PICC/antibiotics. Presents this admission with worsening headache, neck pain, and nausea.   Suspicion for vasculitis per Neuro. Started on IV steroids.   Pt reports nausea and vomiting continues despite zofran and reglan. Having regular BMs. Reports she had a smoothie for breakfast and vomited after two hours. Meal completions charted as 0-100% for her last eight meals (22% average). Not drinking Boost Breeze as it makes nausea worse. Pt willing to try Ensure. Discussed the importance of meeting nutrition needs to prevent weight loss. If intake does not progress consider Cortrak as pt is day 7 without adequate nutrition.   Admission weight: 59.9 kg  Current weight: 61.8 kg   Drips: solumedrol, NS @ 75 ml/hr  Medications: 5 mg reglan BID, MVI with minerals Labs: K 3.1 (L)   Diet Order:   Diet Order            Diet regular Room service appropriate? Yes; Fluid consistency: Thin  Diet effective now        Diet general        Diet - low sodium heart healthy              EDUCATION NEEDS:   Not appropriate for education at this time  Skin:  Skin Assessment: Reviewed RN Assessment  Last BM:  2/2  Height:   Ht Readings  from Last 1 Encounters:  11/18/19 5\' 6"  (1.676 m)    Weight:   Wt Readings from Last 1 Encounters:  11/24/19 61.8 kg    Ideal Body Weight:  59.1 kg  BMI:  Body mass index is 21.99 kg/m.  Estimated Nutritional Needs:   Kcal:  1800-2000 kcal  Protein:  90-105 grams  Fluid:  >/= 1.8 L/day   01/22/20 RD, LDN Clinical Nutrition Pager # - [phone]

## 2019-11-26 NOTE — Progress Notes (Signed)
PROGRESS NOTE    Dawn [name]   BPZ:[id number]  DOB: 09-18-1981  DOA: 11/18/2019 PCP: [facility]   Brief Narrative:  Dawn [name] 39 y.o.femalewith medical history significant forrheumatoid arthritis on Orencia who was recently admitted at [facility] for suspected meningitis and discharged on empiric Rocephin, oral Zyvoxx. After getting home, she passed out and returned to our ER with ongoing complaints of headaches, nausea, vomiting and weakness.  Subjective: She feels the steroids and the Imitrex are helping her headache. Still not able to eat or drink because of nausea. Husband will get her a smoothie which she will try.   She has been sweating a great deal due to the steroids which is common for her.   Assessment & Plan:   Principal Problem:   Meningitis? ongoing headaches, nausea, vomiting, fevers, syncope - she has been on antibiotics per ID but not improving - MRI > reveals numerous lesions- Neuro consulted for more input- I spoke with Dr Otelia Limes who has a suspicion for vasculitis - doing further work up- starting high dose steroids to treat for possible vasculitis - fever 102 on 2/2 - she could not go for TEE as she felt bad - today she feels like she is improving- follow fever curve  Active Problems: Vomiting - related to above- cont antiemetics and cont PPI for GI protection while on high dose steroids - cont IVF as she is still unable to drink to meet her needs - start measuring I and Os due to excessive sweating    Rheumatoid arthritis  - receiving Orencia as oupt    Syncope - ? Vasovagal - may have been dehydrated  Time spent in minutes: 35 DVT prophylaxis: SCDs Code Status: Full code Family Communication:  Disposition Plan: cont treatment per Neuro and ID recommendations- cont IVF due to excess sweating and poor oral intake Consultants:   ID  Neuro Procedures:     Antimicrobials:  Anti-infectives (From admission, onward)   Start     Dose/Rate Route Frequency Ordered Stop   11/24/19 2000  linezolid (ZYVOX) tablet 600 mg     600 mg Oral Every 12 hours 11/24/19 1109     11/21/19 0000  cefTRIAXone (ROCEPHIN) IVPB  Status:  Discontinued     2 g Intravenous Every 12 hours 11/21/19 1839 11/25/19    11/19/19 2000  linezolid (ZYVOX) IVPB 600 mg  Status:  Discontinued     600 mg 300 mL/hr over 60 Minutes Intravenous Every 12 hours 11/19/19 1556 11/24/19 1109   11/19/19 1600  vancomycin (VANCOREADY) IVPB 1250 mg/250 mL  Status:  Discontinued     1,250 mg 166.7 mL/hr over 90 Minutes Intravenous Every 8 hours 11/19/19 0548 11/19/19 1556   11/19/19 1400  cefTRIAXone (ROCEPHIN) 2 g in sodium chloride 0.9 % 100 mL IVPB  Status:  Discontinued     2 g 200 mL/hr over 30 Minutes Intravenous Every 12 hours 11/19/19 0530 11/25/19 1042   11/19/19 0230  linezolid (ZYVOX) IVPB 600 mg     600 mg 300 mL/hr over 60 Minutes Intravenous  Once 11/19/19 0201 11/19/19 0552   11/19/19 0215  cefTRIAXone (ROCEPHIN) 2 g in sodium chloride 0.9 % 100 mL IVPB     2 g 200 mL/hr over 30 Minutes Intravenous  Once 11/19/19 0201 11/19/19 0305       Objective: Vitals:   11/25/19 1741 11/25/19 2126 11/26/19 0530 11/26/19 0915  BP: 123/62 105/61 (!) 99/54 105/60  Pulse: 83 85 (!) 55 (!)  51  Resp: 18 18 18 20   Temp: 98.7 F (37.1 C) 99.1 F (37.3 C) 97.8 F (36.6 C) 97.9 F (36.6 C)  TempSrc: Oral Oral Oral Oral  SpO2: 100% 97% 98% 96%  Weight:      Height:        Intake/Output Summary (Last 24 hours) at 11/26/2019 1108 Last data filed at 11/26/2019 0935 Gross per 24 hour  Intake 2287.84 ml  Output 400 ml  Net 1887.84 ml   Filed Weights   11/21/19 2147 11/23/19 2138 11/24/19 2120  Weight: 61.9 kg 60.8 kg 61.8 kg    Examination: General exam: Appears comfortable  HEENT: PERRLA, oral mucosa moist, no sclera icterus or thrush Respiratory system: Clear to auscultation. Respiratory effort normal. Cardiovascular system: S1 & S2 heard,   No murmurs  Gastrointestinal system: Abdomen soft, non-tender, nondistended. Normal bowel sounds   Central nervous system: Alert and oriented. No focal neurological deficits. Extremities: No cyanosis, clubbing or edema Skin: No rashes or ulcers Psychiatry:  depressed     Data Reviewed: I have personally reviewed following labs and imaging studies  CBC: Recent Labs  Lab 11/20/19 0439 11/21/19 0819 11/22/19 0815 11/23/19 0340 11/24/19 0411  WBC 10.7* 9.3 10.8* 6.5 6.7  NEUTROABS  --   --   --  5.5 4.1  HGB 10.1* 9.8* 10.2* 10.5* 9.6*  HCT 30.3* 29.5* 31.3* 32.1* 29.4*  MCV 82.8 81.9 84.6 83.8 84.5  PLT 263 268 275 286 265   Basic Metabolic Panel: Recent Labs  Lab 11/20/19 0439 11/21/19 0819 11/22/19 0815 11/23/19 0340 11/24/19 0411  NA 139 138 133* 136 139  K 3.1* 3.2* 3.2* 3.7 3.1*  CL 107 107 101 102 105  CO2 23 23 23 24 25   GLUCOSE 111* 111* 116* 129* 101*  BUN <5* <5* <5* 5* 5*  CREATININE 0.41* 0.45 0.51 0.42* 0.43*  CALCIUM 7.8* 8.0* 7.8* 8.0* 7.9*  MG 1.8  --   --   --   --    GFR: Estimated Creatinine Clearance: 87.5 mL/min (A) (by C-G formula based on SCr of 0.43 mg/dL (L)). Liver Function Tests: No results for input(s): AST, ALT, ALKPHOS, BILITOT, PROT, ALBUMIN in the last 168 hours. No results for input(s): LIPASE, AMYLASE in the last 168 hours. No results for input(s): AMMONIA in the last 168 hours. Coagulation Profile: No results for input(s): INR, PROTIME in the last 168 hours. Cardiac Enzymes: No results for input(s): CKTOTAL, CKMB, CKMBINDEX, TROPONINI in the last 168 hours. BNP (last 3 results) No results for input(s): PROBNP in the last 8760 hours. HbA1C: No results for input(s): HGBA1C in the last 72 hours. CBG: No results for input(s): GLUCAP in the last 168 hours. Lipid Profile: No results for input(s): CHOL, HDL, LDLCALC, TRIG, CHOLHDL, LDLDIRECT in the last 72 hours. Thyroid Function Tests: No results for input(s): TSH, T4TOTAL,  FREET4, T3FREE, THYROIDAB in the last 72 hours. Anemia Panel: No results for input(s): VITAMINB12, FOLATE, FERRITIN, TIBC, IRON, RETICCTPCT in the last 72 hours. Urine analysis:    Component Value Date/Time   COLORURINE YELLOW 11/18/2019 2206   APPEARANCEUR CLOUDY (A) 11/18/2019 2206   LABSPEC 1.028 11/18/2019 2206   PHURINE 7.0 11/18/2019 2206   GLUCOSEU NEGATIVE 11/18/2019 2206   HGBUR SMALL (A) 11/18/2019 2206   BILIRUBINUR NEGATIVE 11/18/2019 2206   KETONESUR NEGATIVE 11/18/2019 2206   PROTEINUR NEGATIVE 11/18/2019 2206   NITRITE NEGATIVE 11/18/2019 2206   LEUKOCYTESUR NEGATIVE 11/18/2019 2206   Sepsis Labs: @LABRCNTIP (procalcitonin:4,lacticidven:4) )  Recent Results (from the past 240 hour(s))  SARS CORONAVIRUS 2 (TAT 6-24 HRS) Nasopharyngeal Nasopharyngeal Swab     Status: None   Collection Time: 11/19/19  6:01 AM   Specimen: Nasopharyngeal Swab  Result Value Ref Range Status   SARS Coronavirus 2 NEGATIVE NEGATIVE Final    Comment: (NOTE) SARS-CoV-2 target nucleic acids are NOT DETECTED. The SARS-CoV-2 RNA is generally detectable in upper and lower respiratory specimens during the acute phase of infection. Negative results do not preclude SARS-CoV-2 infection, do not rule out co-infections with other pathogens, and should not be used as the sole basis for treatment or other patient management decisions. Negative results must be combined with clinical observations, patient history, and epidemiological information. The expected result is Negative. Fact Sheet for Patients: SugarRoll.be Fact Sheet for Healthcare Providers: https://www.woods-mathews.com/ This test is not yet approved or cleared by the [place] FDA and  has been authorized for detection and/or diagnosis of SARS-CoV-2 by FDA under an Emergency Use Authorization (EUA). This EUA will remain  in effect (meaning this test can be used) for the duration of the COVID-19  declaration under Section 56 4(b)(1) of the Act, 21 U.S.C. section 360bbb-3(b)(1), unless the authorization is terminated or revoked sooner. Performed at [facility], [place] [address] [place], [place] [address]   Culture, blood (routine x 2)     Status: None   Collection Time: 11/19/19  5:15 PM   Specimen: BLOOD RIGHT ARM  Result Value Ref Range Status   Specimen Description BLOOD RIGHT ARM  Final   Special Requests   Final    BOTTLES DRAWN AEROBIC AND ANAEROBIC Blood Culture results may not be optimal due to an inadequate volume of blood received in culture bottles   Culture   Final    NO GROWTH 5 DAYS Performed at [facility], [place] [address], [place], [place] [address]    Report Status 11/24/2019 FINAL  Final  Culture, blood (routine x 2)     Status: None   Collection Time: 11/19/19  5:20 PM   Specimen: BLOOD RIGHT HAND  Result Value Ref Range Status   Specimen Description BLOOD RIGHT HAND  Final   Special Requests   Final    AEROBIC BOTTLE ONLY Blood Culture results may not be optimal due to an inadequate volume of blood received in culture bottles   Culture   Final    NO GROWTH 5 DAYS Performed at [facility], [place] [address] [place], [place] [address]    Report Status 11/24/2019 FINAL  Final         Radiology Studies: No results found.    Scheduled Meds: . Chlorhexidine Gluconate Cloth  6 each Topical Daily  . linezolid  600 mg Oral Q12H  . metoCLOPramide (REGLAN) injection  5 mg Intravenous Q8H  . multivitamin with minerals  1 tablet Oral Daily  . pantoprazole (PROTONIX) IV  40 mg Intravenous Q24H  . sodium chloride flush  3 mL Intravenous Once  . sodium chloride flush  3 mL Intravenous Q12H   Continuous Infusions: . sodium chloride 75 mL/hr at 11/26/19 0525  . methylPREDNISolone (SOLU-MEDROL) injection 1,000 mg (11/26/19 0838)     LOS: 7 days      Debbe Odea, MD [facility] Pager: [email or web address] Password [email or web address]  11/26/2019, 11:08 AM

## 2019-11-26 NOTE — Plan of Care (Signed)
  Problem: Clinical Measurements: Goal: Ability to maintain clinical measurements within normal limits will improve Outcome: Progressing   

## 2019-11-26 NOTE — Progress Notes (Signed)
Patient's sister called with concerns about " why an angiogram was not done today". She would like the hospitalist to give her call tomorrow about her sister's procedure.   Dawn [name]

## 2019-11-27 ENCOUNTER — Inpatient Hospital Stay (HOSPITAL_COMMUNITY): Payer: BC Managed Care – PPO

## 2019-11-27 ENCOUNTER — Encounter (HOSPITAL_COMMUNITY)
Admission: EM | Disposition: A | Discharge status: Home / Self Care | Payer: Self-pay | Source: Home / Self Care | Attending: Internal Medicine

## 2019-11-27 ENCOUNTER — Inpatient Hospital Stay (HOSPITAL_COMMUNITY): Payer: BC Managed Care – PPO | Admitting: Certified Registered"

## 2019-11-27 ENCOUNTER — Encounter (HOSPITAL_COMMUNITY): Payer: Self-pay | Admitting: Internal Medicine

## 2019-11-27 DIAGNOSIS — I38 Endocarditis, valve unspecified: Secondary | ICD-10-CM

## 2019-11-27 DIAGNOSIS — M057A Rheumatoid arthritis with rheumatoid factor of other specified site without organ or systems involvement: Secondary | ICD-10-CM

## 2019-11-27 HISTORY — PX: TEE WITHOUT CARDIOVERSION: SHX5443

## 2019-11-27 HISTORY — PX: BUBBLE STUDY: SHX6837

## 2019-11-27 LAB — BASIC METABOLIC PANEL
Anion gap: 8 (ref 5–15)
BUN: 17 mg/dL (ref 6–20)
CO2: 25 mmol/L (ref 22–32)
Calcium: 8.3 mg/dL — ABNORMAL LOW (ref 8.9–10.3)
Chloride: 106 mmol/L (ref 98–111)
Creatinine, Ser: 0.46 mg/dL (ref 0.44–1.00)
GFR calc Af Amer: >60 mL/min (ref >60)
GFR calc non Af Amer: >60 mL/min (ref >60)
Glucose, Bld: 140 mg/dL — ABNORMAL HIGH (ref 70–99)
Potassium: 3.8 mmol/L (ref 3.5–5.1)
Sodium: 139 mmol/L (ref 135–145)

## 2019-11-27 LAB — C-REACTIVE PROTEIN: CRP: 0.7 mg/dL (ref <1.0)

## 2019-11-27 LAB — CBC
HCT: 31.1 % — ABNORMAL LOW (ref 36.0–46.0)
Hemoglobin: 10.2 g/dL — ABNORMAL LOW (ref 12.0–15.0)
MCH: 27.6 pg (ref 26.0–34.0)
MCHC: 32.8 g/dL (ref 30.0–36.0)
MCV: 84.3 fL (ref 80.0–100.0)
Platelets: 249 10*3/uL (ref 150–400)
RBC: 3.69 MIL/uL — ABNORMAL LOW (ref 3.87–5.11)
RDW: 14.5 % (ref 11.5–15.5)
WBC: 8.3 10*3/uL (ref 4.0–10.5)
nRBC: 0 % (ref 0.0–0.2)

## 2019-11-27 LAB — HEPATITIS PANEL, ACUTE
HCV Ab: NONREACTIVE
Hep A IgM: NONREACTIVE
Hep B C IgM: NONREACTIVE
Hepatitis B Surface Ag: NONREACTIVE

## 2019-11-27 LAB — HISTOPLASMA ANTIGEN, URINE: Histoplasma Antigen, urine: <0.5 (ref <0.5)

## 2019-11-27 LAB — SEDIMENTATION RATE: Sed Rate: 5 mm/hr (ref 0–22)

## 2019-11-27 SURGERY — ECHOCARDIOGRAM, TRANSESOPHAGEAL
Anesthesia: Monitor Anesthesia Care

## 2019-11-27 MED ORDER — GENERIC EXTERNAL MEDICATION
Status: DC
Start: ? — End: 2019-11-27

## 2019-11-27 MED ORDER — PROPOFOL 10 MG/ML IV BOLUS
INTRAVENOUS | Status: DC | PRN
  Administered 2019-11-27: 30 ug via INTRAVENOUS
  Administered 2019-11-27: 20 ug via INTRAVENOUS
  Administered 2019-11-27: 40 ug via INTRAVENOUS
  Administered 2019-11-27: 20 ug via INTRAVENOUS

## 2019-11-27 MED ORDER — LIDOCAINE VISCOUS HCL 2 % MT SOLN
OROMUCOSAL | Status: AC
  Filled 2019-11-27: qty 15

## 2019-11-27 MED ORDER — SODIUM CHLORIDE 0.9 % IV SOLN: INTRAVENOUS | Status: DC

## 2019-11-27 MED ORDER — SODIUM CHLORIDE 0.9 % IV SOLN: INTRAVENOUS | Status: DC | PRN

## 2019-11-27 MED ORDER — LIDOCAINE VISCOUS HCL 2 % MT SOLN
OROMUCOSAL | Status: DC | PRN
  Administered 2019-11-27: 20 mL via OROMUCOSAL

## 2019-11-27 MED ORDER — PROPOFOL 500 MG/50ML IV EMUL
INTRAVENOUS | Status: DC | PRN
  Administered 2019-11-27: 150 ug/kg/min via INTRAVENOUS

## 2019-11-27 NOTE — Anesthesia Postprocedure Evaluation (Signed)
Anesthesia Post Note  Patient: Dawn [name]  Procedure(s) Performed: TRANSESOPHAGEAL ECHOCARDIOGRAM (TEE) (N/A ) BUBBLE STUDY     Patient location during evaluation: PACU Anesthesia Type: MAC Level of consciousness: awake and alert Pain management: pain level controlled Vital Signs Assessment: post-procedure vital signs reviewed and stable Respiratory status: spontaneous breathing, nonlabored ventilation, respiratory function stable and patient connected to nasal cannula oxygen Cardiovascular status: stable and blood pressure returned to baseline Postop Assessment: no apparent nausea or vomiting Anesthetic complications: no    Last Vitals:  Vitals:   11/27/19 1136 11/27/19 1146  BP: 117/60 126/79  Pulse: (!) 49 (!) 48  Resp: 19 17  Temp:    SpO2: 98% 99%    Last Pain:  Vitals:   11/27/19 1136  TempSrc:   PainSc: Asleep                 Geraldyn Shain DAVID

## 2019-11-27 NOTE — Progress Notes (Signed)
NEUROLOGY PROGRESS NOTE  Subjective: Patient is lying in bed.  She feels comfortable.  Patient states that her headache is fully resolved.  Patient has multiple questions about vasculitis.  Exam: Vitals:   11/26/19 2300 11/27/19 0534  BP:  104/62  Pulse:  (!) 51  Resp:  18  Temp: 97.9 F (36.6 C) 97.9 F (36.6 C)  SpO2:  97%    ROS General ROS: negative for - chills, fatigue, fever, night sweats, weight gain or weight loss Psychological ROS: negative for - behavioral disorder, hallucinations, memory difficulties, mood swings or suicidal ideation Ophthalmic ROS: Positive for -positive for floaters ENT ROS: negative for - epistaxis, nasal discharge, oral lesions, sore throat, tinnitus or vertigo Allergy and Immunology ROS: negative for - hives or itchy/watery eyes Hematological and Lymphatic ROS: negative for - bleeding problems, bruising or swollen lymph nodes Endocrine ROS: negative for - galactorrhea, hair pattern changes, polydipsia/polyuria or temperature intolerance Respiratory ROS: negative for - cough, hemoptysis, shortness of breath or wheezing Cardiovascular ROS: negative for - chest pain, dyspnea on exertion, edema or irregular heartbeat Gastrointestinal ROS: negative for - abdominal pain, diarrhea, hematemesis, nausea/vomiting or stool incontinence Genito-Urinary ROS: negative for - dysuria, hematuria, incontinence or urinary frequency/urgency Musculoskeletal ROS: negative for - joint swelling or muscular weakness Neurological ROS: as noted in HPI Dermatological ROS: negative for rash and skin lesion changes    Physical Exam  Constitutional: Appears well-developed and well-nourished.  Psych: Affect appropriate to situation Eyes: No scleral injection HENT: No OP obstrucion Head: Normocephalic.  Cardiovascular: Normal rate and regular rhythm.  Respiratory: Effort normal, non-labored breathing GI: Soft.  No distension. There is no tenderness.  Skin: WDI   Neuro:   Mental Status: Alert, oriented, thought content appropriate.  Speech fluent without evidence of aphasia.  Able to follow 3 step commands without difficulty. Cranial Nerves: II:  Visual fields grossly normal, PERRL III,IV, VI: ptosis not present, EOMI V,VII: smile symmetric, facial light touch sensation normal bilaterally VIII: hearing normal bilaterally IX,X: Palate rises midline XI: bilateral shoulder shrug XII: midline tongue extension Motor: Right : Upper extremity   5/5    Left:     Upper extremity   5/5  Lower extremity   5/5     Lower extremity   5/5 Tone and bulk:normal tone throughout; no atrophy noted Sensory: Pinprick and light touch intact throughout, bilaterally Deep Tendon Reflexes: 2+ and symmetric throughout  Medications:  Scheduled: . Chlorhexidine Gluconate Cloth  6 each Topical Daily  . feeding supplement (ENSURE ENLIVE)  237 mL Oral TID BM  . linezolid  600 mg Oral Q12H  . metoCLOPramide (REGLAN) injection  5 mg Intravenous Q8H  . multivitamin with minerals  1 tablet Oral Daily  . pantoprazole (PROTONIX) IV  40 mg Intravenous Q24H  . sodium chloride flush  3 mL Intravenous Once  . sodium chloride flush  3 mL Intravenous Q12H    Pertinent Labs/Diagnostics: -ESR -C-reactive protein -ANCA  Etta Quill PA-C [facility] [phone]  Assessment:39 year old female re-presentingto the hospital while beingtreatedwith IV ABX at homeforpossiblebacterial meningitisdiagnosedat outlying facility. Patient was experiencing fluctuatingsevere headache,which has now resolved after initiation of pulsed-dose steroids. 1.MRI was obtained which did showa fewfoci of FLAIR abnormality of uncertain age. Radiology report documents overlying subtle meningeal enhancement, which appears less consistent with an infectious etiology, but compatible with a microangiopathic vasculitic process.  2. 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 She is  at increased risk for a vasculitis given her history of rheumatoid arthritis.   3.In discussing with patient, the description of the headaches does not fit perfectly with post LP headache. Migraine also unlikely given new-onset and essentially unremitting waxing/waning course of the headaches for the past 2 weeks. 4. Discussion has been made with patient about vasculitis and treatment with pulsed-dose steroids. Thus far she has had 2 doses which has resulted in resolution of headache. 5. Patient also complains of floaters.  This symptom could be secondary to vasculitic process involving the retina. To further evaluate, she will need to be scheduled for fluorescein angiography with a retinal specialist shortly after discharge.   Recommendations: - CNS vasculitis work up to include lupus panel, ESR and C-reactive protein as well as lab assessment for possible hepatitis C. For possible ANCA associated CNS vasculitisANCA immunoassays (MPO- and PR3- ANCA testing)can be obtained.  - Continue 5 days of pulsed-dose steroids. Today will be her third dose.  - At this point I do not believe repeat LP is needed as all previous documents from [facility] have been obtained - Agree with obtaining TEE.  - Obtain repeat urinalysis to compare to previous urinalysis done at [facility] - She will need to be scheduled for fluorescein angiography with a retinal specialist shortly after discharge.   Electronically signed: Dr. Kerney Elbe 11/27/2019, 8:52 AM

## 2019-11-27 NOTE — Transfer of Care (Signed)
Immediate Anesthesia Transfer of Care Note  Patient: Dawn [name]  Procedure(s) Performed: TRANSESOPHAGEAL ECHOCARDIOGRAM (TEE) (N/A ) BUBBLE STUDY  Patient Location: Endoscopy Unit  Anesthesia Type:MAC  Level of Consciousness: alert , oriented and patient cooperative  Airway & Oxygen Therapy: Patient Spontanous Breathing and Patient connected to nasal cannula oxygen  Post-op Assessment: Report given to RN and Post -op Vital signs reviewed and stable  Post vital signs: Reviewed and stable  Last Vitals:  Vitals Value Taken Time  BP    Temp    Pulse    Resp    SpO2      Last Pain:  Vitals:   11/27/19 1022  TempSrc: Oral  PainSc: 2       Patients Stated Pain Goal: 2 (16/94/50 3888)  Complications: No apparent anesthesia complications

## 2019-11-27 NOTE — Progress Notes (Signed)
Subjective:  Headaches resolved and fevers resolved.  Antibiotics:  Anti-infectives (From admission, onward)   Start     Dose/Rate Route Frequency Ordered Stop   11/24/19 2000  linezolid (ZYVOX) tablet 600 mg     600 mg Oral Every 12 hours 11/24/19 1109     11/21/19 0000  cefTRIAXone (ROCEPHIN) IVPB  Status:  Discontinued     2 g Intravenous Every 12 hours 11/21/19 1839 11/25/19    11/19/19 2000  linezolid (ZYVOX) IVPB 600 mg  Status:  Discontinued     600 mg 300 mL/hr over 60 Minutes Intravenous Every 12 hours 11/19/19 1556 11/24/19 1109   11/19/19 1600  vancomycin (VANCOREADY) IVPB 1250 mg/250 mL  Status:  Discontinued     1,250 mg 166.7 mL/hr over 90 Minutes Intravenous Every 8 hours 11/19/19 0548 11/19/19 1556   11/19/19 1400  cefTRIAXone (ROCEPHIN) 2 g in sodium chloride 0.9 % 100 mL IVPB  Status:  Discontinued     2 g 200 mL/hr over 30 Minutes Intravenous Every 12 hours 11/19/19 0530 11/25/19 1042   11/19/19 0230  linezolid (ZYVOX) IVPB 600 mg     600 mg 300 mL/hr over 60 Minutes Intravenous  Once 11/19/19 0201 11/19/19 0552   11/19/19 0215  cefTRIAXone (ROCEPHIN) 2 g in sodium chloride 0.9 % 100 mL IVPB     2 g 200 mL/hr over 30 Minutes Intravenous  Once 11/19/19 0201 11/19/19 0305      Medications: Scheduled Meds: . Chlorhexidine Gluconate Cloth  6 each Topical Daily  . feeding supplement (ENSURE ENLIVE)  237 mL Oral TID BM  . linezolid  600 mg Oral Q12H  . metoCLOPramide (REGLAN) injection  5 mg Intravenous Q8H  . multivitamin with minerals  1 tablet Oral Daily  . pantoprazole (PROTONIX) IV  40 mg Intravenous Q24H  . sodium chloride flush  3 mL Intravenous Once  . sodium chloride flush  3 mL Intravenous Q12H   Continuous Infusions: . sodium chloride 20 mL/hr at 11/27/19 0916  . sodium chloride    . methylPREDNISolone (SOLU-MEDROL) injection 1,000 mg (11/27/19 0918)   PRN Meds:.acetaminophen, gadobutrol, heparin lock flush, HYDROcodone-acetaminophen,  ondansetron (ZOFRAN) IV, promethazine, simethicone, sodium chloride flush, SUMAtriptan    Objective: Weight change:   Intake/Output Summary (Last 24 hours) at 11/27/2019 1443 Last data filed at 11/27/2019 1326 Gross per 24 hour  Intake 2967.75 ml  Output 850 ml  Net 2117.75 ml   Blood pressure 126/79, pulse (!) 48, temperature (!) 97.5 F (36.4 C), temperature source Axillary, resp. rate 17, height 5\' 6"  (1.676 m), weight 61.8 kg, SpO2 99 %, unknown if currently breastfeeding. Temp:  [97.5 F (36.4 C)-98.3 F (36.8 C)] 97.5 F (36.4 C) (02/04 1129) Pulse Rate:  [48-65] 48 (02/04 1146) Resp:  [17-23] 17 (02/04 1146) BP: (97-126)/(48-79) 126/79 (02/04 1146) SpO2:  [96 %-100 %] 99 % (02/04 1146) Weight:  [61.8 kg] 61.8 kg (02/04 1022)  Physical Exam: General: Alert and awake, oriented x3, not in any acute distress  HEENT: anicteric sclera, EOMI CVS regular rate, normal  Chest: , no wheezing, no respiratory distress Abdomen: soft non-distended,  Extremities: no edema or deformity noted bilaterally Skin: no rashes Neuro: nonfocal  CBC:    BMET Recent Labs    11/27/19 0750  NA 139  K 3.8  CL 106  CO2 25  GLUCOSE 140*  BUN 17  CREATININE 0.46  CALCIUM 8.3*     Liver Panel  No  results for input(s): PROT, ALBUMIN, AST, ALT, ALKPHOS, BILITOT, BILIDIR, IBILI in the last 72 hours.     Sedimentation Rate No results for input(s): ESRSEDRATE in the last 72 hours. C-Reactive Protein No results for input(s): CRP in the last 72 hours.  Micro Results: Recent Results (from the past 720 hour(s))  SARS CORONAVIRUS 2 (TAT 6-24 HRS) Nasopharyngeal Nasopharyngeal Swab     Status: None   Collection Time: 11/19/19  6:01 AM   Specimen: Nasopharyngeal Swab  Result Value Ref Range Status   SARS Coronavirus 2 NEGATIVE NEGATIVE Final    Comment: (NOTE) SARS-CoV-2 target nucleic acids are NOT DETECTED. The SARS-CoV-2 RNA is generally detectable in upper and lower respiratory  specimens during the acute phase of infection. Negative results do not preclude SARS-CoV-2 infection, do not rule out co-infections with other pathogens, and should not be used as the sole basis for treatment or other patient management decisions. Negative results must be combined with clinical observations, patient history, and epidemiological information. The expected result is Negative. Fact Sheet for Patients: HairSlick.no Fact Sheet for Healthcare Providers: quierodirigir.com This test is not yet approved or cleared by the [place] FDA and  has been authorized for detection and/or diagnosis of SARS-CoV-2 by FDA under an Emergency Use Authorization (EUA). This EUA will remain  in effect (meaning this test can be used) for the duration of the COVID-19 declaration under Section 56 4(b)(1) of the Act, 21 U.S.C. section 360bbb-3(b)(1), unless the authorization is terminated or revoked sooner. Performed at [facility], [address], [place], [place] [address]   Culture, blood (routine x 2)     Status: None   Collection Time: 11/19/19  5:15 PM   Specimen: BLOOD RIGHT ARM  Result Value Ref Range Status   Specimen Description BLOOD RIGHT ARM  Final   Special Requests   Final    BOTTLES DRAWN AEROBIC AND ANAEROBIC Blood Culture results may not be optimal due to an inadequate volume of blood received in culture bottles   Culture   Final    NO GROWTH 5 DAYS Performed at [facility], [address], [place], [place] [address]    Report Status 11/24/2019 FINAL  Final  Culture, blood (routine x 2)     Status: None   Collection Time: 11/19/19  5:20 PM   Specimen: BLOOD RIGHT HAND  Result Value Ref Range Status   Specimen Description BLOOD RIGHT HAND  Final   Special Requests   Final    AEROBIC BOTTLE ONLY Blood Culture results may not be optimal due to an inadequate volume of blood received in culture bottles    Culture   Final    NO GROWTH 5 DAYS Performed at [facility], [address], [place], [place] [address]    Report Status 11/24/2019 FINAL  Final    Studies/Results: No results found.    Assessment/Plan:  INTERVAL HISTORY: TEE without vegetations.  Principal Problem:   Aseptic meningitis Active Problems:   Rheumatoid arthritis with rheumatoid factor of multiple sites without organ or systems involvement (HCC)   Syncope    Dawn [name] is a 39 y.o. female with rheumatoid arthritis who presented with what is an apparent aseptic meningitis.  She has been getting linezolid along with ceftriaxone for the possibility that she has a bacterial infection though cultures were sterile from outside hospital.  MRI of the brain showed  multiple areas of hyperintensity that radiology feels consistent with meningitis though  they also raise ? Of hospital area of demyelination.  She has now been formally seen by neurology who are initiating autoimmune work-up.  I discontinued her ceftriaxone and keep her on Zyvox alone for now  While she did have a high fever 2days agoa was prior to initiating high-dose corticosteroids and since then she has had improvement and now dilution of her headaches and fevers.  Her TEE is negative  To new corticosteroid therapy per neurology and work-up for autoimmune pathology.  A 2-week course of total antimicrobial therapy with oral Zyvox with 6 more days of therapy  I will sign off for now please call further questions.    LOS: 8 days   Acey Lav 11/27/2019, 2:43 PM

## 2019-11-27 NOTE — Interval H&P Note (Signed)
History and Physical Interval Note:  11/27/2019 10:54 AM  Dawn [name]  has presented today for surgery, with the diagnosis of BACTEREMIA.  The various methods of treatment have been discussed with the patient and family. After consideration of risks, benefits and other options for treatment, the patient has consented to  Procedure(s): TRANSESOPHAGEAL ECHOCARDIOGRAM (TEE) (N/A) as a surgical intervention.  The patient's history has been reviewed, patient examined, no change in status, stable for surgery.  I have reviewed the patient's chart and labs.  Questions were answered to the patient's satisfaction.     Dietrich Pates

## 2019-11-27 NOTE — CV Procedure (Signed)
TEE  Consent for TEE obtained Patients throat numbed with viscous lidocaine Bite guard placed  Pt sedated by anesthesia with Propofol intravenously  TEE probe advanced to mid esophagus without difficulty  MV normal  Trace MR TV normal  No significant TR AV normal  No AI PV normal  Trivial PI  LVEF and RVEF normal  LA, LAA without masses NO PFO as tested by injection of agitated saline or by color doppler   Aorta is normal    Full report to follow   Dietrich Pates MD

## 2019-11-27 NOTE — Progress Notes (Signed)
PROGRESS NOTE    Dawn [name]   ERX:[id number]  DOB: 06/09/1981  DOA: 11/18/2019 PCP: [facility]   Brief Narrative:  Dawn [name] 39 y.o.femalewith medical history significant forrheumatoid arthritis on Orencia who was recently admitted at [facility] for suspected meningitis and discharged on empiric Rocephin, oral Zyvoxx. After getting home, she passed out and returned to our ER with ongoing complaints of headaches, nausea, vomiting and weakness.  Subjective: Headache and nausea are much better. She still has floaters. Awaiting TEE.   Assessment & Plan:   Principal Problem:   Meningitis? ongoing headaches, nausea, vomiting, fevers, syncope - she has been on antibiotics per ID but not improving - MRI > reveals numerous lesions- Neuro consulted for more input- I spoke with Dr Otelia Limes who has a suspicion for vasculitis - doing further work up- starting high dose steroids to treat for possible vasculitis - fever 102 on 2/2 - she could not go for TEE as she felt bad - much improved except for visual symptoms- await TEE and further recommendations from Neuro- I spoke with Dr Otelia Limes yesterday and today.   Active Problems: Vomiting - related to above- cont antiemetics and cont PPI for GI protection while on high dose steroids - d/c IVF after she has her TEE and is tolerating food and drink- she vomited yesterday but states nausea is better today    Rheumatoid arthritis  - receiving Orencia as oupt    Syncope - ? Vasovagal - may have been dehydrated  Time spent in minutes: 35 DVT prophylaxis: SCDs Code Status: Full code Family Communication:  Disposition Plan: cont treatment per Neuro and ID recommendations- cont IVF due to excess sweating and poor oral intake Consultants:   ID  Neuro Procedures:     Antimicrobials:  Anti-infectives (From admission, onward)   Start     Dose/Rate Route Frequency Ordered Stop   11/24/19 2000  [MAR Hold]  linezolid  (ZYVOX) tablet 600 mg     (MAR Hold since Thu 11/27/2019 at 1019.Hold Reason: Transfer to a Procedural area.)   600 mg Oral Every 12 hours 11/24/19 1109     11/21/19 0000  cefTRIAXone (ROCEPHIN) IVPB  Status:  Discontinued     2 g Intravenous Every 12 hours 11/21/19 1839 11/25/19    11/19/19 2000  linezolid (ZYVOX) IVPB 600 mg  Status:  Discontinued     600 mg 300 mL/hr over 60 Minutes Intravenous Every 12 hours 11/19/19 1556 11/24/19 1109   11/19/19 1600  vancomycin (VANCOREADY) IVPB 1250 mg/250 mL  Status:  Discontinued     1,250 mg 166.7 mL/hr over 90 Minutes Intravenous Every 8 hours 11/19/19 0548 11/19/19 1556   11/19/19 1400  cefTRIAXone (ROCEPHIN) 2 g in sodium chloride 0.9 % 100 mL IVPB  Status:  Discontinued     2 g 200 mL/hr over 30 Minutes Intravenous Every 12 hours 11/19/19 0530 11/25/19 1042   11/19/19 0230  linezolid (ZYVOX) IVPB 600 mg     600 mg 300 mL/hr over 60 Minutes Intravenous  Once 11/19/19 0201 11/19/19 0552   11/19/19 0215  cefTRIAXone (ROCEPHIN) 2 g in sodium chloride 0.9 % 100 mL IVPB     2 g 200 mL/hr over 30 Minutes Intravenous  Once 11/19/19 0201 11/19/19 0305       Objective: Vitals:   11/26/19 2300 11/27/19 0534 11/27/19 0928 11/27/19 1022  BP:  104/62 (!) 97/59 116/61  Pulse:  (!) 51 (!) 49 (!) 56  Resp:  18  18 19  Temp: 97.9 F (36.6 C) 97.9 F (36.6 C) 98.3 F (36.8 C) 97.9 F (36.6 C)  TempSrc: Oral  Oral Oral  SpO2:  97% 98% 100%  Weight:    61.8 kg  Height:    5\' 6"  (1.676 m)    Intake/Output Summary (Last 24 hours) at 11/27/2019 1052 Last data filed at 11/27/2019 0915 Gross per 24 hour  Intake 2705.21 ml  Output 1500 ml  Net 1205.21 ml   Filed Weights   11/23/19 2138 11/24/19 2120 11/27/19 1022  Weight: 60.8 kg 61.8 kg 61.8 kg    Examination: General exam: Appears comfortable  HEENT: PERRLA, oral mucosa moist, no sclera icterus or thrush Respiratory system: Clear to auscultation. Respiratory effort normal. Cardiovascular system:  S1 & S2 heard,  No murmurs  Gastrointestinal system: Abdomen soft, non-tender, nondistended. Normal bowel sounds   Central nervous system: Alert and oriented. No focal neurological deficits. Extremities: No cyanosis, clubbing or edema Skin: No rashes or ulcers Psychiatry:  Mood & affect appropriate.     Data Reviewed: I have personally reviewed following labs and imaging studies  CBC: Recent Labs  Lab 11/21/19 0819 11/22/19 0815 11/23/19 0340 11/24/19 0411 11/27/19 0750  WBC 9.3 10.8* 6.5 6.7 8.3  NEUTROABS  --   --  5.5 4.1  --   HGB 9.8* 10.2* 10.5* 9.6* 10.2*  HCT 29.5* 31.3* 32.1* 29.4* 31.1*  MCV 81.9 84.6 83.8 84.5 84.3  PLT 268 275 286 265 249   Basic Metabolic Panel: Recent Labs  Lab 11/21/19 0819 11/22/19 0815 11/23/19 0340 11/24/19 0411 11/27/19 0750  NA 138 133* 136 139 139  K 3.2* 3.2* 3.7 3.1* 3.8  CL 107 101 102 105 106  CO2 23 23 24 25 25   GLUCOSE 111* 116* 129* 101* 140*  BUN <5* <5* 5* 5* 17  CREATININE 0.45 0.51 0.42* 0.43* 0.46  CALCIUM 8.0* 7.8* 8.0* 7.9* 8.3*   GFR: Estimated Creatinine Clearance: 87.5 mL/min (by C-G formula based on SCr of 0.46 mg/dL). Liver Function Tests: No results for input(s): AST, ALT, ALKPHOS, BILITOT, PROT, ALBUMIN in the last 168 hours. No results for input(s): LIPASE, AMYLASE in the last 168 hours. No results for input(s): AMMONIA in the last 168 hours. Coagulation Profile: No results for input(s): INR, PROTIME in the last 168 hours. Cardiac Enzymes: No results for input(s): CKTOTAL, CKMB, CKMBINDEX, TROPONINI in the last 168 hours. BNP (last 3 results) No results for input(s): PROBNP in the last 8760 hours. HbA1C: No results for input(s): HGBA1C in the last 72 hours. CBG: No results for input(s): GLUCAP in the last 168 hours. Lipid Profile: No results for input(s): CHOL, HDL, LDLCALC, TRIG, CHOLHDL, LDLDIRECT in the last 72 hours. Thyroid Function Tests: No results for input(s): TSH, T4TOTAL, FREET4,  T3FREE, THYROIDAB in the last 72 hours. Anemia Panel: No results for input(s): VITAMINB12, FOLATE, FERRITIN, TIBC, IRON, RETICCTPCT in the last 72 hours. Urine analysis:    Component Value Date/Time   COLORURINE YELLOW 11/18/2019 2206   APPEARANCEUR CLOUDY (A) 11/18/2019 2206   LABSPEC 1.028 11/18/2019 2206   PHURINE 7.0 11/18/2019 2206   GLUCOSEU NEGATIVE 11/18/2019 2206   HGBUR SMALL (A) 11/18/2019 2206   BILIRUBINUR NEGATIVE 11/18/2019 2206   KETONESUR NEGATIVE 11/18/2019 2206   PROTEINUR NEGATIVE 11/18/2019 2206   NITRITE NEGATIVE 11/18/2019 2206   LEUKOCYTESUR NEGATIVE 11/18/2019 2206   Sepsis Labs: @LABRCNTIP (procalcitonin:4,lacticidven:4) ) Recent Results (from the past 240 hour(s))  SARS CORONAVIRUS 2 (TAT 6-24 HRS) Nasopharyngeal  Nasopharyngeal Swab     Status: None   Collection Time: 11/19/19  6:01 AM   Specimen: Nasopharyngeal Swab  Result Value Ref Range Status   SARS Coronavirus 2 NEGATIVE NEGATIVE Final    Comment: (NOTE) SARS-CoV-2 target nucleic acids are NOT DETECTED. The SARS-CoV-2 RNA is generally detectable in upper and lower respiratory specimens during the acute phase of infection. Negative results do not preclude SARS-CoV-2 infection, do not rule out co-infections with other pathogens, and should not be used as the sole basis for treatment or other patient management decisions. Negative results must be combined with clinical observations, patient history, and epidemiological information. The expected result is Negative. Fact Sheet for Patients: SugarRoll.be Fact Sheet for Healthcare Providers: https://www.woods-mathews.com/ This test is not yet approved or cleared by the [place] FDA and  has been authorized for detection and/or diagnosis of SARS-CoV-2 by FDA under an Emergency Use Authorization (EUA). This EUA will remain  in effect (meaning this test can be used) for the duration of the COVID-19  declaration under Section 56 4(b)(1) of the Act, 21 U.S.C. section 360bbb-3(b)(1), unless the authorization is terminated or revoked sooner. Performed at [facility], [place] [address], [place], [place] [address]   Culture, blood (routine x 2)     Status: None   Collection Time: 11/19/19  5:15 PM   Specimen: BLOOD RIGHT ARM  Result Value Ref Range Status   Specimen Description BLOOD RIGHT ARM  Final   Special Requests   Final    BOTTLES DRAWN AEROBIC AND ANAEROBIC Blood Culture results may not be optimal due to an inadequate volume of blood received in culture bottles   Culture   Final    NO GROWTH 5 DAYS Performed at [facility], [place] [address] [place], [place] [address]    Report Status 11/24/2019 FINAL  Final  Culture, blood (routine x 2)     Status: None   Collection Time: 11/19/19  5:20 PM   Specimen: BLOOD RIGHT HAND  Result Value Ref Range Status   Specimen Description BLOOD RIGHT HAND  Final   Special Requests   Final    AEROBIC BOTTLE ONLY Blood Culture results may not be optimal due to an inadequate volume of blood received in culture bottles   Culture   Final    NO GROWTH 5 DAYS Performed at [facility], [place] [address], [place], [place] [address]    Report Status 11/24/2019 FINAL  Final         Radiology Studies: No results found.    Scheduled Meds: . [MAR Hold] Chlorhexidine Gluconate Cloth  6 each Topical Daily  . [MAR Hold] feeding supplement (ENSURE ENLIVE)  237 mL Oral TID BM  . [MAR Hold] linezolid  600 mg Oral Q12H  . [MAR Hold] metoCLOPramide (REGLAN) injection  5 mg Intravenous Q8H  . [MAR Hold] multivitamin with minerals  1 tablet Oral Daily  . [MAR Hold] pantoprazole (PROTONIX) IV  40 mg Intravenous Q24H  . [MAR Hold] sodium chloride flush  3 mL Intravenous Once  . [MAR Hold] sodium chloride flush  3 mL Intravenous Q12H   Continuous Infusions: . sodium chloride 20 mL/hr at 11/27/19 0916  . [MAR Hold] methylPREDNISolone  (SOLU-MEDROL) injection 1,000 mg (11/27/19 0918)     LOS: 8 days      Debbe Odea, MD [facility] Pager: [email or web address] Password [facility] 11/27/2019, 10:52 AM

## 2019-11-27 NOTE — Progress Notes (Signed)
  Echocardiogram Echocardiogram Transesophageal has been performed.  Dawn [name] [name] 11/27/2019, 11:28 AM

## 2019-11-27 NOTE — Plan of Care (Signed)

## 2019-11-28 ENCOUNTER — Telehealth: Payer: Self-pay | Admitting: Rheumatology

## 2019-11-28 DIAGNOSIS — I776 Arteritis, unspecified: Secondary | ICD-10-CM

## 2019-11-28 MED ORDER — GENERIC EXTERNAL MEDICATION
Status: DC
Start: ? — End: 2019-11-28

## 2019-11-28 NOTE — Progress Notes (Addendum)
PROGRESS NOTE    Dawn [name]   XVQ:[id number]  DOB: 05-Jul-1981  DOA: 11/18/2019 PCP: [facility]   Brief Narrative:  Dawn [name] 39 y.o.femalewith medical history significant forrheumatoid arthritis on Orencia who was recently admitted at [facility] for suspected meningitis and discharged on empiric Rocephin, oral Zyvoxx. After getting home, she passed out and returned to our ER with ongoing complaints of headaches, nausea, vomiting and weakness.  Subjective: Headache and fevers resolved. Still has some nausea and is still seeing floaters.   Assessment & Plan:   Principal Problem:  Ongoing headaches, nausea, vomiting, fevers, syncope - she has been on antibiotics per ID for acute meningitis but was not improving- therefore, an MRI was performed - MRI > reveals numerous lesions- see full report below- Neuro consulted for more input- I spoke with Dr Otelia Limes who said her MRI was suspicion for vasculitis - doing further work up-  high dose steroids, 1 gm of Solumedrol daily started to treat for possible vasculitis - fever 102 on 2/2 which is when steroids were started- her fever has not recurred since then - Overall, the day after starting steroids, she was much improved except for visual symptoms-   - ID recommended a TEE and this was negative for endocarditis - at this point, ID recommends to continue Oral Zyvox to complete 2 full weeks of treatment and Neuro recommends f/u with outpt neuro after completing 5 days of high dose steroids in the hospital - she continues to see floaters and knows to f/u with ophthalmology as outpt  Active Problems: Vomiting - related to above- cont antiemetics and cont PPI for GI protection while on high dose steroids - d/c IVF after she has her TEE and is tolerating food and drink- she vomited yesterday but states nausea is better today    Rheumatoid arthritis  - receiving Orencia as oupt    Syncope - ? Vasovagal - may have  been dehydrated  Time spent in minutes: 35 DVT prophylaxis: SCDs Code Status: Full code Family Communication:  Disposition Plan: cont treatment per Neuro and ID recommendations- d/c to home tomorrow after last dose of IV steroids.  Consultants:   ID  Neuro Procedures:     Antimicrobials:  Anti-infectives (From admission, onward)   Start     Dose/Rate Route Frequency Ordered Stop   11/24/19 2000  linezolid (ZYVOX) tablet 600 mg     600 mg Oral Every 12 hours 11/24/19 1109 12/03/19 2359   11/21/19 0000  cefTRIAXone (ROCEPHIN) IVPB  Status:  Discontinued     2 g Intravenous Every 12 hours 11/21/19 1839 11/25/19    11/19/19 2000  linezolid (ZYVOX) IVPB 600 mg  Status:  Discontinued     600 mg 300 mL/hr over 60 Minutes Intravenous Every 12 hours 11/19/19 1556 11/24/19 1109   11/19/19 1600  vancomycin (VANCOREADY) IVPB 1250 mg/250 mL  Status:  Discontinued     1,250 mg 166.7 mL/hr over 90 Minutes Intravenous Every 8 hours 11/19/19 0548 11/19/19 1556   11/19/19 1400  cefTRIAXone (ROCEPHIN) 2 g in sodium chloride 0.9 % 100 mL IVPB  Status:  Discontinued     2 g 200 mL/hr over 30 Minutes Intravenous Every 12 hours 11/19/19 0530 11/25/19 1042   11/19/19 0230  linezolid (ZYVOX) IVPB 600 mg     600 mg 300 mL/hr over 60 Minutes Intravenous  Once 11/19/19 0201 11/19/19 0552   11/19/19 0215  cefTRIAXone (ROCEPHIN) 2 g in sodium chloride 0.9 %  100 mL IVPB     2 g 200 mL/hr over 30 Minutes Intravenous  Once 11/19/19 0201 11/19/19 0305       Objective: Vitals:   11/27/19 1146 11/27/19 2054 11/28/19 0459 11/28/19 0858  BP: 126/79 106/65 104/67 (!) 98/48  Pulse: (!) 48 70 (!) 52 72  Resp: 17 18 18 18   Temp:  98.4 F (36.9 C) 98.7 F (37.1 C) 97.8 F (36.6 C)  TempSrc:  Oral Oral Oral  SpO2: 99% 96% 94% 97%  Weight:      Height:        Intake/Output Summary (Last 24 hours) at 11/28/2019 1007 Last data filed at 11/28/2019 0600 Gross per 24 hour  Intake 1431.65 ml  Output 400 ml    Net 1031.65 ml   Filed Weights   11/23/19 2138 11/24/19 2120 11/27/19 1022  Weight: 60.8 kg 61.8 kg 61.8 kg    Examination: General exam: Appears comfortable  HEENT: PERRLA, oral mucosa moist, no sclera icterus or thrush Respiratory system: Clear to auscultation. Respiratory effort normal. Cardiovascular system: S1 & S2 heard,  No murmurs  Gastrointestinal system: Abdomen soft, non-tender, nondistended. Normal bowel sounds   Central nervous system: Alert and oriented. No focal neurological deficits. Extremities: No cyanosis, clubbing or edema Skin: No rashes or ulcers Psychiatry:  Mood & affect appropriate.     Data Reviewed: I have personally reviewed following labs and imaging studies  CBC: Recent Labs  Lab 11/22/19 0815 11/23/19 0340 11/24/19 0411 11/27/19 0750  WBC 10.8* 6.5 6.7 8.3  NEUTROABS  --  5.5 4.1  --   HGB 10.2* 10.5* 9.6* 10.2*  HCT 31.3* 32.1* 29.4* 31.1*  MCV 84.6 83.8 84.5 84.3  PLT 275 286 265 249   Basic Metabolic Panel: Recent Labs  Lab 11/22/19 0815 11/23/19 0340 11/24/19 0411 11/27/19 0750  NA 133* 136 139 139  K 3.2* 3.7 3.1* 3.8  CL 101 102 105 106  CO2 23 24 25 25   GLUCOSE 116* 129* 101* 140*  BUN <5* 5* 5* 17  CREATININE 0.51 0.42* 0.43* 0.46  CALCIUM 7.8* 8.0* 7.9* 8.3*   GFR: Estimated Creatinine Clearance: 87.5 mL/min (by C-G formula based on SCr of 0.46 mg/dL). Liver Function Tests: No results for input(s): AST, ALT, ALKPHOS, BILITOT, PROT, ALBUMIN in the last 168 hours. No results for input(s): LIPASE, AMYLASE in the last 168 hours. No results for input(s): AMMONIA in the last 168 hours. Coagulation Profile: No results for input(s): INR, PROTIME in the last 168 hours. Cardiac Enzymes: No results for input(s): CKTOTAL, CKMB, CKMBINDEX, TROPONINI in the last 168 hours. BNP (last 3 results) No results for input(s): PROBNP in the last 8760 hours. HbA1C: No results for input(s): HGBA1C in the last 72 hours. CBG: No  results for input(s): GLUCAP in the last 168 hours. Lipid Profile: No results for input(s): CHOL, HDL, LDLCALC, TRIG, CHOLHDL, LDLDIRECT in the last 72 hours. Thyroid Function Tests: No results for input(s): TSH, T4TOTAL, FREET4, T3FREE, THYROIDAB in the last 72 hours. Anemia Panel: No results for input(s): VITAMINB12, FOLATE, FERRITIN, TIBC, IRON, RETICCTPCT in the last 72 hours. Urine analysis:    Component Value Date/Time   COLORURINE YELLOW 11/18/2019 2206   APPEARANCEUR CLOUDY (A) 11/18/2019 2206   LABSPEC 1.028 11/18/2019 2206   PHURINE 7.0 11/18/2019 2206   GLUCOSEU NEGATIVE 11/18/2019 2206   HGBUR SMALL (A) 11/18/2019 2206   BILIRUBINUR NEGATIVE 11/18/2019 2206   KETONESUR NEGATIVE 11/18/2019 2206   PROTEINUR NEGATIVE 11/18/2019 2206  NITRITE NEGATIVE 11/18/2019 2206   LEUKOCYTESUR NEGATIVE 11/18/2019 2206   Sepsis Labs: @LABRCNTIP (procalcitonin:4,lacticidven:4) ) Recent Results (from the past 240 hour(s))  SARS CORONAVIRUS 2 (TAT 6-24 HRS) Nasopharyngeal Nasopharyngeal Swab     Status: None   Collection Time: 11/19/19  6:01 AM   Specimen: Nasopharyngeal Swab  Result Value Ref Range Status   SARS Coronavirus 2 NEGATIVE NEGATIVE Final    Comment: (NOTE) SARS-CoV-2 target nucleic acids are NOT DETECTED. The SARS-CoV-2 RNA is generally detectable in upper and lower respiratory specimens during the acute phase of infection. Negative results do not preclude SARS-CoV-2 infection, do not rule out co-infections with other pathogens, and should not be used as the sole basis for treatment or other patient management decisions. Negative results must be combined with clinical observations, patient history, and epidemiological information. The expected result is Negative. Fact Sheet for Patients: SugarRoll.be Fact Sheet for Healthcare Providers: https://www.woods-mathews.com/ This test is not yet approved or cleared by the [place]  FDA and  has been authorized for detection and/or diagnosis of SARS-CoV-2 by FDA under an Emergency Use Authorization (EUA). This EUA will remain  in effect (meaning this test can be used) for the duration of the COVID-19 declaration under Section 56 4(b)(1) of the Act, 21 U.S.C. section 360bbb-3(b)(1), unless the authorization is terminated or revoked sooner. Performed at [facility], [place] [address] [place], [place] [address]   Culture, blood (routine x 2)     Status: None   Collection Time: 11/19/19  5:15 PM   Specimen: BLOOD RIGHT ARM  Result Value Ref Range Status   Specimen Description BLOOD RIGHT ARM  Final   Special Requests   Final    BOTTLES DRAWN AEROBIC AND ANAEROBIC Blood Culture results may not be optimal due to an inadequate volume of blood received in culture bottles   Culture   Final    NO GROWTH 5 DAYS Performed at [facility], [place] [address], [place], [place] [address]    Report Status 11/24/2019 FINAL  Final  Culture, blood (routine x 2)     Status: None   Collection Time: 11/19/19  5:20 PM   Specimen: BLOOD RIGHT HAND  Result Value Ref Range Status   Specimen Description BLOOD RIGHT HAND  Final   Special Requests   Final    AEROBIC BOTTLE ONLY Blood Culture results may not be optimal due to an inadequate volume of blood received in culture bottles   Culture   Final    NO GROWTH 5 DAYS Performed at [facility], [place] [address] [place], [place] [address]    Report Status 11/24/2019 FINAL  Final         Radiology Studies: ECHO TEE  Result Date: 11/27/2019   TRANSESOPHOGEAL ECHO REPORT   Patient Name:   [facility] Date of Exam: 11/27/2019 Medical Rec #:  [id number]       Height:       66.0 in Accession #:    [id number]      Weight:       136.2 lb Date of Birth:  12-07-1980       BSA:          1.70 m Patient Age:    39 years        BP:           103/48 mmHg Patient Gender: F               HR:  53 bpm. Exam Location:  Inpatient   Procedure: Transesophageal Echo and Color Doppler Indications:     Endocarditis  History:         Patient has prior history of Echocardiogram examinations, most                  recent 11/19/2019. Aseptic meningitis.  Sonographer:     Leeroy Bock Turrentine Referring Phys:  [id number] CADENCE H FURTH Diagnosing Phys: Dietrich Pates MD  PROCEDURE: The transesophogeal probe was passed through the esophogus of the patient. The patient developed no complications during the procedure. IMPRESSIONS  1. Left ventricular ejection fraction, by visual estimation, is 60 to 65%. The left ventricle has normal function. There is no left ventricular hypertrophy.  2. Global right ventricle has normal systolic function.  3. The mitral valve is normal in structure. Trivial mitral valve regurgitation.  4. The tricuspid valve is normal in structure.  5. The tricuspid valve is normal in structure. Tricuspid valve regurgitation is not demonstrated.  6. The aortic valve is normal in structure. Aortic valve regurgitation is not visualized.  7. The pulmonic valve was normal in structure. Pulmonic valve regurgitation is trivial. FINDINGS  Left Ventricle: Left ventricular ejection fraction, by visual estimation, is 60 to 65%. The left ventricle has normal function. The left ventricle has no regional wall motion abnormalities. The left ventricular internal cavity size was the left ventricle is normal in size. There is no left ventricular hypertrophy. Right Ventricle: The right ventricular size is normal. Right vetricular wall thickness was not assessed. Global RV systolic function is has normal systolic function. Left Atrium: Left atrial size was normal in size. Right Atrium: Right atrial size was normal in size Pericardium: There is no evidence of pericardial effusion. Mitral Valve: The mitral valve is normal in structure. Trivial mitral valve regurgitation. Tricuspid Valve: The tricuspid valve is normal in structure. Tricuspid valve regurgitation is not  demonstrated. Aortic Valve: The aortic valve is normal in structure. Aortic valve regurgitation is not visualized. Pulmonic Valve: The pulmonic valve was normal in structure. Pulmonic valve regurgitation is trivial. Aorta: The aortic root is normal in size and structure. Shunts: Agitated saline contrast was [name] intravenously to evaluate for intracardiac shunting. Saline contrast bubble study was negative, with no evidence of any interatrial shunt. No atrial level shunt detected by color flow Doppler.  Dietrich Pates MD Electronically signed by Dietrich Pates MD Signature Date/Time: 11/27/2019/4:23:34 PM    Final       Scheduled Meds: . Chlorhexidine Gluconate Cloth  6 each Topical Daily  . feeding supplement (ENSURE ENLIVE)  237 mL Oral TID BM  . linezolid  600 mg Oral Q12H  . metoCLOPramide (REGLAN) injection  5 mg Intravenous Q8H  . multivitamin with minerals  1 tablet Oral Daily  . pantoprazole (PROTONIX) IV  40 mg Intravenous Q24H  . sodium chloride flush  3 mL Intravenous Once  . sodium chloride flush  3 mL Intravenous Q12H   Continuous Infusions: . sodium chloride 75 mL/hr at 11/27/19 2136  . sodium chloride    . methylPREDNISolone (SOLU-MEDROL) injection 1,000 mg (11/28/19 0847)     LOS: 9 days      Calvert Cantor, MD [facility] Pager: [email or web address] Password [email or web address] 11/28/2019, 10:07 AM

## 2019-11-28 NOTE — Telephone Encounter (Signed)
Referral placed and faxed to [phone] (provided by the referral/scheduling department at [facility]).   Spoke with Raquel and Administrator) to schedule and patient is scheduled with Starr Lake (rheumatology fellow) with Dr. Elijah Birk attending.   12/04/2019 at 12pm, patient to arrive by 11:30am.  [address] [facility] [place], [place] [address]  Dawn requested the discharge summary to be faxed at [phone]. Patient will be discharged today and will fax on Monday, 12/01/2019.  I spoke with patient and advised her of appointment details, patient verbalized understanding.

## 2019-11-28 NOTE — Telephone Encounter (Signed)
I returned patient's call after reviewing her chart.  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  She would like a second opinion at [facility].  She wants me to refer her to [facility] rheumatology, vasculitis clinic.  We will make an urgent referral for her.  Patient stated that she will be discharged today.

## 2019-11-28 NOTE — Telephone Encounter (Signed)
Patient called stating she is scheduled to be discharged from the hospital tomorrow and the doctors are reluctant to send her home with a prescription of Prednisone.  Patient states they told her she would need to follow-up with Dr. Corliss Skains.  Patient is requesting a return call.

## 2019-11-29 DIAGNOSIS — G4489 Other headache syndrome: Secondary | ICD-10-CM

## 2019-11-29 DIAGNOSIS — E860 Dehydration: Secondary | ICD-10-CM

## 2019-11-29 DIAGNOSIS — R2990 Unspecified symptoms and signs involving the nervous system: Secondary | ICD-10-CM

## 2019-11-29 MED ORDER — LINEZOLID 600 MG PO TABS: 600.0000 mg | ORAL_TABLET | Freq: Two times a day (BID) | ORAL | 0 refills | Status: AC

## 2019-11-29 NOTE — Progress Notes (Addendum)
NEURO HOSPITALIST PROGRESS NOTE   Subjective: Patient awake, alert, NAD. Family at bedside. Receiving final dose of steroid. Endorses that her HA improved with steroids.  Exam: Vitals:   11/28/19 2040 11/29/19 0509  BP: (!) 114/58 113/63  Pulse: (!) 49 (!) 48  Resp: 18 18  Temp: 98.6 F (37 C) 98.8 F (37.1 C)  SpO2: 95% 97%    Physical Exam  Constitutional: Appears well-developed and well-nourished.  Psych: Affect appropriate to situation Eyes: Normal external eye and conjunctiva. HENT: Normocephalic, no lesions, without obvious abnormality.   Musculoskeletal-no joint tenderness, deformity or swelling Cardiovascular: Normal rate and regular rhythm.  Respiratory: Effort normal, non-labored breathing saturations WNL GI: Soft.  No distension. There is no tenderness.  Skin: WDI  Neuro:  Mental Status: Alert, oriented, thought content appropriate.  Speech fluent without evidence of aphasia.  Able to follow  commands without difficulty. Cranial Nerves: II:  Visual fields grossly normal, PERRL III,IV, VI: ptosis not present, EOMI V,VII: smile symmetric, facial light touch sensation normal bilaterally VIII: hearing normal bilaterally IX,X: Palate rises symmetrically XI: bilateral shoulder shrug XII: midline tongue extension Motor: Right : Upper extremity   5/5  Left:     Upper extremity   5/5  Lower extremity   5/5   Lower extremity   5/5 Tone and bulk:normal tone throughout; no atrophy noted Sensory:  light touch intact throughout, bilaterally Deep Tendon Reflexes: 2+ and symmetric biceps, patella Plantars: Right: downgoing   Left: downgoing Cerebellar: No ataxia Gait: deferred    Medications:  Scheduled: . Chlorhexidine Gluconate Cloth  6 each Topical Daily  . feeding supplement (ENSURE ENLIVE)  237 mL Oral TID BM  . linezolid  600 mg Oral Q12H  . metoCLOPramide (REGLAN) injection  5 mg Intravenous Q8H  . multivitamin with minerals  1 tablet  Oral Daily  . pantoprazole (PROTONIX) IV  40 mg Intravenous Q24H  . sodium chloride flush  3 mL Intravenous Once  . sodium chloride flush  3 mL Intravenous Q12H   Continuous: . sodium chloride 75 mL/hr at 11/29/19 0110  . sodium chloride    . methylPREDNISolone (SOLU-MEDROL) injection 1,000 mg (11/28/19 0847)   ZCH:YIFOYDXAJOINO, gadobutrol, heparin lock flush, HYDROcodone-acetaminophen, ondansetron (ZOFRAN) IV, promethazine, simethicone, sodium chloride flush, SUMAtriptan  Pertinent Labs/Diagnostics:   ECHO TEE  Result Date: 11/27/2019   TRANSESOPHOGEAL ECHO REPORT   Patient Name:   [facility] Date of Exam: 11/27/2019 Medical Rec #:  [id number]       Height:       66.0 in Accession #:    [id number]      Weight:       136.2 lb Date of Birth:  Oct 18, 1981       BSA:          1.70 m Patient Age:    27 years        BP:           103/48 mmHg Patient Gender: F               HR:           53 bpm. Exam Location:  Inpatient  Procedure: Transesophageal Echo and Color Doppler Indications:     Endocarditis  History:         Patient has prior history of Echocardiogram examinations, most  recent 11/19/2019. Aseptic meningitis.  Sonographer:     Leeroy Bock Turrentine Referring Phys:  [id number] CADENCE H FURTH Diagnosing Phys: Dietrich Pates MD  PROCEDURE: The transesophogeal probe was passed through the esophogus of the patient. The patient developed no complications during the procedure. IMPRESSIONS  1. Left ventricular ejection fraction, by visual estimation, is 60 to 65%. The left ventricle has normal function. There is no left ventricular hypertrophy.  2. Global right ventricle has normal systolic function.  3. The mitral valve is normal in structure. Trivial mitral valve regurgitation.  4. The tricuspid valve is normal in structure.  5. The tricuspid valve is normal in structure. Tricuspid valve regurgitation is not demonstrated.  6. The aortic valve is normal in structure. Aortic valve regurgitation  is not visualized.  7. The pulmonic valve was normal in structure. Pulmonic valve regurgitation is trivial. FINDINGS  Left Ventricle: Left ventricular ejection fraction, by visual estimation, is 60 to 65%. The left ventricle has normal function. The left ventricle has no regional wall motion abnormalities. The left ventricular internal cavity size was the left ventricle is normal in size. There is no left ventricular hypertrophy. Right Ventricle: The right ventricular size is normal. Right vetricular wall thickness was not assessed. Global RV systolic function is has normal systolic function. Left Atrium: Left atrial size was normal in size. Right Atrium: Right atrial size was normal in size Pericardium: There is no evidence of pericardial effusion. Mitral Valve: The mitral valve is normal in structure. Trivial mitral valve regurgitation. Tricuspid Valve: The tricuspid valve is normal in structure. Tricuspid valve regurgitation is not demonstrated. Aortic Valve: The aortic valve is normal in structure. Aortic valve regurgitation is not visualized. Pulmonic Valve: The pulmonic valve was normal in structure. Pulmonic valve regurgitation is trivial. Aorta: The aortic root is normal in size and structure. Shunts: Agitated saline contrast was given intravenously to evaluate for intracardiac shunting. Saline contrast bubble study was negative, with no evidence of any interatrial shunt. No atrial level shunt detected by color flow Doppler.  Dietrich Pates MD Electronically signed by Dietrich Pates MD Signature Date/Time: 11/27/2019/4:23:34 PM    Final    Exam: Vitals:   11/28/19 2040 11/29/19 0509  BP: (!) 114/58 113/63  Pulse: (!) 49 (!) 48  Resp: 18 18  Temp: 98.6 F (37 C) 98.8 F (37.1 C)  SpO2: 95% 97%    Assessment:39 year old female re-presentingto the hospital while beingtreatedwith IV ABX at homeforpossiblebacterial meningitisdiagnosedat outlying facility. Patient was experiencing fluctuatingsevere  headache,which has now resolved after initiation of pulsed-dose steroids. 1.MRI was obtained which did showa fewfoci of FLAIR abnormality of uncertain age. Radiology report documents overlying subtle meningeal enhancement, which appears less consistent with an infectious etiology, but compatible with a microangiopathic vasculitic process.  2. 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 She is at increased risk for a vasculitis given her history of rheumatoid arthritis.   3.In discussing with patient, the description of the headaches does not fit perfectly with post LP headache. Migraine also unlikely given new-onset and essentially unremitting waxing/waning course of the headaches for the past 2 weeks. 4. Discussion has been made with patient about vasculitis and treatment with pulsed-dose steroids. Thus far she has had 2 doses which has resulted in resolution of headache. 5. Patient also complains of floaters.  This symptom could be secondary to vasculitic process involving the retina. To further evaluate, she will need to be scheduled for  fluorescein angiography with a retinal specialist shortly after discharge.  Recommendations: - 5 days of pulsed-dose steroids. Today will be her 5th dose. - She will need to be scheduled for fluorescein angiography with a retinal specialist shortly after discharge. -- Neurology to sign off at this time, please call with any further questions or concerns.   Electronically signed: Dr. Caryl Pina

## 2019-11-29 NOTE — Progress Notes (Signed)
DISCHARGE NOTE HOME Darthula Desa to be discharged Home per MD order. Discussed prescriptions and follow up appointments with the patient. Prescriptions given to patient; medication list explained in detail. Patient verbalized understanding.  Skin clean, dry and intact without evidence of skin break down, no evidence of skin tears noted. IV catheter discontinued intact. Site without signs and symptoms of complications. Dressing and pressure applied. Pt denies pain at the site currently. No complaints noted.  Patient free of lines, drains, and wounds.   An After Visit Summary (AVS) was printed and given to the patient. Patient escorted via wheelchair, and discharged home via private auto.  Leonia Reeves, RN

## 2019-11-29 NOTE — Discharge Summary (Signed)
Physician Discharge Summary  Dawn [name] WUJ:[id number] DOB: 09/26/1981 DOA: 11/18/2019  PCP: [facility], [place] date: 11/18/2019 Discharge date: 12/02/2019  Admitted From: home Disposition:  home   Recommendations for Outpatient Follow-up:  1. 4 more day remaining of Zyvox    Discharge Condition:  stable   CODE STATUS:  Full code   Diet recommendation:  Regular  Consultations:  Neurology  ID   Ophthalmology   Neurosurgery   Discharge Diagnoses:  Principal Problem:   Neurological symptoms Active Problems:   Rheumatoid arthritis [facility])   Syncope       Hospital Course:  Dawn [name] 39 y.o.femalewith medical history significant forrheumatoid arthritis on Orencia who was recently admitted at [facility] with headaches and treated for suspected meningitis and discharged on empiric Rocephin, oral Zyvox. After getting home, she had a syncopal spell and returned to our ER with ongoing complaints of headaches, floaters in her vision, nausea, vomiting and weakness.  Principal Problem:  Ongoing headaches, floaters, nausea, vomiting, fevers, syncope - she has been on antibiotics per ID for acute meningitis but was not improving- therefore, an MRI was performed - MRI > reveals numerous lesions- see full report below - Neuro consulted for more input - I spoke with Dr Cheral Marker who believes her MRI finding were suspicion for vasculitis -  high dose steroids, 1 gm of Solumedrol daily started to treat for possible vasculitis - fever 102 on 2/2 - her fever has not recurred since starting - Overall, the day after starting steroids, she was much improved except for her visual symptoms of seeing floaters   - ID recommended a TEE and this was negative for endocarditis - at this point, ID recommends to continue Oral Zyvox to complete 2 full weeks of treatment and Neuro recommends f/u with outpt neuro after completing 5 days of high dose steroids in the hospital  Active  problems:    Syncope - ? Vasovagal - may have been dehydrated Visual disturbances - evaluated by ophthalmology- normal exam - she continues to see floaters and knows to f/u with ophthalmology as outpt  Vomiting - related to above headaches and neurological changes - treated with, IVF,  antiemetics and PPI for GI protection while on high dose steroids      Rheumatoid arthritis  - receiving Orencia as oupt     Discharge Exam: Vitals:   11/29/19 0509 11/29/19 0935  BP: 113/63 111/70  Pulse: (!) 48 (!) 51  Resp: 18 18  Temp: 98.8 F (37.1 C) 97.9 F (36.6 C)  SpO2: 97% 98%   Vitals:   11/28/19 1656 11/28/19 2040 11/29/19 0509 11/29/19 0935  BP: 110/62 (!) 114/58 113/63 111/70  Pulse: 61 (!) 49 (!) 48 (!) 51  Resp: 18 18 18 18   Temp: 98.6 F (37 C) 98.6 F (37 C) 98.8 F (37.1 C) 97.9 F (36.6 C)  TempSrc: Oral Oral Oral Oral  SpO2: 97% 95% 97% 98%  Weight:      Height:        General: Pt is alert, awake, not in acute distress Cardiovascular: RRR, S1/S2 +, no rubs, no gallops Respiratory: CTA bilaterally, no wheezing, no rhonchi Abdominal: Soft, NT, ND, bowel sounds + Extremities: no edema, no cyanosis   Discharge Instructions  Discharge Instructions    Diet - low sodium heart healthy   Complete by: As directed    Diet general   Complete by: As directed    Increase activity slowly   Complete by:  As directed    Increase activity slowly   Complete by: As directed      Allergies as of 11/29/2019   No Known Allergies     Medication List    STOP taking these medications   cefTRIAXone 2 g injection Commonly known as: ROCEPHIN   ORENCIA IV     TAKE these medications   acetaminophen 325 MG tablet Commonly known as: TYLENOL Take 3 tablets (975 mg total) by mouth every 8 (eight) hours as needed for mild pain, fever or headache. Take no more than 4000mg  of Tylenol per day   linezolid 600 MG tablet Commonly known as: ZYVOX Take 1 tablet (600 mg  total) by mouth 2 (two) times daily.   ondansetron 4 MG tablet Commonly known as: Zofran Take 1 tablet (4 mg total) by mouth every 8 (eight) hours as needed for nausea or vomiting. Alternate with phenergan if needed   pantoprazole 40 MG tablet Commonly known as: PROTONIX Take 1 tablet (40 mg total) by mouth daily.   promethazine 12.5 MG tablet Commonly known as: PHENERGAN Take 1 tablet (12.5 mg total) by mouth every 8 (eight) hours as needed for nausea or vomiting. Alternate with Zofran if needed What changed:   when to take this  additional instructions   simethicone 80 MG chewable tablet Commonly known as: MYLICON Chew 1 tablet (80 mg total) by mouth 4 (four) times daily as needed (stomach gas/cramp).   SUMAtriptan 50 MG tablet Commonly known as: Imitrex Take 1 tablet (50 mg total) by mouth every 2 (two) hours as needed (persistent headache). May repeat in 2 hours if headache persists or recurs. Up to 200mg  in 24 hours.      Follow-up Information    , MD. Go to.   Specialty: Infectious Diseases Why: As previously scheduled. Call them if questions with the antibiotics.  Contact information: [address] [facility] [address] [facility] [place] [phone]        [id number], MD. Schedule an appointment as soon as possible for a visit in 2 week(s).   Specialty: Ophthalmology Why: Call and make an appointment with Opthalmology for 2 weeks from now.  Contact information: [address] [place] [address] [place] [phone]        Pc, [facility] Follow up in 1 week(s).   Contact information: [address] [place] [address] [place] [phone]          No Known Allergies   Procedures/Studies:    CT HEAD WO CONTRAST  Result Date: 11/19/2019 CLINICAL DATA:  Headache, intracranial hemorrhage suspected. EXAM: CT HEAD WITHOUT CONTRAST TECHNIQUE: Contiguous axial images were obtained from the base of  the skull through the vertex without intravenous contrast. COMPARISON:  No pertinent prior studies available for comparison. FINDINGS: Brain: No evidence of acute intracranial hemorrhage. No demarcated cortical infarction. No evidence of intracranial mass. No midline shift, extra-axial fluid collection or hydrocephalus. Cerebral volume is normal for age. Vascular: No hyperdense vessel. Skull: Normal. Negative for fracture or focal lesion. Sinuses/Orbits: Visualized orbits demonstrate no acute abnormality. Trace right maxillary sinus mucosal thickening. No significant mastoid effusion. IMPRESSION: Normal noncontrast CT appearance of the brain. No evidence of acute intracranial abnormality. Electronically Signed   By: [phone] DO   On: 11/19/2019 17:05   MR BRAIN W WO CONTRAST  Result Date: 11/24/2019 CLINICAL DATA:  Initial evaluation for meningitis, CNS infection, headache. EXAM: MRI HEAD WITHOUT AND WITH CONTRAST TECHNIQUE: Multiplanar, multiecho pulse sequences  of the brain and surrounding structures were obtained without and with intravenous contrast. CONTRAST:  79mL GADAVIST GADOBUTROL 1 MMOL/ML IV SOLN COMPARISON:  Prior head CT from 11/19/2019. FINDINGS: Brain: Cerebral volume within normal limits for age. There are a few scattered subcentimeter foci of FLAIR signal abnormality seen along several cortical sulci, most pronounced within the left frontal lobe (series 11, image 19, 18), and left parietal lobe (series 11, image 20). Subtle FLAIR signal intensity seen along several cortical sulci within the parietooccipital regions (series 11, image 13, 12). Following contrast administration, there is subtle leptomeningeal enhancement, most notable posteriorly. Constellation of findings most suggestive of meningitis, consistent with provided history. Note made of an additional 1 cm FLAIR lesion involving the right periatrial white matter (series 11, image 14), somewhat more indeterminate, but suspected to be  related underlying infection/meningitis as well, as this lesion demonstrates subtle postcontrast enhancement (series 19, image 10). Small capillary telangiectasia or possibly demyelinating lesion would be the primary differential considerations. No other focal parenchymal signal abnormality. No abnormal foci of restricted diffusion to suggest acute or subacute ischemia. Gray-white matter differentiation maintained. No encephalomalacia to suggest chronic cortical infarction. Minimal susceptibility artifact noted associated with the right periatrial lesion (series 14, image 29). No other evidence for acute or chronic intracranial hemorrhage. No mass lesion, midline shift or mass effect. No hydrocephalus. No visible intraventricular debris. Pituitary gland suprasellar region normal. Midline structures intact. Vascular: Major intracranial vascular flow voids are maintained. Skull and upper cervical spine: Craniocervical junction within normal limits. Upper cervical spine normal. Bone marrow signal intensity within normal limits. No scalp soft tissue abnormality. Sinuses/Orbits: Globes and orbital soft tissues demonstrate no acute finding. Paranasal sinuses are clear. No mastoid effusion. Inner ear structures grossly normal. Other: None. IMPRESSION: 1. Few scattered subcentimeter foci of FLAIR signal abnormality involving the juxtacortical white matter and cortical sulci of both cerebral hemispheres with associated subtle leptomeningeal enhancement. Constellation of findings suggestive of meningitis. 2. 1 cm FLAIR lesion involving the right periatrial white matter as above, somewhat more indeterminate, although suspected to be related to underlying infection/meningitis as well. Small capillary telangiectasia or demyelinating lesion would be the primary differential considerations. 3. Otherwise normal brain MRI. No other acute intracranial process identified. Electronically Signed   By: Rise Mu M.D.   On:  11/24/2019 04:36   DG Chest Portable 1 View  Result Date: 11/18/2019 CLINICAL DATA:  Infection, recent discharge for bacterial meningitis EXAM: PORTABLE CHEST 1 VIEW COMPARISON:  Radiograph 09/23/2015 FINDINGS: Left upper extremity PICC terminates at the mid SVC. No consolidation, features of edema, pneumothorax, or effusion. Pulmonary vascularity is normally distributed. The cardiomediastinal contours are unremarkable. No acute osseous or soft tissue abnormality. IMPRESSION: No acute cardiopulmonary abnormality. Electronically Signed   By: Kreg Shropshire M.D.   On: 11/18/2019 22:46   ECHOCARDIOGRAM COMPLETE  Result Date: 11/19/2019   ECHOCARDIOGRAM REPORT   Patient Name:   [facility] Date of Exam: 11/19/2019 Medical Rec #:  [id number]       Height:       66.0 in Accession #:    [id number]      Weight:       132.0 lb Date of Birth:  09/16/1981       BSA:          1.68 m Patient Age:    38 years        BP:           101/58 mmHg Patient Gender: F  HR:           48 bpm. Exam Location:  Inpatient Procedure: 2D Echo Indications:    Syncope 780.2 / R55  History:        Patient has no prior history of Echocardiogram examinations.                 Signs/Symptoms:Alzheimer's. Meningitis, headache, nausea.  Sonographer:    Leta Jungling RDCS Referring Phys: [id number] TIMOTHY S OPYD IMPRESSIONS  1. Left ventricular ejection fraction, by visual estimation, is 60 to 65%. The left ventricle has normal function. There is no left ventricular hypertrophy.  2. The left ventricle has no regional wall motion abnormalities.  3. Global right ventricle has normal systolic function.The right ventricular size is normal. No increase in right ventricular wall thickness.  4. Left atrial size was moderately dilated.  5. Right atrial size was normal.  6. Small pericardial effusion.  7. The mitral valve is normal in structure. Trivial mitral valve regurgitation.  8. The tricuspid valve is normal in structure.  9. The tricuspid  valve is normal in structure. Tricuspid valve regurgitation is trivial. 10. The aortic valve has an indeterminant number of cusps. Aortic valve regurgitation is not visualized. No evidence of aortic valve sclerosis or stenosis. 11. The pulmonic valve was grossly normal. Pulmonic valve regurgitation is not visualized. 12. Normal pulmonary artery systolic pressure. FINDINGS  Left Ventricle: Left ventricular ejection fraction, by visual estimation, is 60 to 65%. The left ventricle has normal function. The left ventricle has no regional wall motion abnormalities. There is no left ventricular hypertrophy. Left ventricular diastolic parameters were normal. Right Ventricle: The right ventricular size is normal. No increase in right ventricular wall thickness. Global RV systolic function is has normal systolic function. The tricuspid regurgitant velocity is 1.75 m/s, and with an assumed right atrial pressure  of 3 mmHg, the estimated right ventricular systolic pressure is normal at 15.2 mmHg. Left Atrium: Left atrial size was moderately dilated. Right Atrium: Right atrial size was normal in size Pericardium: A small pericardial effusion is present. Mitral Valve: The mitral valve is normal in structure. Trivial mitral valve regurgitation. Tricuspid Valve: The tricuspid valve is normal in structure. Tricuspid valve regurgitation is trivial. Aortic Valve: The aortic valve has an indeterminant number of cusps. Aortic valve regurgitation is not visualized. The aortic valve is structurally normal, with no evidence of sclerosis or stenosis. Pulmonic Valve: The pulmonic valve was grossly normal. Pulmonic valve regurgitation is not visualized. Pulmonic regurgitation is not visualized. Aorta: The aortic root, ascending aorta, aortic arch and descending aorta are all structurally normal, with no evidence of dilitation or obstruction. Pulmonary Artery: The pulmonary artery is not well seen. IAS/Shunts: No atrial level shunt detected by  color flow Doppler.  LEFT VENTRICLE PLAX 2D LVIDd:         5.00 cm  Diastology LVIDs:         3.30 cm  LV e' lateral:   12.90 cm/s LV PW:         0.60 cm  LV E/e' lateral: 8.7 LV IVS:        0.80 cm  LV e' medial:    13.50 cm/s LVOT diam:     1.80 cm  LV E/e' medial:  8.3 LV SV:         74 ml LV SV Index:   44.40 LVOT Area:     2.54 cm  RIGHT VENTRICLE RV S prime:     21.20 cm/s  TAPSE (M-mode): 3.2 cm LEFT ATRIUM           Index       RIGHT ATRIUM           Index LA diam:      3.40 cm 2.03 cm/m  RA Area:     11.50 cm LA Vol (A2C): 30.1 ml 17.96 ml/m RA Volume:   22.60 ml  13.48 ml/m LA Vol (A4C): 68.3 ml 40.75 ml/m  AORTIC VALVE LVOT Vmax:   124.00 cm/s LVOT Vmean:  84.000 cm/s LVOT VTI:    0.260 m  AORTA Ao Root diam: 2.80 cm Ao Asc diam:  2.80 cm MITRAL VALVE                         TRICUSPID VALVE MV Area (PHT): 3.91 cm              TR Peak grad:   12.2 mmHg MV PHT:        56.26 msec            TR Vmax:        175.00 cm/s MV Decel Time: 194 msec MV E velocity: 112.00 cm/s 103 cm/s  SHUNTS MV A velocity: 74.70 cm/s  70.3 cm/s Systemic VTI:  0.26 m MV E/A ratio:  1.50        1.5       Systemic Diam: 1.80 cm  Jodelle Red MD Electronically signed by Jodelle Red MD Signature Date/Time: 11/19/2019/1:25:39 PM    Final    ECHO TEE  Result Date: 11/27/2019   TRANSESOPHOGEAL ECHO REPORT   Patient Name:   [facility] Date of Exam: 11/27/2019 Medical Rec #:  [id number]       Height:       66.0 in Accession #:    [id number]      Weight:       136.2 lb Date of Birth:  11-17-1980       BSA:          1.70 m Patient Age:    39 years        BP:           103/48 mmHg Patient Gender: F               HR:           53 bpm. Exam Location:  Inpatient  Procedure: Transesophageal Echo and Color Doppler Indications:     Endocarditis  History:         Patient has prior history of Echocardiogram examinations, most                  recent 11/19/2019. Aseptic meningitis.  Sonographer:     Leeroy Bock Turrentine  Referring Phys:  [id number] CADENCE H FURTH Diagnosing Phys: Dietrich Pates MD  PROCEDURE: The transesophogeal probe was passed through the esophogus of the patient. The patient developed no complications during the procedure. IMPRESSIONS  1. Left ventricular ejection fraction, by visual estimation, is 60 to 65%. The left ventricle has normal function. There is no left ventricular hypertrophy.  2. Global right ventricle has normal systolic function.  3. The mitral valve is normal in structure. Trivial mitral valve regurgitation.  4. The tricuspid valve is normal in structure.  5. The tricuspid valve is normal in structure. Tricuspid valve regurgitation is not demonstrated.  6. The aortic valve is normal in structure. Aortic valve regurgitation is not visualized.  7.  The pulmonic valve was normal in structure. Pulmonic valve regurgitation is trivial. FINDINGS  Left Ventricle: Left ventricular ejection fraction, by visual estimation, is 60 to 65%. The left ventricle has normal function. The left ventricle has no regional wall motion abnormalities. The left ventricular internal cavity size was the left ventricle is normal in size. There is no left ventricular hypertrophy. Right Ventricle: The right ventricular size is normal. Right vetricular wall thickness was not assessed. Global RV systolic function is has normal systolic function. Left Atrium: Left atrial size was normal in size. Right Atrium: Right atrial size was normal in size Pericardium: There is no evidence of pericardial effusion. Mitral Valve: The mitral valve is normal in structure. Trivial mitral valve regurgitation. Tricuspid Valve: The tricuspid valve is normal in structure. Tricuspid valve regurgitation is not demonstrated. Aortic Valve: The aortic valve is normal in structure. Aortic valve regurgitation is not visualized. Pulmonic Valve: The pulmonic valve was normal in structure. Pulmonic valve regurgitation is trivial. Aorta: The aortic root is normal in  size and structure. Shunts: Agitated saline contrast was given intravenously to evaluate for intracardiac shunting. Saline contrast bubble study was negative, with no evidence of any interatrial shunt. No atrial level shunt detected by color flow Doppler.  Dietrich Pates MD Electronically signed by Dietrich Pates MD Signature Date/Time: 11/27/2019/4:23:34 PM    Final      The results of significant diagnostics from this hospitalization (including imaging, microbiology, ancillary and laboratory) are listed below for reference.     Microbiology: No results found for this or any previous visit (from the past 240 hour(s)).   Labs: BNP (last 3 results) No results for input(s): BNP in the last 8760 hours. Basic Metabolic Panel: Recent Labs  Lab 11/27/19 0750  NA 139  K 3.8  CL 106  CO2 25  GLUCOSE 140*  BUN 17  CREATININE 0.46  CALCIUM 8.3*   Liver Function Tests: No results for input(s): AST, ALT, ALKPHOS, BILITOT, PROT, ALBUMIN in the last 168 hours. No results for input(s): LIPASE, AMYLASE in the last 168 hours. No results for input(s): AMMONIA in the last 168 hours. CBC: Recent Labs  Lab 11/27/19 0750  WBC 8.3  HGB 10.2*  HCT 31.1*  MCV 84.3  PLT 249   Cardiac Enzymes: No results for input(s): CKTOTAL, CKMB, CKMBINDEX, TROPONINI in the last 168 hours. BNP: Invalid input(s): POCBNP CBG: No results for input(s): GLUCAP in the last 168 hours. D-Dimer No results for input(s): DDIMER in the last 72 hours. Hgb A1c No results for input(s): HGBA1C in the last 72 hours. Lipid Profile No results for input(s): CHOL, HDL, LDLCALC, TRIG, CHOLHDL, LDLDIRECT in the last 72 hours. Thyroid function studies No results for input(s): TSH, T4TOTAL, T3FREE, THYROIDAB in the last 72 hours.  Invalid input(s): FREET3 Anemia work up No results for input(s): VITAMINB12, FOLATE, FERRITIN, TIBC, IRON, RETICCTPCT in the last 72 hours. Urinalysis    Component Value Date/Time   COLORURINE YELLOW  11/18/2019 2206   APPEARANCEUR CLOUDY (A) 11/18/2019 2206   LABSPEC 1.028 11/18/2019 2206   PHURINE 7.0 11/18/2019 2206   GLUCOSEU NEGATIVE 11/18/2019 2206   HGBUR SMALL (A) 11/18/2019 2206   BILIRUBINUR NEGATIVE 11/18/2019 2206   KETONESUR NEGATIVE 11/18/2019 2206   PROTEINUR NEGATIVE 11/18/2019 2206   NITRITE NEGATIVE 11/18/2019 2206   LEUKOCYTESUR NEGATIVE 11/18/2019 2206   Sepsis Labs Invalid input(s): PROCALCITONIN,  WBC,  LACTICIDVEN Microbiology No results found for this or any previous visit (from the past 240 hour(s)).  Time coordinating discharge in minutes: 65  SIGNED:   Calvert Cantor, MD  [facility] 12/02/2019, 10:02 AM Pager   If 7PM-7AM, please contact night-coverage [email or web address] Password [email or web address]

## 2019-11-29 NOTE — Discharge Instructions (Signed)
You were cared for by a hospitalist during your hospital stay. If you have any questions about your discharge medications or the care you received while you were in the hospital after you are discharged, you can call the unit and asked to speak with the hospitalist on call if the hospitalist that took care of you is not available. Once you are discharged, your primary care physician will handle any further medical issues.   Please note that NO REFILLS for any discharge medications will be authorized once you are discharged, as it is imperative that you return to your primary care physician (or establish a relationship with a primary care physician if you do not have one) for your aftercare needs so that they can reassess your need for medications and monitor your lab values.  Please take all your medications with you for your next visit with your Primary MD. Please ask your Primary MD to get all Hospital records sent to his/her office. Please request your Primary MD to go over all hospital test results at the follow up.   If you experience worsening of your admission symptoms, develop shortness of breath, chest pain, suicidal or homicidal thoughts or a life threatening emergency, you must seek medical attention immediately by calling 911 or calling your MD.   Bonita Quin must read the complete instructions/literature along with all the possible adverse reactions/side effects for all the medicines you take including new medications that have been prescribed to you. Take new medicines after you have completely understood and accpet all the possible adverse reactions/side effects.    Do not drive when taking pain medications or sedatives.     Do not take more than prescribed Pain, Sleep and Anxiety Medications   If you have smoked or chewed Tobacco in the last 2 yrs please stop. Stop any regular alcohol  and or recreational drug use.   Wear Seat belts while driving.   Dehydration, Adult Dehydration is  condition in which there is not enough water or other fluids in the body. This happens when a person loses more fluids than he or she takes in. Important body parts cannot work right without the right amount of fluids. Any loss of fluids from the body can cause dehydration. Dehydration can be mild, worse, or very bad. It should be treated right away to keep it from getting very bad. What are the causes? This condition may be caused by:  Conditions that cause loss of water or other fluids, such as: ? Watery poop (diarrhea). ? Vomiting. ? Sweating a lot. ? Peeing (urinating) a lot.  Not drinking enough fluids, especially when you: ? Are ill. ? Are doing things that take a lot of energy to do.  Other illnesses and conditions, such as fever or infection.  Certain medicines, such as medicines that take extra fluid out of the body (diuretics).  Lack of safe drinking water.  Not being able to get enough water and food. What increases the risk? The following factors may make you more likely to develop this condition:  Having a long-term (chronic) illness that has not been treated the right way, such as: ? Diabetes. ? Heart disease. ? Kidney disease.  Being 39 years of age or older.  Having a disability.  Living in a place that is high above the ground or sea (high in altitude). The thinner, dried air causes more fluid loss.  Doing exercises that put stress on your body for a long time. What are the signs  or symptoms? Symptoms of dehydration depend on how bad it is. Mild or worse dehydration  Thirst.  Dry lips or dry mouth.  Feeling dizzy or light-headed, especially when you stand up from sitting.  Muscle cramps.  Your body making: ? Dark pee (urine). Pee may be the color of tea. ? Less pee than normal. ? Less tears than normal.  Headache. Very bad dehydration  Changes in skin. Skin may: ? Be cold to the touch (clammy). ? Be blotchy or pale. ? Not go back to normal  right after you lightly pinch it and let it go.  Little or no tears, pee, or sweat.  Changes in vital signs, such as: ? Fast breathing. ? Low blood pressure. ? Weak pulse. ? Pulse that is more than 100 beats a minute when you are sitting still.  Other changes, such as: ? Feeling very thirsty. ? Eyes that look hollow (sunken). ? Cold hands and feet. ? Being mixed up (confused). ? Being very tired (lethargic) or having trouble waking from sleep. ? Short-term weight loss. ? Loss of consciousness. How is this treated? Treatment for this condition depends on how bad it is. Treatment should start right away. Do not wait until your condition gets very bad. Very bad dehydration is an emergency. You will need to go to a hospital.  Mild or worse dehydration can be treated at home. You may be asked to: ? Drink more fluids. ? Drink an oral rehydration solution (ORS). This drink helps get the right amounts of fluids and salts and minerals in the blood (electrolytes).  Very bad dehydration can be treated: ? With fluids through an IV tube. ? By getting normal levels of salts and minerals in your blood. This is often done by giving salts and minerals through a tube. The tube is passed through your nose and into your stomach. ? By treating the root cause. Follow these instructions at home: Oral rehydration solution If told by your doctor, drink an ORS:  Make an ORS. Use instructions on the package.  Start by drinking small amounts, about  cup (120 mL) every 5-10 minutes.  Slowly drink more until you have had the amount that your doctor said to have. Eating and drinking         Drink enough clear fluid to keep your pee pale yellow. If you were told to drink an ORS, finish the ORS first. Then, start slowly drinking other clear fluids. Drink fluids such as: ? Water. Do not drink only water. Doing that can make the salt (sodium) level in your body get too low. ? Water from ice chips you  suck on. ? Fruit juice that you have added water to (diluted). ? Low-calorie sports drinks.  Eat foods that have the right amounts of salts and minerals, such as: ? Bananas. ? Oranges. ? Potatoes. ? Tomatoes. ? Spinach.  Do not drink alcohol.  Avoid: ? Drinks that have a lot of sugar. These include:  High-calorie sports drinks.  Fruit juice that you did not add water to.  Soda.  Caffeine. ? Foods that are greasy or have a lot of fat or sugar. General instructions  Take over-the-counter and prescription medicines only as told by your doctor.  Do not take salt tablets. Doing that can make the salt level in your body get too high.  Return to your normal activities as told by your doctor. Ask your doctor what activities are safe for you.  Keep all follow-up visits  as told by your doctor. This is important. Contact a doctor if:  You have pain in your belly (abdomen) and the pain: ? Gets worse. ? Stays in one place.  You have a rash.  You have a stiff neck.  You get angry or annoyed (irritable) more easily than normal.  You are more tired or have a harder time waking than normal.  You feel: ? Weak or dizzy. ? Very thirsty. Get help right away if you have:  Any symptoms of very bad dehydration.  Symptoms of vomiting, such as: ? You cannot eat or drink without vomiting. ? Your vomiting gets worse or does not go away. ? Your vomit has blood or green stuff in it.  Symptoms that get worse with treatment.  A fever.  A very bad headache.  Problems with peeing or pooping (having a bowel movement), such as: ? Watery poop that gets worse or does not go away. ? Blood in your poop (stool). This may cause poop to look black and tarry. ? Not peeing in 6-8 hours. ? Peeing only a small amount of very dark pee in 6-8 hours.  Trouble breathing. These symptoms may be an emergency. Do not wait to see if the symptoms will go away. Get medical help right away. Call your  local emergency services (911 in the [place] Do not drive yourself to the hospital. Summary  Dehydration is a condition in which there is not enough water or other fluids in the body. This happens when a person loses more fluids than he or she takes in.  Treatment for this condition depends on how bad it is. Treatment should be started right away. Do not wait until your condition gets very bad.  Drink enough clear fluid to keep your pee pale yellow. If you were told to drink an oral rehydration solution (ORS), finish the ORS first. Then, start slowly drinking other clear fluids.  Take over-the-counter and prescription medicines only as told by your doctor.  Get help right away if you have any symptoms of very bad dehydration. This information is not intended to replace advice given to you by your health care provider. Make sure you discuss any questions you have with your health care provider. Document Revised: 05/22/2019 Document Reviewed: 05/22/2019 Elsevier Patient Education  [place].

## 2019-11-29 NOTE — Care Management (Signed)
Spoke w [facility] pharmacist who states that Zyvox was filled within the last 30 days. Patient should have supply at home. No PA required.

## 2019-11-29 NOTE — Progress Notes (Signed)
Pt has a dual lumen picc in the left upper arm; both ports noted to be clotted off this morning; pt is on oral meds; just finished iv prednisone;  RN aware that picc is not working at this time; pt feels she may be discharged home today without the picc line;  Will hold TPA at this time and wait to see if picc line is to be removed .

## 2019-12-01 ENCOUNTER — Ambulatory Visit: Payer: BC Managed Care – PPO | Admitting: Internal Medicine

## 2019-12-01 ENCOUNTER — Telehealth: Payer: Self-pay | Admitting: Rheumatology

## 2019-12-01 MED ORDER — PREDNISONE 20 MG PO TABS: 60.0000 mg | ORAL_TABLET | Freq: Every day | ORAL | 0 refills | Status: AC

## 2019-12-01 NOTE — Telephone Encounter (Signed)
Patient's husband [name] called stating Dawn [name] was discharged on Saturday 11/29/2019.  [name] stated she did very well on Sunday, but this morning began running a low grade fever and the headaches are returning.  [name] is requesting to speak with Dr. Corliss Skains directly.  Brad's [phone]

## 2019-12-01 NOTE — Telephone Encounter (Signed)
[facility]nisone sent to [facility] in [place] (pharmacy provided by [name]).

## 2019-12-01 NOTE — Telephone Encounter (Signed)
I returned Brad's call.  He states the patient has developed fever again and having headaches.  She was discharged home on Friday after 5 days of IV Solu-Medrol.  She is not on any prednisone.  I also spoke with Dr. Marjory Lies after that and discussed current situation.  He was in agreement that there is no definite diagnosis made.  He was also in agreement that I should start her on prednisone.  I will place her on prednisone 60 mg p.o. daily until she is seen by [facility] rheumatology and neurologist here locally.  Please call in a prescription for prednisone 20 mg tablet 3 tablets p.o. every morning total 90 tablets.  We will discuss taper after discussing this further with [facility] rheumatology and neurology.

## 2019-12-01 NOTE — Telephone Encounter (Signed)
I checked status of discharge summary and it is incomplete. Unable to fax yet.

## 2019-12-01 NOTE — Telephone Encounter (Signed)
I discussed the above conversation with patient's husband.

## 2019-12-02 DIAGNOSIS — R2990 Unspecified symptoms and signs involving the nervous system: Secondary | ICD-10-CM

## 2019-12-02 NOTE — Telephone Encounter (Signed)
Dawn from [facility] called and the discharge summary is required for the appointment. It is still incomplete. I have attempted to contact the hospitalist at  [phone] and left a message for someone to call the office. I have also sent Dr. Butler Denmark a message in epic secure chat.   I have faxed the last note from hospital admission and the last note from Dr. Corliss Skains to [facility] at the number she provided.

## 2019-12-02 NOTE — Telephone Encounter (Signed)
I spoke with Dawn to advise the documents have been faxed.

## 2019-12-02 NOTE — Telephone Encounter (Signed)
Dr. Butler Denmark completed the discharge summary and I have faxed it to [facility] at [phone].

## 2019-12-05 MED ORDER — ENOXAPARIN SODIUM 40 MG/0.4ML ~~LOC~~ SOLN
40.00 | SUBCUTANEOUS | Status: DC
Start: 2019-12-10 — End: 2019-12-05

## 2019-12-05 MED ORDER — METHYLPREDNISOLONE SODIUM SUCC 2000 MG IJ SOLR
60.00 | INTRAMUSCULAR | Status: DC
Start: 2019-12-08 — End: 2019-12-05

## 2019-12-05 MED ORDER — ACETAMINOPHEN 325 MG PO TABS
650.00 | ORAL_TABLET | ORAL | Status: DC
Start: ? — End: 2019-12-05

## 2019-12-05 MED ORDER — DSS 100 MG PO CAPS
100.00 | ORAL_CAPSULE | ORAL | Status: DC
Start: ? — End: 2019-12-05

## 2019-12-05 MED ORDER — LIDOCAINE HCL 1 % IJ SOLN
0.50 | INTRAMUSCULAR | Status: DC
Start: ? — End: 2019-12-05

## 2019-12-05 NOTE — Telephone Encounter (Signed)
I called patient's husband today.  He stated that she was hospitalized yesterday at [facility] and is getting work-up.  She had ophthalmology evaluation today.  The results are pending

## 2019-12-08 MED ORDER — SIMETHICONE 80 MG PO CHEW
80.00 | CHEWABLE_TABLET | ORAL | Status: DC
Start: ? — End: 2019-12-08

## 2019-12-08 MED ORDER — NYSTATIN 100000 UNIT/ML MT SUSP
5.00 | OROMUCOSAL | Status: DC
Start: 2019-12-09 — End: 2019-12-08

## 2019-12-09 LAB — BLASTOMYCES ANTIGEN: Blastomyces Antigen: NOT DETECTED ng/mL

## 2019-12-11 MED ORDER — CALCIUM CARBONATE-VITAMIN D 600-400 MG-UNIT PO TABS
1.00 | ORAL_TABLET | ORAL | Status: DC
Start: 2019-12-10 — End: 2019-12-11

## 2019-12-11 MED ORDER — PANTOPRAZOLE SODIUM 40 MG PO TBEC
40.00 | DELAYED_RELEASE_TABLET | ORAL | Status: DC
Start: 2019-12-10 — End: 2019-12-11

## 2019-12-11 MED ORDER — GENERIC EXTERNAL MEDICATION
30.00 | Status: DC
Start: 2019-12-10 — End: 2019-12-11

## 2019-12-15 NOTE — Telephone Encounter (Signed)
I called to check on patient.  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  She has been followed by ophthalmologist, neurologist and has an appointment tomorrow with the rheumatologist.  She is currently on prednisone 50 mg p.o. daily.  She is feeling better.  She is not using any biologic agent currently.

## 2019-12-28 ENCOUNTER — Other Ambulatory Visit: Payer: Self-pay | Admitting: Rheumatology

## 2019-12-30 NOTE — Telephone Encounter (Signed)
Attempted to contact the patient and left message for patient to call the office. Patient is now being seen by [facility]. Calling to verify if patient is getting Prednisone refilled through them.

## 2020-01-01 NOTE — Telephone Encounter (Signed)
Attempted to contact the patient and left message for patient to call the office.  

## 2020-01-02 NOTE — Telephone Encounter (Signed)
Per Dr. Corliss Skains, okay to deny prescription.

## 2020-01-20 ENCOUNTER — Telehealth: Payer: Self-pay | Admitting: *Deleted

## 2020-01-20 NOTE — Telephone Encounter (Signed)
Per Freddi Starr, RN with [facility] they have not heard back from pt to get her scheduled for her next Orencia infusion. Last one around 10/2019. Has no f/u pending here. Last seen 11/2018. Liane wanted MD to be aware she was reviewin gchart and saw pt has been at [facility] for other issues. Wants to know if she should be scheduled for Orencia or should f/u here with Dr. Epimenio Foot first.

## 2020-01-22 NOTE — Telephone Encounter (Signed)
She has transferred her rheumatoligc care to [facility] and will be having medication changes so will not be needing infusions here

## 2020-01-26 NOTE — Telephone Encounter (Signed)
Noted, informed [facility].

## 2020-06-13 ENCOUNTER — Other Ambulatory Visit: Payer: Self-pay | Admitting: Rheumatology

## 2020-09-30 ENCOUNTER — Other Ambulatory Visit: Payer: Self-pay | Admitting: Rheumatology

## 2021-08-15 ENCOUNTER — Telehealth: Payer: Self-pay | Admitting: Rheumatology

## 2021-08-15 NOTE — Telephone Encounter (Signed)
Patient is calling to see if doctor would be willing to do injections in right middle finger, right wrist.Patient was released to Rheumatologist at [facility], and has been being treated there ever since. Patient's Rheumatologist at [facility] cannot get her in until after this month, and advised her to call her to see if Dr. Corliss Skains would be wiling to do injections for patient. Please call to advise.

## 2021-08-16 NOTE — Telephone Encounter (Signed)
She will need an appointment to evaluate and perform injection.

## 2021-08-16 NOTE — Telephone Encounter (Signed)
Patient was scheduled for 08/23/2021.

## 2021-08-18 NOTE — Progress Notes (Signed)
Office Visit Note  Patient: Dawn [name]             Date of Birth: 19-May-1981           MRN: [id number]             PCP: [facility] Referring: [facility], [facility] Visit Date: 08/23/2021 Occupation: @GUAROCC @  Subjective:  Right wrist swelling.   History of Present Illness: Jamison Yuhasz is a 40 y.o. female with a history of rheumatoid arthritis.  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  She had injection site reaction with Humira in the past and also fever on Simponi previously while she was in [place].  She was on IV Orencia from 2020 until 10/2019.  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   She was given infliximab IV 5 mg/kg in August 2021 which was discontinued in June 2022 due to severe transfusion reaction. She started having right wrist joint swelling in June 2022 and had right wrist joint injection at [facility].She was started on loading dose of Rituxan in August 2022.  She had been on prednisone 5 mg p.o. daily after the discharge from the hospital February 2021.  The prednisone was discontinued in August when Rituxan was a started.  She continues to have pain and discomfort in her right wrist joint with swelling.  She also describes some discomfort in her right middle finger.  She also had a prednisone taper in June which did not help.  None of the other joints are painful.  Was advised to get a cortisone injection in her wrist joint in [facility] by her rheumatologist at [facility].  Activities of Daily Living:  Patient reports morning stiffness for all day. Patient Reports nocturnal pain.  Difficulty dressing/grooming: Reports Difficulty climbing stairs: Denies Difficulty getting out of chair: Denies Difficulty using hands for taps, buttons, cutlery, and/or writing: Denies  Review of Systems   Constitutional:  Negative for fatigue.  HENT:  Negative for mouth sores, mouth dryness and nose dryness.   Eyes:  Negative for pain, itching and dryness.  Respiratory:  Negative for shortness of breath and difficulty breathing.   Cardiovascular:  Negative for chest pain and palpitations.  Gastrointestinal:  Negative for blood in stool, constipation and diarrhea.  Endocrine: Negative for increased urination.  Genitourinary:  Negative for difficulty urinating.  Musculoskeletal:  Positive for joint pain, joint pain, joint swelling and morning stiffness. Negative for myalgias, muscle tenderness and myalgias.  Skin:  Negative for color change, rash and redness.  Allergic/Immunologic: Negative for susceptible to infections.  Neurological:  Negative for dizziness, numbness, headaches, memory loss and weakness.  Hematological:  Negative for bruising/bleeding tendency.  Psychiatric/Behavioral:  Negative for confusion.    PMFS History:  Patient Active Problem List   Diagnosis Date Noted   Neurological symptoms 12/02/2019   Syncope    Left foot pain 10/04/2018   Sicca syndrome, unspecified 02/21/2017   High risk medication use 01/03/2017   Rheumatoid arthritis (HCC) 06/26/2016   Status post induction of labor 01/13/2015   Term pregnancy 01/13/2015   Spontaneous vaginal delivery 01/13/2015    Past Medical History:  Diagnosis Date   Arthritis    H/O laparoscopy 05/05/13   History of ectopic pregnancy 05/05/13   Hx of unilateral salpingectomy 05/05/13   Left   Hx of wisdom tooth extraction    Medical  history non-contributory     Family History  Problem Relation Age of Onset   Psoriasis Sister    Past Surgical History:  Procedure Laterality Date   BUBBLE STUDY  11/27/2019   Procedure: BUBBLE STUDY;  Surgeon: Pricilla Riffle, MD;  Location: [facility] ENDOSCOPY;  Service: Cardiovascular;;   LAPAROSCOPY N/A 05/06/2013   Procedure: LAPAROSCOPY OPERATIVE, Left Salpingectomy, Removal Ectopic Pregnancy;   Surgeon: Mickel Baas, MD;  Location: [facility] ORS;  Service: Gynecology;  Laterality: N/A;   TEE WITHOUT CARDIOVERSION N/A 11/27/2019   Procedure: TRANSESOPHAGEAL ECHOCARDIOGRAM (TEE);  Surgeon: Pricilla Riffle, MD;  Location: [facility] ENDOSCOPY;  Service: Cardiovascular;  Laterality: N/A;   WISDOM TOOTH EXTRACTION     Social History   Social History Narrative   Not on file   Immunization History  Administered Date(s) Administered   Influenza,inj,Quad PF,6+ Mos 08/13/2019   Tdap 01/14/2015     Objective: Vital Signs: BP (!) 90/59 (BP Location: Left Arm, Patient Position: Sitting, Cuff Size: Normal)   Pulse (!) 106   Ht 5\' 6"  (1.676 m)   Wt 141 lb 9.6 oz (64.2 kg)   BMI 22.85 kg/m    Physical Exam Vitals and nursing note reviewed.  Constitutional:      Appearance: She is well-developed.  HENT:     Head: Normocephalic and atraumatic.  Eyes:     Conjunctiva/sclera: Conjunctivae normal.  Cardiovascular:     Rate and Rhythm: Normal rate and regular rhythm.     Heart sounds: Normal heart sounds.  Pulmonary:     Effort: Pulmonary effort is normal.     Breath sounds: Normal breath sounds.  Abdominal:     General: Bowel sounds are normal.     Palpations: Abdomen is soft.  Musculoskeletal:     Cervical back: Normal range of motion.  Lymphadenopathy:     Cervical: No cervical adenopathy.  Skin:    General: Skin is warm and dry.     Capillary Refill: Capillary refill takes less than 2 seconds.  Neurological:     Mental Status: She is alert and oriented to person, place, and time.  Psychiatric:        Behavior: Behavior normal.     Musculoskeletal Exam: C-spine was in good range of motion.  Shoulder joints, elbow joints were in good range of motion.  She had limited range of motion of her right wrist joint with synovitis in her right wrist joint.  There was no synovitis over MCP joints.  PIPs with good range of motion.  She had synovitis in her right middle finger DIP joint.  Hip joints  and knee joints with good range of motion.  She had no tenderness over ankles or MTPs.  CDAI Exam: CDAI Score: 2.6  Patient Global: 3 mm; Provider Global: 3 mm Swollen: 2 ; Tender: 2  Joint Exam 08/23/2021      Right  Left  Wrist  Swollen Tender     DIP 3  Swollen Tender        Investigation: No additional findings.  Imaging: 13/10/2020 Guided Needle Placement  Result Date: 08/23/2021 Ultrasound guided injection is preferred based studies that show increased duration, increased effect, greater accuracy, decreased procedural pain, increased response rate, and decreased cost with ultrasound guided versus blind injection.   Verbal informed consent obtained.  Time-out conducted.  Noted no overlying erythema, induration, or other signs of local infection. Ultrasound-guided right wrist joint: After sterile prep with Betadine, injected 0.5 mL 1% lidocaine and 30  mg Kenalog using a 27-gauge needle, radiocarpal joint.    US Guided Needle Placement  Result Date: 08/23/2021 Ultrasound guided injection is preferred based studies that show increased duration, increased effect, greater accuracy, decreased procedural pain, increased response rate, and decreased cost with ultrasound guided versus blind injection.   Verbal informed consent obtained.  Time-out conducted.  Noted no overlying erythema, induration, or other signs of local infection. Ultrasound-guided right second DIP injection: After sterile prep with Betadine, injected 0.3 mL lidocaine and 10 mg Kenalog using a 27-gauge needle, needle placement was confirmed in the DIP joint.     Recent Labs: Lab Results  Component Value Date   WBC 8.3 11/27/2019   HGB 10.2 (L) 11/27/2019   PLT 249 11/27/2019   NA 139 11/27/2019   K 3.8 11/27/2019   CL 106 11/27/2019   CO2 25 11/27/2019   GLUCOSE 140 (H) 11/27/2019   BUN 17 11/27/2019   CREATININE 0.46 11/27/2019   BILITOT 0.6 11/18/2019   ALKPHOS 69 11/18/2019   AST 14 (L) 11/18/2019   ALT 11  11/18/2019   PROT 6.2 (L) 11/18/2019   ALBUMIN 3.2 (L) 11/18/2019   CALCIUM 8.3 (L) 11/27/2019   GFRAA >60 11/27/2019   QFTBGOLD NEGATIVE 08/28/2017   QFTBGOLDPLUS Negative 12/10/2018   June 10, 2021 WBC count 12.5, hemoglobin 11.9, platelets 275,CMPNormal  Speciality Comments: Orenvia IV every 4 weeks at [facility](2020-2022). Humira-injection site reaction, Simponi fever, infliximab04/22-06/22-severe transfusion reaction Rituxan June 10, 2021, June 24, 2021  Procedures:  Small Joint Inj: L long DIP on 08/23/2021 4:14 PM Indications: pain and joint swelling Details: 27 G needle, ultrasound-guided dorsal approach  Spinal Needle: No  Medications: 0.3 mL lidocaine 1 %; 10 mg triamcinolone acetonide 40 MG/ML Aspirate: 0 mL Outcome: tolerated well, no immediate complications Procedure, treatment alternatives, risks and benefits explained, specific risks discussed. Consent was given by the patient. Immediately prior to procedure a time out was called to verify the correct patient, procedure, equipment, support staff and site/side marked as required. Patient was prepped and draped in the usual sterile fashion.    Medium Joint Inj: R radiocarpal on 08/23/2021 4:15 PM Indications: pain and joint swelling Details: 27 G 1.5 in needle, ultrasound-guided dorsal approach Medications: 0.5 mL lidocaine 1 %; 30 mg triamcinolone acetonide 40 MG/ML Aspirate: 0 mL Procedure, treatment alternatives, risks and benefits explained, specific risks discussed. Consent was given by the patient. Immediately prior to procedure a time out was called to verify the correct patient, procedure, equipment, support staff and site/side marked as required. Patient was prepped and draped in the usual sterile fashion.    Allergies: Patient has no known allergies.   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  Patient states at that time IV Orencia was discontinued.  She had high-dose prednisone in the hospital and then she was discharged on oral prednisone.  She continued on oral prednisone 5 mg p.o. daily until she had Rituxan in August 2022.  Rheumatoid arthritis with rheumatoid factor of multiple sites without organ or systems involvement (HCC) - +RF, +CCP Ab, erosive disease.  She was initially treated with Humira which was discontinued due to injection site reaction.  While she was on Simponi she developed fever and discontinued the medication.  She did very well on IV Orencia which she started in 2020 and continued  until January 2021.  During the last visit with me in June 2020 we discussed adding methotrexate due to intermittent swelling in her joints.  She was transferred to [facility] and she developed CNS symptoms in January 2021.  In January 2021 when CNS vasculitis was suspected Orencia was discontinued at [facility].  She was started on Remicade IV in April 2021 at [facility] due to joint pain and swelling which she continued until June 2022 until she had severe transfusion reaction.  These dates are given to me by the patient which do not coincide with  [facility] records.  Patient states that she stayed on prednisone 5 mg p.o. daily until August 2022 when she was started on Rituxan.  She had loading dose of Rituxan x2 in August.  She states that she had right wrist joint injection in June 2022 due to ongoing swelling.  It helped for a short duration.  Now she has recurrence of pain and swelling in her right wrist joint.  She also has noticed pain and swelling in her right middle finger DIP joint which she had never experienced before.  She states she has difficulty holding objects and even pulling her pants in the morning.  None of the other joints are painful or swollen.  High risk medication use - Rituxan started  06/10/2021-2nd dose 06/24/2021. ( Infliximab April 2022-June 2022-dcd due to transfusion reaction, Humira-inj. site reaction, Simponi-fever)  Pain in finger of right hand -right second DIP swelling and synovitis was noted on the examination today.  Synovitis in the DIP joint raises concern about possible psoriatic arthritis.  There is positive family history of psoriasis in her sister and paternal grandfather.  Patient had done well on anti-TNF's and Orencia in the past which could have helped both rheumatoid arthritis and psoriatic arthritis.  She never developed psoriasis.  She has been complaining of increased aches and pains in the trochanteric area and epicondyle region recently.  She denies any history of Achilles tendinitis or plantar fasciitis.  I have advised her to discuss this further with Dr. Elijah Birk at the follow-up visit.  Per patient's request right second DIP joint was injected with lidocaine and cortisone as described above.  Side effects from the cortisone injection were discussed.  Patient tolerated the procedure well.  Postprocedure instructions were given and a splint was applied to be used for the next 3 days.  Plan: US Guided Needle Placement  Pain in right wrist -she had synovitis and limited range of motion of her right wrist joint.  She requests cortisone injection.  Informed consent was obtained and side effects were discussed.  The procedure was described above.  Patient tolerated the procedure well.  Postprocedure instructions were given.  She was advised to use a wrist splint for the next few days.  Plan: US Guided Needle Placement  Family history of psoriasis - Sister, Paternal grandfather  Former smoker  Orders: Orders Placed This Encounter  Procedures   Small Joint Inj   Medium Joint Inj   US Guided Needle Placement   US Guided Needle Placement    No orders of the defined types were placed in this encounter.   Face-to-face time spent with patient was 50  minutes. Greater than 50% of time was spent in counseling and coordination of care.  Follow-Up Instructions: Return if symptoms worsen or fail to improve, for Rheumatoid arthritis.   Pollyann Savoy, MD  Note - This record has been created using Animal nutritionist.  Chart creation errors  have been sought, but may not always  have been located. Such creation errors do not reflect on  the standard of medical care.

## 2021-08-23 ENCOUNTER — Ambulatory Visit: Payer: Self-pay

## 2021-08-23 ENCOUNTER — Encounter: Payer: Self-pay | Admitting: Rheumatology

## 2021-08-23 ENCOUNTER — Other Ambulatory Visit: Payer: Self-pay

## 2021-08-23 ENCOUNTER — Ambulatory Visit (INDEPENDENT_AMBULATORY_CARE_PROVIDER_SITE_OTHER): Payer: BC Managed Care – PPO | Admitting: Rheumatology

## 2021-08-23 VITALS — BP 90/59 | HR 106 | Ht 66.0 in | Wt 141.6 lb

## 2021-08-23 DIAGNOSIS — Z79899 Other long term (current) drug therapy: Secondary | ICD-10-CM

## 2021-08-23 DIAGNOSIS — M25531 Pain in right wrist: Secondary | ICD-10-CM

## 2021-08-23 DIAGNOSIS — M79644 Pain in right finger(s): Secondary | ICD-10-CM | POA: Diagnosis not present

## 2021-08-23 DIAGNOSIS — I776 Arteritis, unspecified: Secondary | ICD-10-CM

## 2021-08-23 DIAGNOSIS — Z87891 Personal history of nicotine dependence: Secondary | ICD-10-CM

## 2021-08-23 DIAGNOSIS — M0579 Rheumatoid arthritis with rheumatoid factor of multiple sites without organ or systems involvement: Secondary | ICD-10-CM

## 2021-08-23 DIAGNOSIS — Z840 Family history of diseases of the skin and subcutaneous tissue: Secondary | ICD-10-CM

## 2021-08-23 MED ORDER — TRIAMCINOLONE ACETONIDE 40 MG/ML IJ SUSP
30.0000 mg | INTRAMUSCULAR | Status: AC | PRN
  Administered 2021-08-23: 30 mg via INTRA_ARTICULAR

## 2021-08-23 MED ORDER — LIDOCAINE HCL 1 % IJ SOLN
0.3000 mL | INTRAMUSCULAR | Status: AC | PRN
Start: 2021-08-23 — End: 2021-08-23
  Administered 2021-08-23: .3 mL

## 2021-08-23 MED ORDER — TRIAMCINOLONE ACETONIDE 40 MG/ML IJ SUSP
10.0000 mg | INTRAMUSCULAR | Status: AC | PRN
Start: 2021-08-23 — End: 2021-08-23
  Administered 2021-08-23: 10 mg via INTRA_ARTICULAR

## 2021-08-23 MED ORDER — LIDOCAINE HCL 1 % IJ SOLN
0.5000 mL | INTRAMUSCULAR | Status: AC | PRN
  Administered 2021-08-23: .5 mL

## 2023-11-05 ENCOUNTER — Emergency Department (HOSPITAL_BASED_OUTPATIENT_CLINIC_OR_DEPARTMENT_OTHER): Payer: BLUE CROSS/BLUE SHIELD

## 2023-11-05 ENCOUNTER — Encounter (HOSPITAL_BASED_OUTPATIENT_CLINIC_OR_DEPARTMENT_OTHER): Payer: Self-pay | Admitting: Emergency Medicine

## 2023-11-05 ENCOUNTER — Other Ambulatory Visit: Payer: Self-pay

## 2023-11-05 ENCOUNTER — Emergency Department (HOSPITAL_BASED_OUTPATIENT_CLINIC_OR_DEPARTMENT_OTHER)
Admission: EM | Admit: 2023-11-05 | Discharge: 2023-11-06 | Disposition: A | Discharge status: Home / Self Care | Payer: BLUE CROSS/BLUE SHIELD | Attending: Emergency Medicine | Admitting: Emergency Medicine

## 2023-11-05 DIAGNOSIS — R109 Unspecified abdominal pain: Secondary | ICD-10-CM | POA: Insufficient documentation

## 2023-11-05 DIAGNOSIS — R0781 Pleurodynia: Secondary | ICD-10-CM | POA: Diagnosis present

## 2023-11-05 HISTORY — DX: Rheumatoid arthritis, unspecified: M06.9

## 2023-11-05 MED ORDER — LIDOCAINE 5 % EX PTCH: 1.0000 | MEDICATED_PATCH | CUTANEOUS | 0 refills | Status: AC

## 2023-11-05 MED ORDER — ACETAMINOPHEN 500 MG PO TABS
1000.0000 mg | ORAL_TABLET | Freq: Once | ORAL | Status: AC
  Administered 2023-11-05: 1000 mg via ORAL
  Filled 2023-11-05: qty 2

## 2023-11-05 MED ORDER — OXYCODONE HCL 5 MG PO TABS: 5.0000 mg | ORAL_TABLET | ORAL | 0 refills | Status: AC | PRN

## 2023-11-05 NOTE — Discharge Instructions (Addendum)
 You were seen in the emergency room for right rib pain.  An x-ray was taken which did not show any acute abnormality.  A short supply of oxycodone  and lidocaine  patches were sent into your pharmacy.  Please avoid driving or operating heavy machinery while using muscle relaxers.  You can additionally alternate Tylenol  (1000 mg) and ibuprofen  (600 mg) for pain.  If you experience any new or worsening symptoms including persistent productive cough, fevers, chills, vomiting, blood or pain with urinating please return to the emergency room.

## 2023-11-05 NOTE — ED Triage Notes (Signed)
 Right rib/ abdo flank pain Started a few days ago. First notice after coughing but now feels like it is getting worse in the last few hours.  Took ibuprofen around 3pm, no relief

## 2023-11-05 NOTE — ED Provider Notes (Signed)
 [place] EMERGENCY DEPARTMENT AT [facility] Provider Note   CSN: [id number] Arrival date & time: 11/05/23  1812     History  Chief Complaint  Patient presents with   Abdominal Pain    Dawn [name] is a 43 y.o. female who presents with right-sided rib pain that started 4 days ago.  She reports that she has had a cough recently and just finished treatment for pneumonia.  She is concerned she had a rib from coughing so hard.  She notes her pain has been worsening.  Describes pain with movement.  Denies any vomiting, diarrhea, hematuria, dysuria, fevers or chills.   Abdominal Pain      Home Medications Prior to Admission medications   Medication Sig Start Date End Date Taking? Authorizing Provider  acetaminophen  (TYLENOL ) 325 MG tablet Take 3 tablets (975 mg total) by mouth every 8 (eight) hours as needed for mild pain, fever or headache. Take no more than 4000mg  of Tylenol  per day 11/21/19   Marjo Nicky BIRCH, MD  lidocaine  (LIDODERM ) 5 % Place 1 patch onto the skin daily. Remove & Discard patch within 12 hours or as directed by MD 11/05/23  Yes Donnajean Lynwood DEL, PA-C  linezolid  (ZYVOX ) 600 MG tablet Take 1 tablet (600 mg total) by mouth 2 (two) times daily. 11/29/19   Rizwan, Saima, MD  oxyCODONE  (ROXICODONE ) 5 MG immediate release tablet Take 1 tablet (5 mg total) by mouth every 4 (four) hours as needed for severe pain (pain score 7-10). 11/05/23  Yes Donnajean Lynwood DEL, PA-C  pantoprazole  (PROTONIX ) 40 MG tablet Take 1 tablet (40 mg total) by mouth daily. 11/22/19   Marjo Nicky BIRCH, MD  predniSONE  (DELTASONE ) 20 MG tablet Take 3 tablets (60 mg total) by mouth daily with breakfast. Patient not taking: Reported on 08/23/2021 12/01/19   Dolphus Reiter, MD  promethazine  (PHENERGAN ) 12.5 MG tablet Take 1 tablet (12.5 mg total) by mouth every 8 (eight) hours as needed for nausea or vomiting. Alternate with Zofran  if needed 11/21/19   Kennerly, Solianny D, MD  riTUXimab  (RITUXAN IV) Inject into the vein.    [provider]  simethicone  (MYLICON) 80 MG chewable tablet Chew 1 tablet (80 mg total) by mouth 4 (four) times daily as needed (stomach gas/cramp). 11/21/19   Marjo Nicky BIRCH, MD  SUMAtriptan  (IMITREX ) 50 MG tablet Take 1 tablet (50 mg total) by mouth every 2 (two) hours as needed (persistent headache). May repeat in 2 hours if headache persists or recurs. Up to 200mg  in 24 hours. 11/21/19 11/21/20  Marjo Nicky BIRCH, MD      Allergies    Patient has no known allergies.    Review of Systems   Review of Systems  Gastrointestinal:  Positive for abdominal pain.    Physical Exam Updated Vital Signs BP 97/64   Pulse 77   Temp 98.2 F (36.8 C)   Resp 18   LMP 10/22/2023   SpO2 100%  Physical Exam Vitals and nursing note reviewed.  Constitutional:      General: She is not in acute distress.    Appearance: She is well-developed.  HENT:     Head: Normocephalic and atraumatic.  Eyes:     Conjunctiva/sclera: Conjunctivae normal.  Cardiovascular:     Rate and Rhythm: Normal rate and regular rhythm.     Heart sounds: No murmur heard. Pulmonary:     Effort: Pulmonary effort is normal. No respiratory distress.     Breath sounds: Normal breath  sounds.  Abdominal:     Palpations: Abdomen is soft.     Tenderness: There is no right CVA tenderness or left CVA tenderness. Negative signs include Murphy's sign.  Musculoskeletal:        General: No swelling.     Cervical back: Neck supple.     Comments: Tenderness to right lower ribs, no gross deformity, ecchymosis or swelling no overlying rash or lesions.  Skin:    General: Skin is warm and dry.     Capillary Refill: Capillary refill takes less than 2 seconds.  Neurological:     Mental Status: She is alert.  Psychiatric:        Mood and Affect: Mood normal.     ED Results / Procedures / Treatments   Labs (all labs ordered are listed, but only abnormal results are displayed) Labs  Reviewed - No data to display  EKG None  Radiology DG Ribs Unilateral W/Chest Right Result Date: 11/05/2023 CLINICAL DATA:  Right rib pain. EXAM: RIGHT RIBS AND CHEST - 3+ VIEW COMPARISON:  Chest radiograph 11/18/2019 FINDINGS: No fracture or other bone lesions are seen involving the ribs. There is no pneumothorax or pleural effusion. Both lungs are clear. Heart size and mediastinal contours are within normal limits. IMPRESSION: No rib abnormality.  Clear lungs. Electronically Signed   By: Andrea Gasman M.D.   On: 11/05/2023 23:32    Procedures Procedures    Medications Ordered in ED Medications  acetaminophen  (TYLENOL ) tablet 1,000 mg (1,000 mg Oral Given 11/05/23 2252)    ED Course/ Medical Decision Making/ A&P                                 Medical Decision Making  This patient presents to the ED with chief complaint(s) of right rib pain.  The complaint involves an extensive differential diagnosis and also carries with it a high risk of complications and morbidity.   pertinent past medical history as listed in HPI  The differential diagnosis includes  Cholecystitis, nephrolithiasis, pyelonephritis, shingles, musculoskeletal The initial plan is to  Will obtain x-rays of right rib, pain control Additional history obtained: Additional history obtained from spouse Records reviewed previous admission documents and Care Everywhere/External Records  Initial Assessment:   Hemodynamically stable, afebrile, nontoxic-appearing patient presenting with atraumatic right rib pain x 4 days ago.  Abdominal exam is completely benign.  Do not suspect cholecystitis.  With normal vitals and without urinary symptoms do not suspect kidney stone or pyelonephritis.  No overlying lesion or rash to suggest shingles.  Overall suspect musculoskeletal etiology likely from heavy coughing from recent URI.   Independent ECG interpretation:  none  Independent labs interpretation:  The following labs  were independently interpreted:  none  Independent visualization and interpretation of imaging: I independently visualized the following imaging with scope of interpretation limited to determining acute life threatening conditions related to emergency care: Right rib x-ray, which revealed no rib abnormality  Treatment and Reassessment: Patient given 1000 mg of Tylenol  following first assessment  Consultations obtained:   none  Disposition:   Patient discharged home with short supply of oxycodone  and lidocaine  patches.  Encouraged to follow-up with primary care doctor should her symptoms persist. The patient has been appropriately medically screened and/or stabilized in the ED. I have low suspicion for any other emergent medical condition which would require further screening, evaluation or treatment in the ED or require inpatient management.  At time of discharge the patient is hemodynamically stable and in no acute distress. I have discussed work-up results and diagnosis with patient and answered all questions. Patient is agreeable with discharge plan. We discussed strict return precautions for returning to the emergency department and they verbalized understanding.     Social Determinants of Health:   none  This note was dictated with voice recognition software.  Despite best efforts at proofreading, errors may have occurred which can change the documentation meaning.            Final Clinical Impression(s) / ED Diagnoses Final diagnoses:  Rib pain on right side    Rx / DC Orders ED Discharge Orders          Ordered    lidocaine  (LIDODERM ) 5 %  Every 24 hours        11/05/23 2353    oxyCODONE  (ROXICODONE ) 5 MG immediate release tablet  Every 4 hours PRN        11/05/23 2353              Donnajean Lynwood DEL, PA-C 11/05/23 2353    Franklyn Sid SAILOR, MD 11/06/23 [phone]

## 2023-11-06 NOTE — ED Notes (Signed)
